# Patient Record
Sex: Female | Born: 1935 | Race: White | Hispanic: No | Smoking: Former smoker
Health system: Southern US, Community
[De-identification: ages and names within clinical notes are randomized; demographics above are authoritative.]

## PROBLEM LIST (undated history)

## (undated) DIAGNOSIS — K449 Diaphragmatic hernia without obstruction or gangrene: Secondary | ICD-10-CM

## (undated) DIAGNOSIS — K219 Gastro-esophageal reflux disease without esophagitis: Secondary | ICD-10-CM

## (undated) DIAGNOSIS — K7581 Nonalcoholic steatohepatitis (NASH): Secondary | ICD-10-CM

## (undated) DIAGNOSIS — K623 Rectal prolapse: Secondary | ICD-10-CM

## (undated) DIAGNOSIS — I639 Cerebral infarction, unspecified: Secondary | ICD-10-CM

## (undated) DIAGNOSIS — I83019 Varicose veins of right lower extremity with ulcer of unspecified site: Secondary | ICD-10-CM

## (undated) DIAGNOSIS — R7303 Prediabetes: Secondary | ICD-10-CM

## (undated) DIAGNOSIS — E785 Hyperlipidemia, unspecified: Secondary | ICD-10-CM

## (undated) DIAGNOSIS — I8500 Esophageal varices without bleeding: Secondary | ICD-10-CM

## (undated) DIAGNOSIS — D509 Iron deficiency anemia, unspecified: Secondary | ICD-10-CM

## (undated) DIAGNOSIS — I10 Essential (primary) hypertension: Secondary | ICD-10-CM

## (undated) DIAGNOSIS — Z9282 Status post administration of tPA (rtPA) in a different facility within the last 24 hours prior to admission to current facility: Secondary | ICD-10-CM

## (undated) DIAGNOSIS — R339 Retention of urine, unspecified: Secondary | ICD-10-CM

## (undated) DIAGNOSIS — R270 Ataxia, unspecified: Secondary | ICD-10-CM

## (undated) DIAGNOSIS — L97919 Non-pressure chronic ulcer of unspecified part of right lower leg with unspecified severity: Secondary | ICD-10-CM

## (undated) DIAGNOSIS — K5901 Slow transit constipation: Secondary | ICD-10-CM

## (undated) DIAGNOSIS — I6622 Occlusion and stenosis of left posterior cerebral artery: Secondary | ICD-10-CM

## (undated) DIAGNOSIS — N390 Urinary tract infection, site not specified: Secondary | ICD-10-CM

## (undated) DIAGNOSIS — E039 Hypothyroidism, unspecified: Secondary | ICD-10-CM

## (undated) DIAGNOSIS — J441 Chronic obstructive pulmonary disease with (acute) exacerbation: Secondary | ICD-10-CM

## (undated) DIAGNOSIS — M1990 Unspecified osteoarthritis, unspecified site: Secondary | ICD-10-CM

## (undated) DIAGNOSIS — E8809 Other disorders of plasma-protein metabolism, not elsewhere classified: Secondary | ICD-10-CM

## (undated) DIAGNOSIS — K7460 Unspecified cirrhosis of liver: Secondary | ICD-10-CM

## (undated) DIAGNOSIS — E46 Unspecified protein-calorie malnutrition: Secondary | ICD-10-CM

## (undated) DIAGNOSIS — R3129 Other microscopic hematuria: Secondary | ICD-10-CM

## (undated) DIAGNOSIS — G629 Polyneuropathy, unspecified: Secondary | ICD-10-CM

## (undated) DIAGNOSIS — Z86010 Personal history of colonic polyps: Secondary | ICD-10-CM

## (undated) DIAGNOSIS — K5790 Diverticulosis of intestine, part unspecified, without perforation or abscess without bleeding: Secondary | ICD-10-CM

## (undated) DIAGNOSIS — K754 Autoimmune hepatitis: Secondary | ICD-10-CM

## (undated) DIAGNOSIS — D649 Anemia, unspecified: Secondary | ICD-10-CM

## (undated) DIAGNOSIS — K31819 Angiodysplasia of stomach and duodenum without bleeding: Secondary | ICD-10-CM

## (undated) DIAGNOSIS — I509 Heart failure, unspecified: Secondary | ICD-10-CM

## (undated) DIAGNOSIS — R1110 Vomiting, unspecified: Secondary | ICD-10-CM

## (undated) DIAGNOSIS — B159 Hepatitis A without hepatic coma: Secondary | ICD-10-CM

## (undated) DIAGNOSIS — F32A Depression, unspecified: Secondary | ICD-10-CM

## (undated) DIAGNOSIS — J449 Chronic obstructive pulmonary disease, unspecified: Secondary | ICD-10-CM

## (undated) DIAGNOSIS — N182 Chronic kidney disease, stage 2 (mild): Secondary | ICD-10-CM

## (undated) DIAGNOSIS — R195 Other fecal abnormalities: Secondary | ICD-10-CM

## (undated) DIAGNOSIS — D638 Anemia in other chronic diseases classified elsewhere: Secondary | ICD-10-CM

## (undated) HISTORY — DX: Essential (primary) hypertension: I10

## (undated) HISTORY — DX: Unspecified cirrhosis of liver: K74.60

## (undated) HISTORY — PX: POLYPECTOMY: SHX149

## (undated) HISTORY — PX: OTHER SURGICAL HISTORY: SHX169

## (undated) HISTORY — DX: Status post administration of tPA (rtPA) in a different facility within the last 24 hours prior to admission to current facility: Z92.82

## (undated) HISTORY — DX: Autoimmune hepatitis: K75.4

## (undated) HISTORY — DX: Chronic obstructive pulmonary disease, unspecified: J44.9

## (undated) HISTORY — PX: AMPUTATION TOE: SHX6595

## (undated) HISTORY — DX: Retention of urine, unspecified: R33.9

## (undated) HISTORY — DX: Angiodysplasia of stomach and duodenum without bleeding: K31.819

## (undated) HISTORY — DX: Hypothyroidism, unspecified: E03.9

## (undated) HISTORY — DX: Diaphragmatic hernia without obstruction or gangrene: K44.9

## (undated) HISTORY — DX: Esophageal varices without bleeding: I85.00

## (undated) HISTORY — DX: Chronic kidney disease, stage 2 (mild): N18.2

## (undated) HISTORY — DX: Other fecal abnormalities: R19.5

## (undated) HISTORY — DX: Personal history of colonic polyps: Z86.010

## (undated) HISTORY — DX: Varicose veins of right lower extremity with ulcer of unspecified site: I83.019

## (undated) HISTORY — DX: Anemia in other chronic diseases classified elsewhere: D63.8

## (undated) HISTORY — DX: Vomiting, unspecified: R11.10

## (undated) HISTORY — PX: BREAST SURGERY: SHX581

## (undated) HISTORY — DX: Rectal prolapse: K62.3

## (undated) HISTORY — DX: Occlusion and stenosis of left posterior cerebral artery: I66.22

## (undated) HISTORY — DX: Varicose veins of right lower extremity with ulcer of unspecified site: L97.919

## (undated) HISTORY — DX: Iron deficiency anemia, unspecified: D50.9

## (undated) HISTORY — DX: Cerebral infarction, unspecified: I63.9

## (undated) HISTORY — DX: Prediabetes: R73.03

## (undated) HISTORY — DX: Chronic obstructive pulmonary disease with (acute) exacerbation: J44.1

## (undated) HISTORY — DX: Unspecified protein-calorie malnutrition: E46

## (undated) HISTORY — DX: Other disorders of plasma-protein metabolism, not elsewhere classified: E88.09

## (undated) HISTORY — DX: Heart failure, unspecified: I50.9

## (undated) HISTORY — DX: Slow transit constipation: K59.01

## (undated) HISTORY — DX: Polyneuropathy, unspecified: G62.9

## (undated) HISTORY — DX: Hyperlipidemia, unspecified: E78.5

## (undated) HISTORY — DX: Ataxia, unspecified: R27.0

## (undated) HISTORY — DX: Depression, unspecified: F32.A

---
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
## 1961-06-23 DIAGNOSIS — B159 Hepatitis A without hepatic coma: Secondary | ICD-10-CM

## 1961-06-23 HISTORY — DX: Hepatitis a without hepatic coma: B15.9

## 2010-06-23 HISTORY — PX: ABDOMINAL HYSTERECTOMY: SHX81

## 2011-06-24 HISTORY — PX: CHOLECYSTECTOMY: SHX55

## 2011-09-04 DIAGNOSIS — J449 Chronic obstructive pulmonary disease, unspecified: Secondary | ICD-10-CM | POA: Diagnosis not present

## 2011-09-04 DIAGNOSIS — E782 Mixed hyperlipidemia: Secondary | ICD-10-CM | POA: Diagnosis not present

## 2011-09-04 DIAGNOSIS — L570 Actinic keratosis: Secondary | ICD-10-CM | POA: Diagnosis not present

## 2011-09-04 DIAGNOSIS — I10 Essential (primary) hypertension: Secondary | ICD-10-CM | POA: Diagnosis not present

## 2011-09-17 DIAGNOSIS — L570 Actinic keratosis: Secondary | ICD-10-CM | POA: Diagnosis not present

## 2011-10-02 DIAGNOSIS — H524 Presbyopia: Secondary | ICD-10-CM | POA: Diagnosis not present

## 2011-10-30 DIAGNOSIS — J3089 Other allergic rhinitis: Secondary | ICD-10-CM | POA: Diagnosis not present

## 2011-10-30 DIAGNOSIS — G4700 Insomnia, unspecified: Secondary | ICD-10-CM | POA: Diagnosis not present

## 2011-10-30 DIAGNOSIS — Z7189 Other specified counseling: Secondary | ICD-10-CM | POA: Diagnosis not present

## 2011-11-13 DIAGNOSIS — J310 Chronic rhinitis: Secondary | ICD-10-CM | POA: Diagnosis not present

## 2011-11-13 DIAGNOSIS — J449 Chronic obstructive pulmonary disease, unspecified: Secondary | ICD-10-CM | POA: Diagnosis not present

## 2012-01-01 DIAGNOSIS — J449 Chronic obstructive pulmonary disease, unspecified: Secondary | ICD-10-CM | POA: Diagnosis not present

## 2012-01-06 DIAGNOSIS — Z23 Encounter for immunization: Secondary | ICD-10-CM | POA: Diagnosis not present

## 2012-01-06 DIAGNOSIS — I10 Essential (primary) hypertension: Secondary | ICD-10-CM | POA: Diagnosis not present

## 2012-01-06 DIAGNOSIS — E782 Mixed hyperlipidemia: Secondary | ICD-10-CM | POA: Diagnosis not present

## 2012-01-26 DIAGNOSIS — Z79899 Other long term (current) drug therapy: Secondary | ICD-10-CM | POA: Diagnosis not present

## 2012-02-06 DIAGNOSIS — K8020 Calculus of gallbladder without cholecystitis without obstruction: Secondary | ICD-10-CM | POA: Diagnosis not present

## 2012-02-06 DIAGNOSIS — R7989 Other specified abnormal findings of blood chemistry: Secondary | ICD-10-CM | POA: Diagnosis not present

## 2012-02-06 DIAGNOSIS — R748 Abnormal levels of other serum enzymes: Secondary | ICD-10-CM | POA: Diagnosis not present

## 2012-02-06 DIAGNOSIS — R160 Hepatomegaly, not elsewhere classified: Secondary | ICD-10-CM | POA: Diagnosis not present

## 2012-02-13 DIAGNOSIS — R1901 Right upper quadrant abdominal swelling, mass and lump: Secondary | ICD-10-CM | POA: Diagnosis not present

## 2012-02-13 DIAGNOSIS — K8020 Calculus of gallbladder without cholecystitis without obstruction: Secondary | ICD-10-CM | POA: Diagnosis not present

## 2012-02-19 DIAGNOSIS — J4489 Other specified chronic obstructive pulmonary disease: Secondary | ICD-10-CM | POA: Diagnosis not present

## 2012-02-19 DIAGNOSIS — J449 Chronic obstructive pulmonary disease, unspecified: Secondary | ICD-10-CM | POA: Diagnosis not present

## 2012-02-19 DIAGNOSIS — Z0000 Encounter for general adult medical examination without abnormal findings: Secondary | ICD-10-CM | POA: Diagnosis not present

## 2012-02-19 DIAGNOSIS — Z1231 Encounter for screening mammogram for malignant neoplasm of breast: Secondary | ICD-10-CM | POA: Diagnosis not present

## 2012-02-19 DIAGNOSIS — Z1210 Encounter for screening for malignant neoplasm of intestinal tract, unspecified: Secondary | ICD-10-CM | POA: Diagnosis not present

## 2012-02-19 DIAGNOSIS — K8010 Calculus of gallbladder with chronic cholecystitis without obstruction: Secondary | ICD-10-CM | POA: Diagnosis not present

## 2012-02-19 DIAGNOSIS — K8070 Calculus of gallbladder and bile duct without cholecystitis without obstruction: Secondary | ICD-10-CM | POA: Diagnosis not present

## 2012-02-20 DIAGNOSIS — J449 Chronic obstructive pulmonary disease, unspecified: Secondary | ICD-10-CM | POA: Diagnosis not present

## 2012-02-20 DIAGNOSIS — R9431 Abnormal electrocardiogram [ECG] [EKG]: Secondary | ICD-10-CM | POA: Diagnosis not present

## 2012-02-20 DIAGNOSIS — K8020 Calculus of gallbladder without cholecystitis without obstruction: Secondary | ICD-10-CM | POA: Diagnosis not present

## 2012-02-20 DIAGNOSIS — Z01818 Encounter for other preprocedural examination: Secondary | ICD-10-CM | POA: Diagnosis not present

## 2012-02-20 DIAGNOSIS — I10 Essential (primary) hypertension: Secondary | ICD-10-CM | POA: Diagnosis not present

## 2012-02-27 DIAGNOSIS — I10 Essential (primary) hypertension: Secondary | ICD-10-CM | POA: Diagnosis not present

## 2012-02-27 DIAGNOSIS — K8010 Calculus of gallbladder with chronic cholecystitis without obstruction: Secondary | ICD-10-CM | POA: Diagnosis not present

## 2012-02-27 DIAGNOSIS — J449 Chronic obstructive pulmonary disease, unspecified: Secondary | ICD-10-CM | POA: Diagnosis not present

## 2012-02-27 DIAGNOSIS — K819 Cholecystitis, unspecified: Secondary | ICD-10-CM | POA: Diagnosis not present

## 2012-02-27 DIAGNOSIS — E785 Hyperlipidemia, unspecified: Secondary | ICD-10-CM | POA: Diagnosis not present

## 2012-02-27 DIAGNOSIS — Z87891 Personal history of nicotine dependence: Secondary | ICD-10-CM | POA: Diagnosis not present

## 2012-03-19 DIAGNOSIS — K759 Inflammatory liver disease, unspecified: Secondary | ICD-10-CM | POA: Diagnosis not present

## 2012-03-19 DIAGNOSIS — Z1231 Encounter for screening mammogram for malignant neoplasm of breast: Secondary | ICD-10-CM | POA: Diagnosis not present

## 2012-03-19 DIAGNOSIS — Z23 Encounter for immunization: Secondary | ICD-10-CM | POA: Diagnosis not present

## 2012-05-10 DIAGNOSIS — E559 Vitamin D deficiency, unspecified: Secondary | ICD-10-CM | POA: Diagnosis not present

## 2012-05-10 DIAGNOSIS — E782 Mixed hyperlipidemia: Secondary | ICD-10-CM | POA: Diagnosis not present

## 2012-05-10 DIAGNOSIS — I10 Essential (primary) hypertension: Secondary | ICD-10-CM | POA: Diagnosis not present

## 2012-05-10 DIAGNOSIS — J449 Chronic obstructive pulmonary disease, unspecified: Secondary | ICD-10-CM | POA: Diagnosis not present

## 2012-05-31 DIAGNOSIS — E559 Vitamin D deficiency, unspecified: Secondary | ICD-10-CM | POA: Diagnosis not present

## 2012-05-31 DIAGNOSIS — E782 Mixed hyperlipidemia: Secondary | ICD-10-CM | POA: Diagnosis not present

## 2012-08-16 DIAGNOSIS — E782 Mixed hyperlipidemia: Secondary | ICD-10-CM | POA: Diagnosis not present

## 2012-08-16 DIAGNOSIS — J449 Chronic obstructive pulmonary disease, unspecified: Secondary | ICD-10-CM | POA: Diagnosis not present

## 2012-09-03 DIAGNOSIS — E785 Hyperlipidemia, unspecified: Secondary | ICD-10-CM | POA: Diagnosis not present

## 2012-09-03 DIAGNOSIS — F411 Generalized anxiety disorder: Secondary | ICD-10-CM | POA: Diagnosis not present

## 2013-01-03 DIAGNOSIS — E782 Mixed hyperlipidemia: Secondary | ICD-10-CM | POA: Diagnosis not present

## 2013-01-03 DIAGNOSIS — I10 Essential (primary) hypertension: Secondary | ICD-10-CM | POA: Diagnosis not present

## 2013-02-02 DIAGNOSIS — Z1331 Encounter for screening for depression: Secondary | ICD-10-CM | POA: Diagnosis not present

## 2013-02-02 DIAGNOSIS — I10 Essential (primary) hypertension: Secondary | ICD-10-CM | POA: Diagnosis not present

## 2013-02-02 DIAGNOSIS — Z139 Encounter for screening, unspecified: Secondary | ICD-10-CM | POA: Diagnosis not present

## 2013-02-02 DIAGNOSIS — E782 Mixed hyperlipidemia: Secondary | ICD-10-CM | POA: Diagnosis not present

## 2013-02-02 DIAGNOSIS — J449 Chronic obstructive pulmonary disease, unspecified: Secondary | ICD-10-CM | POA: Diagnosis not present

## 2013-02-15 DIAGNOSIS — J449 Chronic obstructive pulmonary disease, unspecified: Secondary | ICD-10-CM | POA: Diagnosis not present

## 2013-03-03 DIAGNOSIS — Z23 Encounter for immunization: Secondary | ICD-10-CM | POA: Diagnosis not present

## 2013-03-14 DIAGNOSIS — D485 Neoplasm of uncertain behavior of skin: Secondary | ICD-10-CM | POA: Diagnosis not present

## 2013-03-14 DIAGNOSIS — L578 Other skin changes due to chronic exposure to nonionizing radiation: Secondary | ICD-10-CM | POA: Diagnosis not present

## 2013-03-14 DIAGNOSIS — L570 Actinic keratosis: Secondary | ICD-10-CM | POA: Diagnosis not present

## 2013-04-20 DIAGNOSIS — C4490 Unspecified malignant neoplasm of skin, unspecified: Secondary | ICD-10-CM | POA: Diagnosis not present

## 2013-07-05 DIAGNOSIS — I10 Essential (primary) hypertension: Secondary | ICD-10-CM | POA: Diagnosis not present

## 2013-07-05 DIAGNOSIS — E782 Mixed hyperlipidemia: Secondary | ICD-10-CM | POA: Diagnosis not present

## 2013-07-21 DIAGNOSIS — E782 Mixed hyperlipidemia: Secondary | ICD-10-CM | POA: Diagnosis not present

## 2013-07-21 DIAGNOSIS — Z1331 Encounter for screening for depression: Secondary | ICD-10-CM | POA: Diagnosis not present

## 2013-07-21 DIAGNOSIS — I10 Essential (primary) hypertension: Secondary | ICD-10-CM | POA: Diagnosis not present

## 2013-07-21 DIAGNOSIS — IMO0002 Reserved for concepts with insufficient information to code with codable children: Secondary | ICD-10-CM | POA: Diagnosis not present

## 2013-07-21 DIAGNOSIS — Z139 Encounter for screening, unspecified: Secondary | ICD-10-CM | POA: Diagnosis not present

## 2013-07-21 DIAGNOSIS — Z0000 Encounter for general adult medical examination without abnormal findings: Secondary | ICD-10-CM | POA: Diagnosis not present

## 2013-07-21 DIAGNOSIS — J449 Chronic obstructive pulmonary disease, unspecified: Secondary | ICD-10-CM | POA: Diagnosis not present

## 2013-11-10 DIAGNOSIS — I10 Essential (primary) hypertension: Secondary | ICD-10-CM | POA: Diagnosis not present

## 2013-11-10 DIAGNOSIS — E782 Mixed hyperlipidemia: Secondary | ICD-10-CM | POA: Diagnosis not present

## 2013-11-17 DIAGNOSIS — IMO0002 Reserved for concepts with insufficient information to code with codable children: Secondary | ICD-10-CM | POA: Diagnosis not present

## 2013-11-17 DIAGNOSIS — E782 Mixed hyperlipidemia: Secondary | ICD-10-CM | POA: Diagnosis not present

## 2013-11-17 DIAGNOSIS — R748 Abnormal levels of other serum enzymes: Secondary | ICD-10-CM | POA: Diagnosis not present

## 2013-11-17 DIAGNOSIS — I10 Essential (primary) hypertension: Secondary | ICD-10-CM | POA: Diagnosis not present

## 2013-11-21 DIAGNOSIS — IMO0002 Reserved for concepts with insufficient information to code with codable children: Secondary | ICD-10-CM | POA: Diagnosis not present

## 2013-11-21 DIAGNOSIS — N390 Urinary tract infection, site not specified: Secondary | ICD-10-CM | POA: Diagnosis not present

## 2013-11-21 DIAGNOSIS — G589 Mononeuropathy, unspecified: Secondary | ICD-10-CM | POA: Diagnosis not present

## 2013-11-21 DIAGNOSIS — N8111 Cystocele, midline: Secondary | ICD-10-CM | POA: Diagnosis not present

## 2013-12-01 DIAGNOSIS — M47814 Spondylosis without myelopathy or radiculopathy, thoracic region: Secondary | ICD-10-CM | POA: Diagnosis not present

## 2013-12-01 DIAGNOSIS — M546 Pain in thoracic spine: Secondary | ICD-10-CM | POA: Diagnosis not present

## 2013-12-01 DIAGNOSIS — IMO0002 Reserved for concepts with insufficient information to code with codable children: Secondary | ICD-10-CM | POA: Diagnosis not present

## 2013-12-08 DIAGNOSIS — IMO0002 Reserved for concepts with insufficient information to code with codable children: Secondary | ICD-10-CM | POA: Diagnosis not present

## 2014-03-29 DIAGNOSIS — Z23 Encounter for immunization: Secondary | ICD-10-CM | POA: Diagnosis not present

## 2014-04-12 DIAGNOSIS — E782 Mixed hyperlipidemia: Secondary | ICD-10-CM | POA: Diagnosis not present

## 2014-04-12 DIAGNOSIS — I10 Essential (primary) hypertension: Secondary | ICD-10-CM | POA: Diagnosis not present

## 2014-04-19 DIAGNOSIS — J449 Chronic obstructive pulmonary disease, unspecified: Secondary | ICD-10-CM | POA: Diagnosis not present

## 2014-04-19 DIAGNOSIS — J411 Mucopurulent chronic bronchitis: Secondary | ICD-10-CM | POA: Diagnosis not present

## 2014-04-19 DIAGNOSIS — Z6820 Body mass index (BMI) 20.0-20.9, adult: Secondary | ICD-10-CM | POA: Diagnosis not present

## 2014-05-30 DIAGNOSIS — Z1211 Encounter for screening for malignant neoplasm of colon: Secondary | ICD-10-CM | POA: Diagnosis not present

## 2014-05-30 DIAGNOSIS — Z86010 Personal history of colonic polyps: Secondary | ICD-10-CM | POA: Diagnosis not present

## 2014-07-11 DIAGNOSIS — Z6820 Body mass index (BMI) 20.0-20.9, adult: Secondary | ICD-10-CM | POA: Diagnosis not present

## 2014-07-11 DIAGNOSIS — R300 Dysuria: Secondary | ICD-10-CM | POA: Diagnosis not present

## 2014-07-11 DIAGNOSIS — N390 Urinary tract infection, site not specified: Secondary | ICD-10-CM | POA: Diagnosis not present

## 2014-07-12 DIAGNOSIS — Z1211 Encounter for screening for malignant neoplasm of colon: Secondary | ICD-10-CM | POA: Diagnosis not present

## 2014-07-12 DIAGNOSIS — K5730 Diverticulosis of large intestine without perforation or abscess without bleeding: Secondary | ICD-10-CM | POA: Diagnosis not present

## 2014-07-12 DIAGNOSIS — Z86010 Personal history of colonic polyps: Secondary | ICD-10-CM | POA: Diagnosis not present

## 2014-07-12 DIAGNOSIS — D122 Benign neoplasm of ascending colon: Secondary | ICD-10-CM | POA: Diagnosis not present

## 2014-07-12 DIAGNOSIS — J45909 Unspecified asthma, uncomplicated: Secondary | ICD-10-CM | POA: Diagnosis not present

## 2014-07-12 DIAGNOSIS — D123 Benign neoplasm of transverse colon: Secondary | ICD-10-CM | POA: Diagnosis not present

## 2014-07-12 DIAGNOSIS — Z79899 Other long term (current) drug therapy: Secondary | ICD-10-CM | POA: Diagnosis not present

## 2014-07-12 DIAGNOSIS — I10 Essential (primary) hypertension: Secondary | ICD-10-CM | POA: Diagnosis not present

## 2014-07-12 DIAGNOSIS — J449 Chronic obstructive pulmonary disease, unspecified: Secondary | ICD-10-CM | POA: Diagnosis not present

## 2014-07-12 HISTORY — PX: COLONOSCOPY: SHX174

## 2014-08-10 DIAGNOSIS — E782 Mixed hyperlipidemia: Secondary | ICD-10-CM | POA: Diagnosis not present

## 2014-08-10 DIAGNOSIS — I10 Essential (primary) hypertension: Secondary | ICD-10-CM | POA: Diagnosis not present

## 2014-08-16 DIAGNOSIS — R748 Abnormal levels of other serum enzymes: Secondary | ICD-10-CM | POA: Diagnosis not present

## 2014-08-16 DIAGNOSIS — Z8619 Personal history of other infectious and parasitic diseases: Secondary | ICD-10-CM | POA: Diagnosis not present

## 2014-08-16 DIAGNOSIS — Z6820 Body mass index (BMI) 20.0-20.9, adult: Secondary | ICD-10-CM | POA: Diagnosis not present

## 2014-09-15 DIAGNOSIS — N3001 Acute cystitis with hematuria: Secondary | ICD-10-CM | POA: Diagnosis not present

## 2014-09-15 DIAGNOSIS — N3090 Cystitis, unspecified without hematuria: Secondary | ICD-10-CM | POA: Diagnosis not present

## 2014-12-06 DIAGNOSIS — I10 Essential (primary) hypertension: Secondary | ICD-10-CM | POA: Diagnosis not present

## 2014-12-06 DIAGNOSIS — Z139 Encounter for screening, unspecified: Secondary | ICD-10-CM | POA: Diagnosis not present

## 2014-12-06 DIAGNOSIS — Z0000 Encounter for general adult medical examination without abnormal findings: Secondary | ICD-10-CM | POA: Diagnosis not present

## 2014-12-06 DIAGNOSIS — Z1389 Encounter for screening for other disorder: Secondary | ICD-10-CM | POA: Diagnosis not present

## 2014-12-06 DIAGNOSIS — E782 Mixed hyperlipidemia: Secondary | ICD-10-CM | POA: Diagnosis not present

## 2014-12-14 DIAGNOSIS — Z1231 Encounter for screening mammogram for malignant neoplasm of breast: Secondary | ICD-10-CM | POA: Diagnosis not present

## 2014-12-18 DIAGNOSIS — E782 Mixed hyperlipidemia: Secondary | ICD-10-CM | POA: Diagnosis not present

## 2014-12-18 DIAGNOSIS — Z6820 Body mass index (BMI) 20.0-20.9, adult: Secondary | ICD-10-CM | POA: Diagnosis not present

## 2014-12-18 DIAGNOSIS — K623 Rectal prolapse: Secondary | ICD-10-CM | POA: Diagnosis not present

## 2014-12-18 DIAGNOSIS — I10 Essential (primary) hypertension: Secondary | ICD-10-CM | POA: Diagnosis not present

## 2014-12-29 DIAGNOSIS — Z6820 Body mass index (BMI) 20.0-20.9, adult: Secondary | ICD-10-CM | POA: Diagnosis not present

## 2014-12-29 DIAGNOSIS — J441 Chronic obstructive pulmonary disease with (acute) exacerbation: Secondary | ICD-10-CM | POA: Diagnosis not present

## 2015-02-22 DIAGNOSIS — N390 Urinary tract infection, site not specified: Secondary | ICD-10-CM | POA: Diagnosis not present

## 2015-02-22 DIAGNOSIS — R300 Dysuria: Secondary | ICD-10-CM | POA: Diagnosis not present

## 2015-02-22 DIAGNOSIS — Z6820 Body mass index (BMI) 20.0-20.9, adult: Secondary | ICD-10-CM | POA: Diagnosis not present

## 2015-03-08 DIAGNOSIS — H25813 Combined forms of age-related cataract, bilateral: Secondary | ICD-10-CM | POA: Diagnosis not present

## 2015-03-13 DIAGNOSIS — I10 Essential (primary) hypertension: Secondary | ICD-10-CM | POA: Diagnosis not present

## 2015-03-13 DIAGNOSIS — E782 Mixed hyperlipidemia: Secondary | ICD-10-CM | POA: Diagnosis not present

## 2015-03-20 DIAGNOSIS — I10 Essential (primary) hypertension: Secondary | ICD-10-CM | POA: Diagnosis not present

## 2015-03-20 DIAGNOSIS — N390 Urinary tract infection, site not specified: Secondary | ICD-10-CM | POA: Diagnosis not present

## 2015-03-20 DIAGNOSIS — R748 Abnormal levels of other serum enzymes: Secondary | ICD-10-CM | POA: Diagnosis not present

## 2015-03-20 DIAGNOSIS — Z23 Encounter for immunization: Secondary | ICD-10-CM | POA: Diagnosis not present

## 2015-03-20 DIAGNOSIS — J449 Chronic obstructive pulmonary disease, unspecified: Secondary | ICD-10-CM | POA: Diagnosis not present

## 2015-03-20 DIAGNOSIS — E785 Hyperlipidemia, unspecified: Secondary | ICD-10-CM | POA: Diagnosis not present

## 2015-03-26 DIAGNOSIS — Z9049 Acquired absence of other specified parts of digestive tract: Secondary | ICD-10-CM | POA: Diagnosis not present

## 2015-03-26 DIAGNOSIS — K869 Disease of pancreas, unspecified: Secondary | ICD-10-CM | POA: Diagnosis not present

## 2015-03-26 DIAGNOSIS — I700 Atherosclerosis of aorta: Secondary | ICD-10-CM | POA: Diagnosis not present

## 2015-03-26 DIAGNOSIS — R748 Abnormal levels of other serum enzymes: Secondary | ICD-10-CM | POA: Diagnosis not present

## 2015-03-26 DIAGNOSIS — I862 Pelvic varices: Secondary | ICD-10-CM | POA: Diagnosis not present

## 2015-03-26 DIAGNOSIS — R7989 Other specified abnormal findings of blood chemistry: Secondary | ICD-10-CM | POA: Diagnosis not present

## 2015-03-26 DIAGNOSIS — R945 Abnormal results of liver function studies: Secondary | ICD-10-CM | POA: Diagnosis not present

## 2015-03-26 DIAGNOSIS — Z90710 Acquired absence of both cervix and uterus: Secondary | ICD-10-CM | POA: Diagnosis not present

## 2015-04-03 DIAGNOSIS — Z23 Encounter for immunization: Secondary | ICD-10-CM | POA: Diagnosis not present

## 2015-04-03 DIAGNOSIS — R7989 Other specified abnormal findings of blood chemistry: Secondary | ICD-10-CM | POA: Diagnosis not present

## 2015-04-03 DIAGNOSIS — K7460 Unspecified cirrhosis of liver: Secondary | ICD-10-CM | POA: Diagnosis not present

## 2015-04-03 DIAGNOSIS — Z6820 Body mass index (BMI) 20.0-20.9, adult: Secondary | ICD-10-CM | POA: Diagnosis not present

## 2015-05-15 DIAGNOSIS — K7460 Unspecified cirrhosis of liver: Secondary | ICD-10-CM | POA: Diagnosis not present

## 2015-06-07 DIAGNOSIS — Z23 Encounter for immunization: Secondary | ICD-10-CM | POA: Diagnosis not present

## 2015-07-31 DIAGNOSIS — K7460 Unspecified cirrhosis of liver: Secondary | ICD-10-CM | POA: Diagnosis not present

## 2015-07-31 DIAGNOSIS — R5383 Other fatigue: Secondary | ICD-10-CM | POA: Diagnosis not present

## 2015-07-31 DIAGNOSIS — E785 Hyperlipidemia, unspecified: Secondary | ICD-10-CM | POA: Diagnosis not present

## 2015-08-23 DIAGNOSIS — R5383 Other fatigue: Secondary | ICD-10-CM | POA: Diagnosis not present

## 2015-08-23 DIAGNOSIS — F329 Major depressive disorder, single episode, unspecified: Secondary | ICD-10-CM | POA: Diagnosis not present

## 2015-08-23 DIAGNOSIS — N390 Urinary tract infection, site not specified: Secondary | ICD-10-CM | POA: Diagnosis not present

## 2015-08-23 DIAGNOSIS — F419 Anxiety disorder, unspecified: Secondary | ICD-10-CM | POA: Diagnosis not present

## 2015-08-23 DIAGNOSIS — Z1389 Encounter for screening for other disorder: Secondary | ICD-10-CM | POA: Diagnosis not present

## 2015-08-29 DIAGNOSIS — E782 Mixed hyperlipidemia: Secondary | ICD-10-CM | POA: Diagnosis not present

## 2015-08-29 DIAGNOSIS — D509 Iron deficiency anemia, unspecified: Secondary | ICD-10-CM | POA: Diagnosis not present

## 2015-09-11 DIAGNOSIS — I10 Essential (primary) hypertension: Secondary | ICD-10-CM | POA: Diagnosis not present

## 2015-09-11 DIAGNOSIS — J339 Nasal polyp, unspecified: Secondary | ICD-10-CM | POA: Diagnosis not present

## 2015-09-11 DIAGNOSIS — Z6821 Body mass index (BMI) 21.0-21.9, adult: Secondary | ICD-10-CM | POA: Diagnosis not present

## 2015-09-11 DIAGNOSIS — M21611 Bunion of right foot: Secondary | ICD-10-CM | POA: Diagnosis not present

## 2015-09-18 DIAGNOSIS — D509 Iron deficiency anemia, unspecified: Secondary | ICD-10-CM | POA: Diagnosis not present

## 2015-09-20 DIAGNOSIS — M2041 Other hammer toe(s) (acquired), right foot: Secondary | ICD-10-CM

## 2015-09-20 DIAGNOSIS — M2011 Hallux valgus (acquired), right foot: Secondary | ICD-10-CM | POA: Diagnosis not present

## 2015-09-20 DIAGNOSIS — M2012 Hallux valgus (acquired), left foot: Secondary | ICD-10-CM | POA: Diagnosis not present

## 2015-09-20 HISTORY — DX: Other hammer toe(s) (acquired), right foot: M20.41

## 2015-09-21 DIAGNOSIS — I10 Essential (primary) hypertension: Secondary | ICD-10-CM | POA: Diagnosis not present

## 2015-09-21 DIAGNOSIS — R040 Epistaxis: Secondary | ICD-10-CM | POA: Diagnosis not present

## 2015-09-28 DIAGNOSIS — I10 Essential (primary) hypertension: Secondary | ICD-10-CM | POA: Diagnosis not present

## 2015-09-28 DIAGNOSIS — R040 Epistaxis: Secondary | ICD-10-CM | POA: Diagnosis not present

## 2015-10-02 DIAGNOSIS — R040 Epistaxis: Secondary | ICD-10-CM | POA: Diagnosis not present

## 2015-10-09 DIAGNOSIS — Z23 Encounter for immunization: Secondary | ICD-10-CM | POA: Diagnosis not present

## 2015-10-19 DIAGNOSIS — I10 Essential (primary) hypertension: Secondary | ICD-10-CM | POA: Diagnosis not present

## 2015-10-19 DIAGNOSIS — R040 Epistaxis: Secondary | ICD-10-CM | POA: Diagnosis not present

## 2015-10-24 DIAGNOSIS — E782 Mixed hyperlipidemia: Secondary | ICD-10-CM | POA: Diagnosis not present

## 2015-10-30 DIAGNOSIS — K7460 Unspecified cirrhosis of liver: Secondary | ICD-10-CM | POA: Diagnosis not present

## 2015-10-31 DIAGNOSIS — Z1389 Encounter for screening for other disorder: Secondary | ICD-10-CM | POA: Diagnosis not present

## 2015-10-31 DIAGNOSIS — N182 Chronic kidney disease, stage 2 (mild): Secondary | ICD-10-CM | POA: Diagnosis not present

## 2015-10-31 DIAGNOSIS — E782 Mixed hyperlipidemia: Secondary | ICD-10-CM | POA: Diagnosis not present

## 2015-10-31 DIAGNOSIS — I129 Hypertensive chronic kidney disease with stage 1 through stage 4 chronic kidney disease, or unspecified chronic kidney disease: Secondary | ICD-10-CM | POA: Diagnosis not present

## 2015-10-31 DIAGNOSIS — K7460 Unspecified cirrhosis of liver: Secondary | ICD-10-CM | POA: Diagnosis not present

## 2015-11-02 DIAGNOSIS — R040 Epistaxis: Secondary | ICD-10-CM | POA: Diagnosis not present

## 2015-11-02 DIAGNOSIS — I10 Essential (primary) hypertension: Secondary | ICD-10-CM | POA: Diagnosis not present

## 2015-11-14 DIAGNOSIS — K2950 Unspecified chronic gastritis without bleeding: Secondary | ICD-10-CM | POA: Diagnosis not present

## 2015-11-14 DIAGNOSIS — K317 Polyp of stomach and duodenum: Secondary | ICD-10-CM | POA: Diagnosis not present

## 2015-11-14 DIAGNOSIS — K210 Gastro-esophageal reflux disease with esophagitis: Secondary | ICD-10-CM | POA: Diagnosis not present

## 2015-11-14 DIAGNOSIS — D131 Benign neoplasm of stomach: Secondary | ICD-10-CM | POA: Diagnosis not present

## 2015-11-14 DIAGNOSIS — F419 Anxiety disorder, unspecified: Secondary | ICD-10-CM | POA: Diagnosis not present

## 2015-11-14 DIAGNOSIS — G43909 Migraine, unspecified, not intractable, without status migrainosus: Secondary | ICD-10-CM | POA: Diagnosis not present

## 2015-11-14 DIAGNOSIS — Z87891 Personal history of nicotine dependence: Secondary | ICD-10-CM | POA: Diagnosis not present

## 2015-11-14 DIAGNOSIS — F329 Major depressive disorder, single episode, unspecified: Secondary | ICD-10-CM | POA: Diagnosis not present

## 2015-11-14 DIAGNOSIS — I10 Essential (primary) hypertension: Secondary | ICD-10-CM | POA: Diagnosis not present

## 2015-11-14 DIAGNOSIS — E785 Hyperlipidemia, unspecified: Secondary | ICD-10-CM | POA: Diagnosis not present

## 2015-11-14 DIAGNOSIS — J449 Chronic obstructive pulmonary disease, unspecified: Secondary | ICD-10-CM | POA: Diagnosis not present

## 2015-11-14 DIAGNOSIS — K2900 Acute gastritis without bleeding: Secondary | ICD-10-CM | POA: Diagnosis not present

## 2015-11-14 DIAGNOSIS — B3781 Candidal esophagitis: Secondary | ICD-10-CM | POA: Diagnosis not present

## 2015-11-14 DIAGNOSIS — M1990 Unspecified osteoarthritis, unspecified site: Secondary | ICD-10-CM | POA: Diagnosis not present

## 2015-11-14 DIAGNOSIS — K219 Gastro-esophageal reflux disease without esophagitis: Secondary | ICD-10-CM | POA: Diagnosis not present

## 2015-11-14 DIAGNOSIS — Z79899 Other long term (current) drug therapy: Secondary | ICD-10-CM | POA: Diagnosis not present

## 2015-11-14 DIAGNOSIS — K7460 Unspecified cirrhosis of liver: Secondary | ICD-10-CM | POA: Diagnosis not present

## 2015-11-14 DIAGNOSIS — Z9049 Acquired absence of other specified parts of digestive tract: Secondary | ICD-10-CM | POA: Diagnosis not present

## 2015-11-14 HISTORY — PX: ESOPHAGOGASTRODUODENOSCOPY: SHX1529

## 2015-11-20 DIAGNOSIS — K7460 Unspecified cirrhosis of liver: Secondary | ICD-10-CM | POA: Diagnosis not present

## 2015-11-21 DIAGNOSIS — R040 Epistaxis: Secondary | ICD-10-CM | POA: Diagnosis not present

## 2015-11-21 DIAGNOSIS — J342 Deviated nasal septum: Secondary | ICD-10-CM | POA: Diagnosis not present

## 2015-12-11 DIAGNOSIS — Z0000 Encounter for general adult medical examination without abnormal findings: Secondary | ICD-10-CM | POA: Diagnosis not present

## 2015-12-11 DIAGNOSIS — Z139 Encounter for screening, unspecified: Secondary | ICD-10-CM | POA: Diagnosis not present

## 2015-12-11 DIAGNOSIS — Z1389 Encounter for screening for other disorder: Secondary | ICD-10-CM | POA: Diagnosis not present

## 2015-12-20 DIAGNOSIS — K754 Autoimmune hepatitis: Secondary | ICD-10-CM | POA: Diagnosis not present

## 2015-12-20 DIAGNOSIS — J449 Chronic obstructive pulmonary disease, unspecified: Secondary | ICD-10-CM | POA: Diagnosis not present

## 2015-12-20 DIAGNOSIS — Z6823 Body mass index (BMI) 23.0-23.9, adult: Secondary | ICD-10-CM | POA: Diagnosis not present

## 2016-02-21 DIAGNOSIS — K7460 Unspecified cirrhosis of liver: Secondary | ICD-10-CM | POA: Diagnosis not present

## 2016-02-26 ENCOUNTER — Other Ambulatory Visit: Payer: Self-pay

## 2016-03-24 DIAGNOSIS — M179 Osteoarthritis of knee, unspecified: Secondary | ICD-10-CM | POA: Diagnosis not present

## 2016-03-24 DIAGNOSIS — D649 Anemia, unspecified: Secondary | ICD-10-CM | POA: Diagnosis not present

## 2016-03-24 DIAGNOSIS — I8393 Asymptomatic varicose veins of bilateral lower extremities: Secondary | ICD-10-CM | POA: Diagnosis not present

## 2016-03-24 DIAGNOSIS — R748 Abnormal levels of other serum enzymes: Secondary | ICD-10-CM | POA: Diagnosis not present

## 2016-03-24 DIAGNOSIS — E785 Hyperlipidemia, unspecified: Secondary | ICD-10-CM | POA: Diagnosis not present

## 2016-03-24 DIAGNOSIS — M25561 Pain in right knee: Secondary | ICD-10-CM | POA: Diagnosis not present

## 2016-03-24 DIAGNOSIS — Z23 Encounter for immunization: Secondary | ICD-10-CM | POA: Diagnosis not present

## 2016-04-03 DIAGNOSIS — Z6823 Body mass index (BMI) 23.0-23.9, adult: Secondary | ICD-10-CM | POA: Diagnosis not present

## 2016-04-03 DIAGNOSIS — N390 Urinary tract infection, site not specified: Secondary | ICD-10-CM | POA: Diagnosis not present

## 2016-04-03 DIAGNOSIS — J449 Chronic obstructive pulmonary disease, unspecified: Secondary | ICD-10-CM | POA: Diagnosis not present

## 2016-04-03 DIAGNOSIS — E785 Hyperlipidemia, unspecified: Secondary | ICD-10-CM | POA: Diagnosis not present

## 2016-04-03 DIAGNOSIS — R319 Hematuria, unspecified: Secondary | ICD-10-CM | POA: Diagnosis not present

## 2016-04-07 DIAGNOSIS — M1711 Unilateral primary osteoarthritis, right knee: Secondary | ICD-10-CM | POA: Diagnosis not present

## 2016-04-17 DIAGNOSIS — Z6823 Body mass index (BMI) 23.0-23.9, adult: Secondary | ICD-10-CM | POA: Diagnosis not present

## 2016-04-17 DIAGNOSIS — Z01818 Encounter for other preprocedural examination: Secondary | ICD-10-CM | POA: Diagnosis not present

## 2016-04-24 DIAGNOSIS — J40 Bronchitis, not specified as acute or chronic: Secondary | ICD-10-CM | POA: Diagnosis not present

## 2016-04-24 DIAGNOSIS — Z01818 Encounter for other preprocedural examination: Secondary | ICD-10-CM | POA: Diagnosis not present

## 2016-04-29 DIAGNOSIS — M2011 Hallux valgus (acquired), right foot: Secondary | ICD-10-CM | POA: Diagnosis not present

## 2016-04-29 DIAGNOSIS — M2041 Other hammer toe(s) (acquired), right foot: Secondary | ICD-10-CM | POA: Diagnosis not present

## 2016-05-01 DIAGNOSIS — M21611 Bunion of right foot: Secondary | ICD-10-CM | POA: Diagnosis not present

## 2016-05-01 DIAGNOSIS — Z87891 Personal history of nicotine dependence: Secondary | ICD-10-CM | POA: Diagnosis not present

## 2016-05-01 DIAGNOSIS — M2011 Hallux valgus (acquired), right foot: Secondary | ICD-10-CM | POA: Diagnosis not present

## 2016-05-01 DIAGNOSIS — G43909 Migraine, unspecified, not intractable, without status migrainosus: Secondary | ICD-10-CM | POA: Diagnosis not present

## 2016-05-01 DIAGNOSIS — M2041 Other hammer toe(s) (acquired), right foot: Secondary | ICD-10-CM | POA: Diagnosis not present

## 2016-05-01 DIAGNOSIS — Z8619 Personal history of other infectious and parasitic diseases: Secondary | ICD-10-CM | POA: Diagnosis not present

## 2016-05-01 DIAGNOSIS — I10 Essential (primary) hypertension: Secondary | ICD-10-CM | POA: Diagnosis not present

## 2016-05-01 DIAGNOSIS — M2021 Hallux rigidus, right foot: Secondary | ICD-10-CM | POA: Diagnosis not present

## 2016-05-01 DIAGNOSIS — J449 Chronic obstructive pulmonary disease, unspecified: Secondary | ICD-10-CM | POA: Diagnosis not present

## 2016-05-01 DIAGNOSIS — Z79899 Other long term (current) drug therapy: Secondary | ICD-10-CM | POA: Diagnosis not present

## 2016-05-01 DIAGNOSIS — L859 Epidermal thickening, unspecified: Secondary | ICD-10-CM | POA: Diagnosis not present

## 2016-05-01 HISTORY — PX: BUNIONECTOMY: SHX129

## 2016-05-06 DIAGNOSIS — Z4889 Encounter for other specified surgical aftercare: Secondary | ICD-10-CM | POA: Insufficient documentation

## 2016-05-06 HISTORY — DX: Encounter for other specified surgical aftercare: Z48.89

## 2016-05-22 DIAGNOSIS — M2041 Other hammer toe(s) (acquired), right foot: Secondary | ICD-10-CM | POA: Diagnosis not present

## 2016-05-22 DIAGNOSIS — M2011 Hallux valgus (acquired), right foot: Secondary | ICD-10-CM | POA: Diagnosis not present

## 2016-06-10 ENCOUNTER — Encounter: Payer: Self-pay | Admitting: Vascular Surgery

## 2016-06-24 DIAGNOSIS — N182 Chronic kidney disease, stage 2 (mild): Secondary | ICD-10-CM | POA: Diagnosis not present

## 2016-06-24 DIAGNOSIS — F419 Anxiety disorder, unspecified: Secondary | ICD-10-CM | POA: Diagnosis not present

## 2016-06-24 DIAGNOSIS — J441 Chronic obstructive pulmonary disease with (acute) exacerbation: Secondary | ICD-10-CM | POA: Diagnosis not present

## 2016-06-24 DIAGNOSIS — I129 Hypertensive chronic kidney disease with stage 1 through stage 4 chronic kidney disease, or unspecified chronic kidney disease: Secondary | ICD-10-CM | POA: Diagnosis not present

## 2016-07-03 DIAGNOSIS — J449 Chronic obstructive pulmonary disease, unspecified: Secondary | ICD-10-CM | POA: Diagnosis not present

## 2016-07-03 DIAGNOSIS — Z6824 Body mass index (BMI) 24.0-24.9, adult: Secondary | ICD-10-CM | POA: Diagnosis not present

## 2016-07-03 DIAGNOSIS — K7460 Unspecified cirrhosis of liver: Secondary | ICD-10-CM | POA: Diagnosis not present

## 2016-08-14 DIAGNOSIS — M1711 Unilateral primary osteoarthritis, right knee: Secondary | ICD-10-CM | POA: Diagnosis not present

## 2016-08-20 DIAGNOSIS — M1711 Unilateral primary osteoarthritis, right knee: Secondary | ICD-10-CM | POA: Diagnosis not present

## 2016-08-21 DIAGNOSIS — K7460 Unspecified cirrhosis of liver: Secondary | ICD-10-CM | POA: Diagnosis not present

## 2016-08-25 DIAGNOSIS — E782 Mixed hyperlipidemia: Secondary | ICD-10-CM | POA: Diagnosis not present

## 2016-08-25 DIAGNOSIS — K7460 Unspecified cirrhosis of liver: Secondary | ICD-10-CM | POA: Diagnosis not present

## 2016-08-25 DIAGNOSIS — I129 Hypertensive chronic kidney disease with stage 1 through stage 4 chronic kidney disease, or unspecified chronic kidney disease: Secondary | ICD-10-CM | POA: Diagnosis not present

## 2016-09-01 DIAGNOSIS — E782 Mixed hyperlipidemia: Secondary | ICD-10-CM | POA: Diagnosis not present

## 2016-09-01 DIAGNOSIS — I129 Hypertensive chronic kidney disease with stage 1 through stage 4 chronic kidney disease, or unspecified chronic kidney disease: Secondary | ICD-10-CM | POA: Diagnosis not present

## 2016-09-02 DIAGNOSIS — D509 Iron deficiency anemia, unspecified: Secondary | ICD-10-CM | POA: Diagnosis not present

## 2016-09-02 DIAGNOSIS — I10 Essential (primary) hypertension: Secondary | ICD-10-CM | POA: Diagnosis not present

## 2016-09-02 DIAGNOSIS — R5383 Other fatigue: Secondary | ICD-10-CM | POA: Diagnosis not present

## 2016-09-02 DIAGNOSIS — E785 Hyperlipidemia, unspecified: Secondary | ICD-10-CM | POA: Diagnosis not present

## 2016-09-02 DIAGNOSIS — Z139 Encounter for screening, unspecified: Secondary | ICD-10-CM | POA: Diagnosis not present

## 2016-09-10 DIAGNOSIS — D509 Iron deficiency anemia, unspecified: Secondary | ICD-10-CM | POA: Diagnosis not present

## 2016-09-18 DIAGNOSIS — D509 Iron deficiency anemia, unspecified: Secondary | ICD-10-CM | POA: Diagnosis not present

## 2016-09-18 DIAGNOSIS — K922 Gastrointestinal hemorrhage, unspecified: Secondary | ICD-10-CM | POA: Diagnosis not present

## 2016-09-18 DIAGNOSIS — R5383 Other fatigue: Secondary | ICD-10-CM | POA: Diagnosis not present

## 2016-09-18 DIAGNOSIS — K754 Autoimmune hepatitis: Secondary | ICD-10-CM | POA: Diagnosis not present

## 2016-09-18 DIAGNOSIS — K7460 Unspecified cirrhosis of liver: Secondary | ICD-10-CM | POA: Diagnosis not present

## 2016-09-23 DIAGNOSIS — R634 Abnormal weight loss: Secondary | ICD-10-CM | POA: Diagnosis not present

## 2016-09-23 DIAGNOSIS — K7460 Unspecified cirrhosis of liver: Secondary | ICD-10-CM | POA: Diagnosis not present

## 2016-09-26 DIAGNOSIS — J439 Emphysema, unspecified: Secondary | ICD-10-CM | POA: Diagnosis not present

## 2016-09-26 DIAGNOSIS — D509 Iron deficiency anemia, unspecified: Secondary | ICD-10-CM | POA: Diagnosis not present

## 2016-09-26 DIAGNOSIS — D649 Anemia, unspecified: Secondary | ICD-10-CM | POA: Diagnosis not present

## 2016-09-26 DIAGNOSIS — K7460 Unspecified cirrhosis of liver: Secondary | ICD-10-CM | POA: Diagnosis not present

## 2016-09-26 DIAGNOSIS — I700 Atherosclerosis of aorta: Secondary | ICD-10-CM | POA: Diagnosis not present

## 2016-09-26 DIAGNOSIS — R634 Abnormal weight loss: Secondary | ICD-10-CM | POA: Diagnosis not present

## 2016-09-30 DIAGNOSIS — Z6824 Body mass index (BMI) 24.0-24.9, adult: Secondary | ICD-10-CM | POA: Diagnosis not present

## 2016-09-30 DIAGNOSIS — M791 Myalgia: Secondary | ICD-10-CM | POA: Diagnosis not present

## 2016-09-30 DIAGNOSIS — D509 Iron deficiency anemia, unspecified: Secondary | ICD-10-CM | POA: Diagnosis not present

## 2016-09-30 DIAGNOSIS — G4700 Insomnia, unspecified: Secondary | ICD-10-CM | POA: Diagnosis not present

## 2016-09-30 DIAGNOSIS — R5383 Other fatigue: Secondary | ICD-10-CM | POA: Diagnosis not present

## 2016-10-15 DIAGNOSIS — M1711 Unilateral primary osteoarthritis, right knee: Secondary | ICD-10-CM | POA: Diagnosis not present

## 2016-10-16 DIAGNOSIS — Z6824 Body mass index (BMI) 24.0-24.9, adult: Secondary | ICD-10-CM | POA: Diagnosis not present

## 2016-10-16 DIAGNOSIS — Z1231 Encounter for screening mammogram for malignant neoplasm of breast: Secondary | ICD-10-CM | POA: Diagnosis not present

## 2016-10-16 DIAGNOSIS — D509 Iron deficiency anemia, unspecified: Secondary | ICD-10-CM | POA: Diagnosis not present

## 2016-10-16 DIAGNOSIS — B2790 Infectious mononucleosis, unspecified without complication: Secondary | ICD-10-CM | POA: Diagnosis not present

## 2016-10-21 DIAGNOSIS — M2012 Hallux valgus (acquired), left foot: Secondary | ICD-10-CM | POA: Diagnosis not present

## 2016-11-12 DIAGNOSIS — M85851 Other specified disorders of bone density and structure, right thigh: Secondary | ICD-10-CM | POA: Diagnosis not present

## 2016-11-12 DIAGNOSIS — M8580 Other specified disorders of bone density and structure, unspecified site: Secondary | ICD-10-CM | POA: Diagnosis not present

## 2016-11-12 DIAGNOSIS — Z780 Asymptomatic menopausal state: Secondary | ICD-10-CM | POA: Diagnosis not present

## 2016-11-12 DIAGNOSIS — Z1231 Encounter for screening mammogram for malignant neoplasm of breast: Secondary | ICD-10-CM | POA: Diagnosis not present

## 2016-12-16 DIAGNOSIS — Z139 Encounter for screening, unspecified: Secondary | ICD-10-CM | POA: Diagnosis not present

## 2016-12-16 DIAGNOSIS — Z0000 Encounter for general adult medical examination without abnormal findings: Secondary | ICD-10-CM | POA: Diagnosis not present

## 2016-12-16 DIAGNOSIS — Z1389 Encounter for screening for other disorder: Secondary | ICD-10-CM | POA: Diagnosis not present

## 2016-12-26 DIAGNOSIS — M79609 Pain in unspecified limb: Secondary | ICD-10-CM | POA: Diagnosis not present

## 2016-12-26 DIAGNOSIS — F3160 Bipolar disorder, current episode mixed, unspecified: Secondary | ICD-10-CM | POA: Diagnosis not present

## 2016-12-26 DIAGNOSIS — F339 Major depressive disorder, recurrent, unspecified: Secondary | ICD-10-CM | POA: Diagnosis not present

## 2016-12-26 DIAGNOSIS — F333 Major depressive disorder, recurrent, severe with psychotic symptoms: Secondary | ICD-10-CM | POA: Diagnosis not present

## 2016-12-26 DIAGNOSIS — I10 Essential (primary) hypertension: Secondary | ICD-10-CM | POA: Diagnosis not present

## 2016-12-29 DIAGNOSIS — R5383 Other fatigue: Secondary | ICD-10-CM | POA: Diagnosis not present

## 2016-12-29 DIAGNOSIS — D509 Iron deficiency anemia, unspecified: Secondary | ICD-10-CM | POA: Diagnosis not present

## 2016-12-29 DIAGNOSIS — B178 Other specified acute viral hepatitis: Secondary | ICD-10-CM | POA: Diagnosis not present

## 2016-12-29 DIAGNOSIS — B2709 Gammaherpesviral mononucleosis with other complications: Secondary | ICD-10-CM | POA: Diagnosis not present

## 2017-01-01 DIAGNOSIS — B2709 Gammaherpesviral mononucleosis with other complications: Secondary | ICD-10-CM | POA: Diagnosis not present

## 2017-01-02 DIAGNOSIS — R5383 Other fatigue: Secondary | ICD-10-CM | POA: Diagnosis not present

## 2017-01-02 DIAGNOSIS — K754 Autoimmune hepatitis: Secondary | ICD-10-CM | POA: Diagnosis not present

## 2017-01-02 DIAGNOSIS — K7460 Unspecified cirrhosis of liver: Secondary | ICD-10-CM | POA: Diagnosis not present

## 2017-01-02 DIAGNOSIS — J449 Chronic obstructive pulmonary disease, unspecified: Secondary | ICD-10-CM | POA: Diagnosis not present

## 2017-01-06 DIAGNOSIS — E039 Hypothyroidism, unspecified: Secondary | ICD-10-CM | POA: Diagnosis not present

## 2017-01-06 DIAGNOSIS — D509 Iron deficiency anemia, unspecified: Secondary | ICD-10-CM | POA: Diagnosis not present

## 2017-01-19 DIAGNOSIS — D509 Iron deficiency anemia, unspecified: Secondary | ICD-10-CM | POA: Diagnosis not present

## 2017-01-26 DIAGNOSIS — D509 Iron deficiency anemia, unspecified: Secondary | ICD-10-CM | POA: Diagnosis not present

## 2017-02-02 DIAGNOSIS — D509 Iron deficiency anemia, unspecified: Secondary | ICD-10-CM | POA: Diagnosis not present

## 2017-02-02 DIAGNOSIS — R5383 Other fatigue: Secondary | ICD-10-CM | POA: Diagnosis not present

## 2017-02-02 DIAGNOSIS — K7460 Unspecified cirrhosis of liver: Secondary | ICD-10-CM | POA: Diagnosis not present

## 2017-02-02 DIAGNOSIS — E039 Hypothyroidism, unspecified: Secondary | ICD-10-CM | POA: Diagnosis not present

## 2017-02-13 DIAGNOSIS — D649 Anemia, unspecified: Secondary | ICD-10-CM | POA: Diagnosis not present

## 2017-02-13 DIAGNOSIS — D509 Iron deficiency anemia, unspecified: Secondary | ICD-10-CM | POA: Diagnosis not present

## 2017-02-13 DIAGNOSIS — E039 Hypothyroidism, unspecified: Secondary | ICD-10-CM | POA: Diagnosis not present

## 2017-02-24 DIAGNOSIS — K7460 Unspecified cirrhosis of liver: Secondary | ICD-10-CM | POA: Diagnosis not present

## 2017-02-24 DIAGNOSIS — M1711 Unilateral primary osteoarthritis, right knee: Secondary | ICD-10-CM | POA: Diagnosis not present

## 2017-02-24 DIAGNOSIS — D509 Iron deficiency anemia, unspecified: Secondary | ICD-10-CM | POA: Diagnosis not present

## 2017-03-23 DIAGNOSIS — Z23 Encounter for immunization: Secondary | ICD-10-CM | POA: Diagnosis not present

## 2017-04-06 DIAGNOSIS — E039 Hypothyroidism, unspecified: Secondary | ICD-10-CM | POA: Diagnosis not present

## 2017-04-06 DIAGNOSIS — E782 Mixed hyperlipidemia: Secondary | ICD-10-CM | POA: Diagnosis not present

## 2017-04-06 DIAGNOSIS — K7460 Unspecified cirrhosis of liver: Secondary | ICD-10-CM | POA: Diagnosis not present

## 2017-04-06 DIAGNOSIS — D509 Iron deficiency anemia, unspecified: Secondary | ICD-10-CM | POA: Diagnosis not present

## 2017-04-06 DIAGNOSIS — I129 Hypertensive chronic kidney disease with stage 1 through stage 4 chronic kidney disease, or unspecified chronic kidney disease: Secondary | ICD-10-CM | POA: Diagnosis not present

## 2017-04-13 DIAGNOSIS — I129 Hypertensive chronic kidney disease with stage 1 through stage 4 chronic kidney disease, or unspecified chronic kidney disease: Secondary | ICD-10-CM | POA: Diagnosis not present

## 2017-04-13 DIAGNOSIS — N182 Chronic kidney disease, stage 2 (mild): Secondary | ICD-10-CM | POA: Diagnosis not present

## 2017-04-13 DIAGNOSIS — J449 Chronic obstructive pulmonary disease, unspecified: Secondary | ICD-10-CM | POA: Diagnosis not present

## 2017-04-13 DIAGNOSIS — E039 Hypothyroidism, unspecified: Secondary | ICD-10-CM | POA: Diagnosis not present

## 2017-05-22 DIAGNOSIS — D509 Iron deficiency anemia, unspecified: Secondary | ICD-10-CM | POA: Diagnosis not present

## 2017-05-22 DIAGNOSIS — R002 Palpitations: Secondary | ICD-10-CM | POA: Diagnosis not present

## 2017-05-22 DIAGNOSIS — R300 Dysuria: Secondary | ICD-10-CM | POA: Diagnosis not present

## 2017-05-22 DIAGNOSIS — R0789 Other chest pain: Secondary | ICD-10-CM | POA: Diagnosis not present

## 2017-05-22 DIAGNOSIS — I2119 ST elevation (STEMI) myocardial infarction involving other coronary artery of inferior wall: Secondary | ICD-10-CM | POA: Diagnosis not present

## 2017-05-22 DIAGNOSIS — R079 Chest pain, unspecified: Secondary | ICD-10-CM | POA: Diagnosis not present

## 2017-05-22 DIAGNOSIS — E039 Hypothyroidism, unspecified: Secondary | ICD-10-CM | POA: Diagnosis not present

## 2017-05-24 DIAGNOSIS — R079 Chest pain, unspecified: Secondary | ICD-10-CM | POA: Diagnosis not present

## 2017-05-24 DIAGNOSIS — I2119 ST elevation (STEMI) myocardial infarction involving other coronary artery of inferior wall: Secondary | ICD-10-CM | POA: Diagnosis not present

## 2017-06-12 DIAGNOSIS — D509 Iron deficiency anemia, unspecified: Secondary | ICD-10-CM | POA: Diagnosis not present

## 2017-06-12 DIAGNOSIS — Z6824 Body mass index (BMI) 24.0-24.9, adult: Secondary | ICD-10-CM | POA: Diagnosis not present

## 2017-06-19 DIAGNOSIS — D509 Iron deficiency anemia, unspecified: Secondary | ICD-10-CM | POA: Diagnosis not present

## 2017-07-09 DIAGNOSIS — D509 Iron deficiency anemia, unspecified: Secondary | ICD-10-CM | POA: Diagnosis not present

## 2017-07-09 DIAGNOSIS — Z6824 Body mass index (BMI) 24.0-24.9, adult: Secondary | ICD-10-CM | POA: Diagnosis not present

## 2017-08-07 ENCOUNTER — Telehealth: Payer: Self-pay | Admitting: Gastroenterology

## 2017-08-07 DIAGNOSIS — E785 Hyperlipidemia, unspecified: Secondary | ICD-10-CM | POA: Diagnosis not present

## 2017-08-07 DIAGNOSIS — E039 Hypothyroidism, unspecified: Secondary | ICD-10-CM | POA: Diagnosis not present

## 2017-08-07 DIAGNOSIS — I129 Hypertensive chronic kidney disease with stage 1 through stage 4 chronic kidney disease, or unspecified chronic kidney disease: Secondary | ICD-10-CM | POA: Diagnosis not present

## 2017-08-28 DIAGNOSIS — K7460 Unspecified cirrhosis of liver: Secondary | ICD-10-CM | POA: Diagnosis not present

## 2017-08-28 DIAGNOSIS — K754 Autoimmune hepatitis: Secondary | ICD-10-CM | POA: Diagnosis not present

## 2017-08-28 DIAGNOSIS — D509 Iron deficiency anemia, unspecified: Secondary | ICD-10-CM | POA: Diagnosis not present

## 2017-08-28 DIAGNOSIS — I129 Hypertensive chronic kidney disease with stage 1 through stage 4 chronic kidney disease, or unspecified chronic kidney disease: Secondary | ICD-10-CM | POA: Diagnosis not present

## 2017-10-02 DIAGNOSIS — Z6823 Body mass index (BMI) 23.0-23.9, adult: Secondary | ICD-10-CM | POA: Diagnosis not present

## 2017-10-02 DIAGNOSIS — J441 Chronic obstructive pulmonary disease with (acute) exacerbation: Secondary | ICD-10-CM | POA: Diagnosis not present

## 2017-10-02 DIAGNOSIS — D509 Iron deficiency anemia, unspecified: Secondary | ICD-10-CM | POA: Diagnosis not present

## 2017-10-16 DIAGNOSIS — D649 Anemia, unspecified: Secondary | ICD-10-CM | POA: Diagnosis not present

## 2017-10-23 DIAGNOSIS — D649 Anemia, unspecified: Secondary | ICD-10-CM | POA: Diagnosis not present

## 2018-01-01 DIAGNOSIS — I129 Hypertensive chronic kidney disease with stage 1 through stage 4 chronic kidney disease, or unspecified chronic kidney disease: Secondary | ICD-10-CM | POA: Diagnosis not present

## 2018-01-01 DIAGNOSIS — E785 Hyperlipidemia, unspecified: Secondary | ICD-10-CM | POA: Diagnosis not present

## 2018-01-01 DIAGNOSIS — E039 Hypothyroidism, unspecified: Secondary | ICD-10-CM | POA: Diagnosis not present

## 2018-01-01 DIAGNOSIS — D509 Iron deficiency anemia, unspecified: Secondary | ICD-10-CM | POA: Diagnosis not present

## 2018-01-13 DIAGNOSIS — D509 Iron deficiency anemia, unspecified: Secondary | ICD-10-CM | POA: Diagnosis not present

## 2018-01-15 DIAGNOSIS — D509 Iron deficiency anemia, unspecified: Secondary | ICD-10-CM | POA: Diagnosis not present

## 2018-01-15 DIAGNOSIS — Z9181 History of falling: Secondary | ICD-10-CM | POA: Diagnosis not present

## 2018-01-15 DIAGNOSIS — E785 Hyperlipidemia, unspecified: Secondary | ICD-10-CM | POA: Diagnosis not present

## 2018-01-15 DIAGNOSIS — I129 Hypertensive chronic kidney disease with stage 1 through stage 4 chronic kidney disease, or unspecified chronic kidney disease: Secondary | ICD-10-CM | POA: Diagnosis not present

## 2018-01-15 DIAGNOSIS — Z139 Encounter for screening, unspecified: Secondary | ICD-10-CM | POA: Diagnosis not present

## 2018-01-15 DIAGNOSIS — Z0000 Encounter for general adult medical examination without abnormal findings: Secondary | ICD-10-CM | POA: Diagnosis not present

## 2018-01-15 DIAGNOSIS — R319 Hematuria, unspecified: Secondary | ICD-10-CM | POA: Diagnosis not present

## 2018-01-15 DIAGNOSIS — E039 Hypothyroidism, unspecified: Secondary | ICD-10-CM | POA: Diagnosis not present

## 2018-01-15 DIAGNOSIS — Z1331 Encounter for screening for depression: Secondary | ICD-10-CM | POA: Diagnosis not present

## 2018-01-19 DIAGNOSIS — R319 Hematuria, unspecified: Secondary | ICD-10-CM | POA: Diagnosis not present

## 2018-01-19 DIAGNOSIS — K5730 Diverticulosis of large intestine without perforation or abscess without bleeding: Secondary | ICD-10-CM | POA: Diagnosis not present

## 2018-01-19 DIAGNOSIS — Z1231 Encounter for screening mammogram for malignant neoplasm of breast: Secondary | ICD-10-CM | POA: Diagnosis not present

## 2018-01-20 ENCOUNTER — Other Ambulatory Visit: Payer: Self-pay

## 2018-01-20 DIAGNOSIS — J449 Chronic obstructive pulmonary disease, unspecified: Secondary | ICD-10-CM | POA: Diagnosis not present

## 2018-01-20 DIAGNOSIS — R319 Hematuria, unspecified: Secondary | ICD-10-CM | POA: Diagnosis not present

## 2018-01-20 DIAGNOSIS — D509 Iron deficiency anemia, unspecified: Secondary | ICD-10-CM | POA: Diagnosis not present

## 2018-01-20 DIAGNOSIS — Z9181 History of falling: Secondary | ICD-10-CM | POA: Diagnosis not present

## 2018-01-21 DIAGNOSIS — Z789 Other specified health status: Secondary | ICD-10-CM | POA: Diagnosis not present

## 2018-01-21 DIAGNOSIS — J449 Chronic obstructive pulmonary disease, unspecified: Secondary | ICD-10-CM | POA: Diagnosis not present

## 2018-01-29 DIAGNOSIS — D509 Iron deficiency anemia, unspecified: Secondary | ICD-10-CM | POA: Diagnosis not present

## 2018-01-29 DIAGNOSIS — Z6823 Body mass index (BMI) 23.0-23.9, adult: Secondary | ICD-10-CM | POA: Diagnosis not present

## 2018-01-29 DIAGNOSIS — R319 Hematuria, unspecified: Secondary | ICD-10-CM | POA: Diagnosis not present

## 2018-02-04 DIAGNOSIS — R351 Nocturia: Secondary | ICD-10-CM | POA: Diagnosis not present

## 2018-02-04 DIAGNOSIS — R3129 Other microscopic hematuria: Secondary | ICD-10-CM | POA: Diagnosis not present

## 2018-02-09 DIAGNOSIS — M1711 Unilateral primary osteoarthritis, right knee: Secondary | ICD-10-CM | POA: Diagnosis not present

## 2018-02-19 DIAGNOSIS — E785 Hyperlipidemia, unspecified: Secondary | ICD-10-CM | POA: Diagnosis not present

## 2018-02-19 DIAGNOSIS — J449 Chronic obstructive pulmonary disease, unspecified: Secondary | ICD-10-CM | POA: Diagnosis not present

## 2018-02-19 DIAGNOSIS — I129 Hypertensive chronic kidney disease with stage 1 through stage 4 chronic kidney disease, or unspecified chronic kidney disease: Secondary | ICD-10-CM | POA: Diagnosis not present

## 2018-02-19 DIAGNOSIS — N182 Chronic kidney disease, stage 2 (mild): Secondary | ICD-10-CM | POA: Diagnosis not present

## 2018-03-18 DIAGNOSIS — Z23 Encounter for immunization: Secondary | ICD-10-CM | POA: Diagnosis not present

## 2018-03-22 DIAGNOSIS — E785 Hyperlipidemia, unspecified: Secondary | ICD-10-CM | POA: Diagnosis not present

## 2018-03-22 DIAGNOSIS — J449 Chronic obstructive pulmonary disease, unspecified: Secondary | ICD-10-CM | POA: Diagnosis not present

## 2018-03-22 DIAGNOSIS — I129 Hypertensive chronic kidney disease with stage 1 through stage 4 chronic kidney disease, or unspecified chronic kidney disease: Secondary | ICD-10-CM | POA: Diagnosis not present

## 2018-03-22 DIAGNOSIS — N182 Chronic kidney disease, stage 2 (mild): Secondary | ICD-10-CM | POA: Diagnosis not present

## 2018-04-22 DIAGNOSIS — E785 Hyperlipidemia, unspecified: Secondary | ICD-10-CM | POA: Diagnosis not present

## 2018-04-22 DIAGNOSIS — I129 Hypertensive chronic kidney disease with stage 1 through stage 4 chronic kidney disease, or unspecified chronic kidney disease: Secondary | ICD-10-CM | POA: Diagnosis not present

## 2018-04-22 DIAGNOSIS — J449 Chronic obstructive pulmonary disease, unspecified: Secondary | ICD-10-CM | POA: Diagnosis not present

## 2018-04-22 DIAGNOSIS — N182 Chronic kidney disease, stage 2 (mild): Secondary | ICD-10-CM | POA: Diagnosis not present

## 2018-04-27 DIAGNOSIS — E039 Hypothyroidism, unspecified: Secondary | ICD-10-CM | POA: Diagnosis not present

## 2018-04-27 DIAGNOSIS — E785 Hyperlipidemia, unspecified: Secondary | ICD-10-CM | POA: Diagnosis not present

## 2018-04-27 DIAGNOSIS — D509 Iron deficiency anemia, unspecified: Secondary | ICD-10-CM | POA: Diagnosis not present

## 2018-05-03 DIAGNOSIS — E039 Hypothyroidism, unspecified: Secondary | ICD-10-CM | POA: Diagnosis not present

## 2018-05-03 DIAGNOSIS — E785 Hyperlipidemia, unspecified: Secondary | ICD-10-CM | POA: Diagnosis not present

## 2018-05-03 DIAGNOSIS — Z139 Encounter for screening, unspecified: Secondary | ICD-10-CM | POA: Diagnosis not present

## 2018-05-03 DIAGNOSIS — D509 Iron deficiency anemia, unspecified: Secondary | ICD-10-CM | POA: Diagnosis not present

## 2018-05-03 DIAGNOSIS — I129 Hypertensive chronic kidney disease with stage 1 through stage 4 chronic kidney disease, or unspecified chronic kidney disease: Secondary | ICD-10-CM | POA: Diagnosis not present

## 2018-05-07 DIAGNOSIS — D509 Iron deficiency anemia, unspecified: Secondary | ICD-10-CM | POA: Diagnosis not present

## 2018-05-13 ENCOUNTER — Other Ambulatory Visit: Payer: Self-pay

## 2018-05-14 DIAGNOSIS — D509 Iron deficiency anemia, unspecified: Secondary | ICD-10-CM | POA: Diagnosis not present

## 2018-05-22 DIAGNOSIS — E785 Hyperlipidemia, unspecified: Secondary | ICD-10-CM | POA: Diagnosis not present

## 2018-05-22 DIAGNOSIS — E039 Hypothyroidism, unspecified: Secondary | ICD-10-CM | POA: Diagnosis not present

## 2018-05-22 DIAGNOSIS — I129 Hypertensive chronic kidney disease with stage 1 through stage 4 chronic kidney disease, or unspecified chronic kidney disease: Secondary | ICD-10-CM | POA: Diagnosis not present

## 2018-05-22 DIAGNOSIS — N182 Chronic kidney disease, stage 2 (mild): Secondary | ICD-10-CM | POA: Diagnosis not present

## 2018-06-07 DIAGNOSIS — D509 Iron deficiency anemia, unspecified: Secondary | ICD-10-CM | POA: Diagnosis not present

## 2018-06-07 DIAGNOSIS — Z139 Encounter for screening, unspecified: Secondary | ICD-10-CM | POA: Diagnosis not present

## 2018-06-07 DIAGNOSIS — Z6823 Body mass index (BMI) 23.0-23.9, adult: Secondary | ICD-10-CM | POA: Diagnosis not present

## 2018-06-07 DIAGNOSIS — J441 Chronic obstructive pulmonary disease with (acute) exacerbation: Secondary | ICD-10-CM | POA: Diagnosis not present

## 2018-06-22 DIAGNOSIS — I129 Hypertensive chronic kidney disease with stage 1 through stage 4 chronic kidney disease, or unspecified chronic kidney disease: Secondary | ICD-10-CM | POA: Diagnosis not present

## 2018-06-22 DIAGNOSIS — J441 Chronic obstructive pulmonary disease with (acute) exacerbation: Secondary | ICD-10-CM | POA: Diagnosis not present

## 2018-06-22 DIAGNOSIS — E785 Hyperlipidemia, unspecified: Secondary | ICD-10-CM | POA: Diagnosis not present

## 2018-06-22 DIAGNOSIS — N182 Chronic kidney disease, stage 2 (mild): Secondary | ICD-10-CM | POA: Diagnosis not present

## 2018-07-12 DIAGNOSIS — Z6823 Body mass index (BMI) 23.0-23.9, adult: Secondary | ICD-10-CM | POA: Diagnosis not present

## 2018-07-12 DIAGNOSIS — J449 Chronic obstructive pulmonary disease, unspecified: Secondary | ICD-10-CM | POA: Diagnosis not present

## 2018-07-12 DIAGNOSIS — D509 Iron deficiency anemia, unspecified: Secondary | ICD-10-CM | POA: Diagnosis not present

## 2018-07-12 DIAGNOSIS — Z139 Encounter for screening, unspecified: Secondary | ICD-10-CM | POA: Diagnosis not present

## 2018-07-23 DIAGNOSIS — J449 Chronic obstructive pulmonary disease, unspecified: Secondary | ICD-10-CM | POA: Diagnosis not present

## 2018-07-23 DIAGNOSIS — E785 Hyperlipidemia, unspecified: Secondary | ICD-10-CM | POA: Diagnosis not present

## 2018-07-23 DIAGNOSIS — D509 Iron deficiency anemia, unspecified: Secondary | ICD-10-CM | POA: Diagnosis not present

## 2018-07-23 DIAGNOSIS — I129 Hypertensive chronic kidney disease with stage 1 through stage 4 chronic kidney disease, or unspecified chronic kidney disease: Secondary | ICD-10-CM | POA: Diagnosis not present

## 2018-07-26 DIAGNOSIS — J449 Chronic obstructive pulmonary disease, unspecified: Secondary | ICD-10-CM | POA: Diagnosis not present

## 2018-07-26 DIAGNOSIS — D509 Iron deficiency anemia, unspecified: Secondary | ICD-10-CM | POA: Diagnosis not present

## 2018-07-26 DIAGNOSIS — Z6824 Body mass index (BMI) 24.0-24.9, adult: Secondary | ICD-10-CM | POA: Diagnosis not present

## 2018-08-09 DIAGNOSIS — R3129 Other microscopic hematuria: Secondary | ICD-10-CM | POA: Diagnosis not present

## 2018-08-10 ENCOUNTER — Other Ambulatory Visit: Payer: Self-pay | Admitting: *Deleted

## 2018-08-10 DIAGNOSIS — E785 Hyperlipidemia, unspecified: Secondary | ICD-10-CM | POA: Insufficient documentation

## 2018-08-10 NOTE — Patient Outreach (Signed)
  [facility]) Care Management  08/10/2018  Jaliya Siegmann 09-10-1935 [id number]   Telephone Screen  Referral Date: 08/06/2018  Referral Source: MD referral  Referral Reason: from Marco Collie, MD for pharmacy services for [place]: [facility]    Outreach attempt # 1 No answer. [facility] RN CM left HIPAA compliant voicemail message along with CM's contact info.   Plan: [facility] RN CM sent an unsuccessful outreach letter and scheduled this patient for another call attempt within 4 business days  Kimberly L. Lavina Hamman, RN, BSN, [place] Coordinator Office number [phone] Mobile number [phone]  Main [facility] number [phone] Fax number [phone]

## 2018-08-10 NOTE — Patient Outreach (Signed)
  [place] [facility]) Care Management  08/10/2018  Stephanie Love 07-01-1935 [id number]  Telephone Screen  Referral Date: 08/06/2018  Referral Source: MD referral  Referral Reason: from Stephanie Collie, MD for pharmacy services for Stephanie Love: Medicare and mutual of [place] coverage confirmed by pt     Patient returned a call to [facility] RN CM Patient is able to verify HIPAA Reviewed and addressed referral to Stephanie Love with patient  Stephanie Love confirms she is having cost concerns with Stephanie Love  She reports having hearing issue and wants other Stephanie Love staff to be aware that if she does not answer she will return the call if a voice message is left for her.    Social: Stephanie Love os widowed She lost her partner she took care of for 12 year She also reports some family concerns but denies the need of a referral to Stephanie Love for counseling or resources for her social concerns. She reports being on medication, Stephanie Love and feels these issues are related to life situations. She report having support of her granddaughter and daughter Her son passed away She is independent in her care and denies issues with transportation to medical appointments   Conditions: HTN, hypothyroidism, Major depression, COPD, osteoarthritis, epistaxis, nocturia, hx of hematuria,  Fall x 1 05/13/18   Medications: She reports not being able to afford her spriva and Stephanie Love She reports she had been paying around $450 for each. She last used her Stephanie Love in December but reports it worked better for her respiratory issues than Stephanie Love.  She states her deductible she believes is $430  She reports she generally goes in the coverage gap early in the year in the last few year related to these medications Other medications she takes includes losartan, metoprolol, tylenol, vitamin D, synthroid, proair, and Stephanie Love She takes Garlon Hatchet also but Dr Nyra Capes is providing samples for this  She denies any assistance for SSI supplemental  programs She uses a local pharmacy and has not been able to afford to use mail order  She denies side effects of medications    Appointments: Advance Directives: Denies need for assist with advance directives   Consent: [facility] RN CM reviewed [facility] services with patient. Patient gave verbal consent for services.     Plans  [facility] RN CM will refer Stephanie Love to Stephanie Love for medication assistance and medication management with Stephanie Love and Stephanie Love  Pt encouraged to return a call to [facility] RN CM prn Unsuccessful letter not sent instead a successful outreach letter was sent   Stephanie Millin L. Lavina Hamman, RN, BSN, [place] Coordinator Office number [phone] Mobile number [phone]  Main [facility] number [phone] Fax number [phone]

## 2018-08-11 ENCOUNTER — Ambulatory Visit: Payer: Medicare Other | Admitting: *Deleted

## 2018-08-12 ENCOUNTER — Other Ambulatory Visit: Payer: Self-pay | Admitting: Pharmacist

## 2018-08-12 NOTE — Patient Outreach (Signed)
[facility]) Care Management  [place]   08/12/2018  Stephanie Love 18-Feb-1936 [id number]  Reason for referral: Medication Assistance with [place] and Spiriva  Referral source: Dr. Marco Collie Current insurance: [employer]  Outreach:  Successful telephone call with [name].  HIPAA identifiers verified.   Subjective:  Patient agreeable to review medications.  She states she has a Medicare Part D plan that has a high deductible this year.   She reports she has been on Daliresp for years but recently had to start asking for samples from Dr. Nyra Capes due to high co-pays.  The office ran out of samples in December and patient has not taken it since then.  She continues using samples of Spiriva Respimat.  Next f/u appt with Dr. Nyra Capes in early March.   Objective: No results found for: CREATININE  No results found for: HGBA1C  Lipid Panel  No results found for: CHOL, TRIG, HDL, CHOLHDL, VLDL, LDLCALC, LDLDIRECT  BP Readings from Last 3 Encounters:  No data found for BP    Allergies not on file  Medications Reviewed Today   Medications were not reviewed prior to this encounter     Assessment:  Drugs sorted by system:  Neurologic/Psychologic:escitalopram  Cardiovascular:losartan, metoprolol XL  Pulmonary/Allergy: albuterol, tiotropium bromide (spiriva respimat), fluticasone NS, roflumilast  Gastrointestinal:lansoprazole  Endocrine:levothyroxine  Pain: acetaminophen   Medication Assistance Findings:  Medication assistance needs identified.   Extra Help:   '[]'  Already receiving Full Extra Help  '[]'  Already receiving Partial Extra Help  '[]'  Eligible based on reported income and assets  '[]'  Not Eligible based on reported income and assets  Patient Assistance Programs: 1) Spiriva made by [employer] o Income requirement met: '[x]'  Yes '[]'  No '[]'  Unknown o Out-of-pocket prescription expenditure met:    '[]'  Yes '[]'  No  '[]'  Unknown   '[x]'  Not applicable - Patient has met application requirements to apply for this patient assistance program.          2)  Daliresp made by [employer] o Income requirement met: '[x]'  Yes '[]'  No  '[]'  Unknown o Out-of-pocket prescription expenditure met:   '[]'  Yes '[x]'  No (3% of annual income)   '[]'  Unknown '[]'  Not applicable - Patient has not met application requirements to apply for this patient assistance program at this time.   - Recommended that patient request prescription be sent to [facility] so she can find out monthly cost with deductible.  Patient will request this at next appt with Dr. Nyra Capes in 2 weeks. By hopefully being able to get Spiriva through patient assistance company, patient can afford Daliresp co-pay.   Additional medication assistance options reviewed with patient as warranted:  Foundation programs   Neither PAN foundation nor [employer] have [place] covered  Plan: I will route patient assistance letter to [place] technician who will coordinate patient assistance program application process for medications listed above.  [facility] pharmacy technician will assist with obtaining all required documents from both patient and provider(s) and submit application(s) once completed.    [name], PharmD, [place] [phone]

## 2018-08-13 ENCOUNTER — Other Ambulatory Visit: Payer: Self-pay | Admitting: Pharmacy Technician

## 2018-08-13 NOTE — Patient Outreach (Signed)
[place] [facility]) Care Management  08/13/2018  Stephanie Love 11-Apr-1936 [id number]                                                  Medication Assistance Referral  Referral From: [place]  Medication/Company: Spiriva Respimat / [employer] Patient application portion:  Mailed Provider application portion: Faxed  to Dr. Nyra Capes   Follow up:  Will follow up with patient in 5-7 business days to confirm application(s) have been received.  [name] Chana Bode [place] Certified Pharmacy Technician [place] Management Direct Dial:[phone]

## 2018-08-20 DIAGNOSIS — J449 Chronic obstructive pulmonary disease, unspecified: Secondary | ICD-10-CM | POA: Diagnosis not present

## 2018-08-20 DIAGNOSIS — N182 Chronic kidney disease, stage 2 (mild): Secondary | ICD-10-CM | POA: Diagnosis not present

## 2018-08-20 DIAGNOSIS — I129 Hypertensive chronic kidney disease with stage 1 through stage 4 chronic kidney disease, or unspecified chronic kidney disease: Secondary | ICD-10-CM | POA: Diagnosis not present

## 2018-08-20 DIAGNOSIS — E785 Hyperlipidemia, unspecified: Secondary | ICD-10-CM | POA: Diagnosis not present

## 2018-08-26 DIAGNOSIS — M1711 Unilateral primary osteoarthritis, right knee: Secondary | ICD-10-CM | POA: Diagnosis not present

## 2018-08-26 DIAGNOSIS — J449 Chronic obstructive pulmonary disease, unspecified: Secondary | ICD-10-CM | POA: Diagnosis not present

## 2018-08-26 DIAGNOSIS — D509 Iron deficiency anemia, unspecified: Secondary | ICD-10-CM | POA: Diagnosis not present

## 2018-08-26 DIAGNOSIS — R609 Edema, unspecified: Secondary | ICD-10-CM | POA: Diagnosis not present

## 2018-08-26 DIAGNOSIS — Z6825 Body mass index (BMI) 25.0-25.9, adult: Secondary | ICD-10-CM | POA: Diagnosis not present

## 2018-08-31 ENCOUNTER — Other Ambulatory Visit: Payer: Self-pay | Admitting: Pharmacy Technician

## 2018-08-31 NOTE — Patient Outreach (Addendum)
[place] [facility]) Care Management  08/31/2018  Stephanie Love 1936-02-07 [id number]    Unsuccessful call #1 placed to patient regarding patient assistance application for Spiriva Respimat, HIPAA compliant voicemail left.   Follow up:  Will make 2nd call attempt in 2-3 business days if call has not been returned.  [name] [place] Management Direct [phone]   ADDENDUM 2:15pm  Return call to patient, HIPAA identifiers verified. [name] confirms that she received patient assistance applications. She states she hasn't filled it out due to have always being denied in the past. She states she 'Doesn't want to fool with it." She thanked [facility]) for our time but since her daughter helped her purchase her Daliresp and Dr. Nyra Capes gave her a 5 days supply of Prednisone she feels a lot better. Informed her that if she decided to change her mind to please feel free to call me.  Will route note to [place] for case closure.  [name] [place] Management Direct [phone]   Will close [place] case as noted above due to patient no longer being interested in applying for patient assistance at this time.  Am happy to help in the future if patient wishes to proceed.   [name], PharmD, [place] [phone]

## 2018-09-21 DIAGNOSIS — I129 Hypertensive chronic kidney disease with stage 1 through stage 4 chronic kidney disease, or unspecified chronic kidney disease: Secondary | ICD-10-CM | POA: Diagnosis not present

## 2018-09-21 DIAGNOSIS — N182 Chronic kidney disease, stage 2 (mild): Secondary | ICD-10-CM | POA: Diagnosis not present

## 2018-09-21 DIAGNOSIS — J449 Chronic obstructive pulmonary disease, unspecified: Secondary | ICD-10-CM | POA: Diagnosis not present

## 2018-09-21 DIAGNOSIS — E785 Hyperlipidemia, unspecified: Secondary | ICD-10-CM | POA: Diagnosis not present

## 2018-10-21 DIAGNOSIS — N182 Chronic kidney disease, stage 2 (mild): Secondary | ICD-10-CM | POA: Diagnosis not present

## 2018-10-21 DIAGNOSIS — E785 Hyperlipidemia, unspecified: Secondary | ICD-10-CM | POA: Diagnosis not present

## 2018-10-21 DIAGNOSIS — I129 Hypertensive chronic kidney disease with stage 1 through stage 4 chronic kidney disease, or unspecified chronic kidney disease: Secondary | ICD-10-CM | POA: Diagnosis not present

## 2018-10-21 DIAGNOSIS — J449 Chronic obstructive pulmonary disease, unspecified: Secondary | ICD-10-CM | POA: Diagnosis not present

## 2018-11-13 DIAGNOSIS — R110 Nausea: Secondary | ICD-10-CM | POA: Diagnosis not present

## 2018-11-13 DIAGNOSIS — R1111 Vomiting without nausea: Secondary | ICD-10-CM | POA: Diagnosis not present

## 2018-11-13 DIAGNOSIS — I639 Cerebral infarction, unspecified: Secondary | ICD-10-CM

## 2018-11-13 DIAGNOSIS — R42 Dizziness and giddiness: Secondary | ICD-10-CM | POA: Diagnosis not present

## 2018-11-13 DIAGNOSIS — R112 Nausea with vomiting, unspecified: Secondary | ICD-10-CM | POA: Diagnosis not present

## 2018-11-13 DIAGNOSIS — Z87891 Personal history of nicotine dependence: Secondary | ICD-10-CM | POA: Diagnosis not present

## 2018-11-13 DIAGNOSIS — H814 Vertigo of central origin: Secondary | ICD-10-CM | POA: Diagnosis not present

## 2018-11-13 DIAGNOSIS — J9811 Atelectasis: Secondary | ICD-10-CM | POA: Diagnosis not present

## 2018-11-13 DIAGNOSIS — R29702 NIHSS score 2: Secondary | ICD-10-CM | POA: Diagnosis not present

## 2018-11-13 DIAGNOSIS — I10 Essential (primary) hypertension: Secondary | ICD-10-CM | POA: Diagnosis not present

## 2018-11-13 DIAGNOSIS — J449 Chronic obstructive pulmonary disease, unspecified: Secondary | ICD-10-CM | POA: Diagnosis not present

## 2018-11-13 HISTORY — DX: Cerebral infarction, unspecified: I63.9

## 2018-11-14 ENCOUNTER — Inpatient Hospital Stay (HOSPITAL_COMMUNITY): Payer: Medicare Other

## 2018-11-14 ENCOUNTER — Encounter (HOSPITAL_COMMUNITY): Payer: Self-pay | Admitting: *Deleted

## 2018-11-14 ENCOUNTER — Inpatient Hospital Stay (HOSPITAL_COMMUNITY)
Admission: AD | Admit: 2018-11-14 | Discharge: 2018-11-17 | DRG: 042 | Disposition: A | Discharge status: Rehabilitation Facility | Payer: Medicare Other | Source: Other Acute Inpatient Hospital | Attending: Neurology | Admitting: Neurology

## 2018-11-14 DIAGNOSIS — H538 Other visual disturbances: Secondary | ICD-10-CM | POA: Diagnosis present

## 2018-11-14 DIAGNOSIS — I639 Cerebral infarction, unspecified: Secondary | ICD-10-CM | POA: Diagnosis present

## 2018-11-14 DIAGNOSIS — R29703 NIHSS score 3: Secondary | ICD-10-CM | POA: Diagnosis present

## 2018-11-14 DIAGNOSIS — I63443 Cerebral infarction due to embolism of bilateral cerebellar arteries: Principal | ICD-10-CM | POA: Diagnosis present

## 2018-11-14 DIAGNOSIS — J449 Chronic obstructive pulmonary disease, unspecified: Secondary | ICD-10-CM | POA: Diagnosis present

## 2018-11-14 DIAGNOSIS — R270 Ataxia, unspecified: Secondary | ICD-10-CM | POA: Diagnosis present

## 2018-11-14 DIAGNOSIS — I6622 Occlusion and stenosis of left posterior cerebral artery: Secondary | ICD-10-CM

## 2018-11-14 DIAGNOSIS — N182 Chronic kidney disease, stage 2 (mild): Secondary | ICD-10-CM | POA: Diagnosis not present

## 2018-11-14 DIAGNOSIS — Z833 Family history of diabetes mellitus: Secondary | ICD-10-CM | POA: Diagnosis not present

## 2018-11-14 DIAGNOSIS — I9589 Other hypotension: Secondary | ICD-10-CM | POA: Diagnosis not present

## 2018-11-14 DIAGNOSIS — D638 Anemia in other chronic diseases classified elsewhere: Secondary | ICD-10-CM

## 2018-11-14 DIAGNOSIS — R339 Retention of urine, unspecified: Secondary | ICD-10-CM | POA: Diagnosis not present

## 2018-11-14 DIAGNOSIS — Z8249 Family history of ischemic heart disease and other diseases of the circulatory system: Secondary | ICD-10-CM

## 2018-11-14 DIAGNOSIS — E785 Hyperlipidemia, unspecified: Secondary | ICD-10-CM | POA: Diagnosis present

## 2018-11-14 DIAGNOSIS — I10 Essential (primary) hypertension: Secondary | ICD-10-CM | POA: Diagnosis not present

## 2018-11-14 DIAGNOSIS — Z87891 Personal history of nicotine dependence: Secondary | ICD-10-CM | POA: Diagnosis not present

## 2018-11-14 DIAGNOSIS — I129 Hypertensive chronic kidney disease with stage 1 through stage 4 chronic kidney disease, or unspecified chronic kidney disease: Secondary | ICD-10-CM | POA: Diagnosis not present

## 2018-11-14 DIAGNOSIS — K7581 Nonalcoholic steatohepatitis (NASH): Secondary | ICD-10-CM | POA: Diagnosis present

## 2018-11-14 DIAGNOSIS — I071 Rheumatic tricuspid insufficiency: Secondary | ICD-10-CM | POA: Diagnosis present

## 2018-11-14 DIAGNOSIS — I63119 Cerebral infarction due to embolism of unspecified vertebral artery: Secondary | ICD-10-CM | POA: Diagnosis not present

## 2018-11-14 DIAGNOSIS — Z9282 Status post administration of tPA (rtPA) in a different facility within the last 24 hours prior to admission to current facility: Secondary | ICD-10-CM

## 2018-11-14 DIAGNOSIS — J441 Chronic obstructive pulmonary disease with (acute) exacerbation: Secondary | ICD-10-CM

## 2018-11-14 DIAGNOSIS — Z880 Allergy status to penicillin: Secondary | ICD-10-CM

## 2018-11-14 DIAGNOSIS — I959 Hypotension, unspecified: Secondary | ICD-10-CM | POA: Diagnosis not present

## 2018-11-14 DIAGNOSIS — R29702 NIHSS score 2: Secondary | ICD-10-CM | POA: Diagnosis not present

## 2018-11-14 DIAGNOSIS — I361 Nonrheumatic tricuspid (valve) insufficiency: Secondary | ICD-10-CM

## 2018-11-14 DIAGNOSIS — I6389 Other cerebral infarction: Secondary | ICD-10-CM | POA: Diagnosis not present

## 2018-11-14 DIAGNOSIS — E039 Hypothyroidism, unspecified: Secondary | ICD-10-CM | POA: Diagnosis present

## 2018-11-14 DIAGNOSIS — R7303 Prediabetes: Secondary | ICD-10-CM | POA: Diagnosis not present

## 2018-11-14 DIAGNOSIS — E46 Unspecified protein-calorie malnutrition: Secondary | ICD-10-CM | POA: Diagnosis not present

## 2018-11-14 DIAGNOSIS — R0989 Other specified symptoms and signs involving the circulatory and respiratory systems: Secondary | ICD-10-CM | POA: Diagnosis not present

## 2018-11-14 DIAGNOSIS — R471 Dysarthria and anarthria: Secondary | ICD-10-CM | POA: Diagnosis present

## 2018-11-14 DIAGNOSIS — I6340 Cerebral infarction due to embolism of unspecified cerebral artery: Secondary | ICD-10-CM | POA: Diagnosis not present

## 2018-11-14 DIAGNOSIS — R1110 Vomiting, unspecified: Secondary | ICD-10-CM

## 2018-11-14 DIAGNOSIS — D509 Iron deficiency anemia, unspecified: Secondary | ICD-10-CM | POA: Diagnosis not present

## 2018-11-14 DIAGNOSIS — K5901 Slow transit constipation: Secondary | ICD-10-CM | POA: Diagnosis not present

## 2018-11-14 HISTORY — DX: Anemia, unspecified: D64.9

## 2018-11-14 HISTORY — DX: Nonalcoholic steatohepatitis (NASH): K75.81

## 2018-11-14 HISTORY — DX: Gastro-esophageal reflux disease without esophagitis: K21.9

## 2018-11-14 HISTORY — DX: Other microscopic hematuria: R31.29

## 2018-11-14 HISTORY — DX: Essential (primary) hypertension: I10

## 2018-11-14 HISTORY — DX: Diverticulosis of intestine, part unspecified, without perforation or abscess without bleeding: K57.90

## 2018-11-14 HISTORY — DX: Urinary tract infection, site not specified: N39.0

## 2018-11-14 HISTORY — DX: Chronic obstructive pulmonary disease, unspecified: J44.9

## 2018-11-14 HISTORY — DX: Hypothyroidism, unspecified: E03.9

## 2018-11-14 HISTORY — DX: Cerebral infarction, unspecified: I63.9

## 2018-11-14 HISTORY — DX: Unspecified osteoarthritis, unspecified site: M19.90

## 2018-11-14 LAB — LIPID PANEL
Cholesterol: 124 mg/dL (ref 0–200)
HDL: 51 mg/dL (ref >40)
LDL Cholesterol: 65 mg/dL (ref 0–99)
Total CHOL/HDL Ratio: 2.4 RATIO
Triglycerides: 40 mg/dL (ref <150)
VLDL: 8 mg/dL (ref 0–40)

## 2018-11-14 LAB — HEMOGLOBIN A1C
Hgb A1c MFr Bld: 5.9 % — ABNORMAL HIGH (ref 4.8–5.6)
Mean Plasma Glucose: 122.63 mg/dL

## 2018-11-14 LAB — MRSA PCR SCREENING: MRSA by PCR: POSITIVE — AB

## 2018-11-14 MED ORDER — UMECLIDINIUM BROMIDE 62.5 MCG/INH IN AEPB
1.0000 | INHALATION_SPRAY | Freq: Every day | RESPIRATORY_TRACT | Status: DC
  Administered 2018-11-14 – 2018-11-17 (×3): 1 via RESPIRATORY_TRACT
  Filled 2018-11-14 (×2): qty 7

## 2018-11-14 MED ORDER — SODIUM CHLORIDE 0.9 % IV SOLN
8.0000 mg | Freq: Three times a day (TID) | INTRAVENOUS | Status: DC | PRN
  Filled 2018-11-14: qty 4

## 2018-11-14 MED ORDER — LABETALOL HCL 5 MG/ML IV SOLN: 5.0000 mg | INTRAVENOUS | Status: DC | PRN

## 2018-11-14 MED ORDER — LEVOTHYROXINE SODIUM 112 MCG PO TABS
112.0000 ug | ORAL_TABLET | Freq: Every day | ORAL | Status: DC
  Administered 2018-11-15 – 2018-11-17 (×3): 112 ug via ORAL
  Filled 2018-11-14 (×4): qty 1

## 2018-11-14 MED ORDER — ROFLUMILAST 500 MCG PO TABS
500.0000 ug | ORAL_TABLET | Freq: Every day | ORAL | Status: DC
  Administered 2018-11-14 – 2018-11-17 (×4): 500 ug via ORAL
  Filled 2018-11-14 (×4): qty 1

## 2018-11-14 MED ORDER — FLUTICASONE PROPIONATE 50 MCG/ACT NA SUSP
1.0000 | Freq: Every day | NASAL | Status: DC | PRN
  Filled 2018-11-14: qty 16

## 2018-11-14 MED ORDER — MUPIROCIN 2 % EX OINT
1.0000 "application " | TOPICAL_OINTMENT | Freq: Two times a day (BID) | CUTANEOUS | Status: DC
  Administered 2018-11-14 – 2018-11-17 (×7): 1 via NASAL
  Filled 2018-11-14 (×2): qty 22

## 2018-11-14 MED ORDER — CHLORHEXIDINE GLUCONATE CLOTH 2 % EX PADS
6.0000 | MEDICATED_PAD | Freq: Every day | CUTANEOUS | Status: DC
  Administered 2018-11-14 – 2018-11-17 (×3): 6 via TOPICAL

## 2018-11-14 MED ORDER — PANTOPRAZOLE SODIUM 40 MG IV SOLR
40.0000 mg | Freq: Every day | INTRAVENOUS | Status: DC
  Administered 2018-11-14 – 2018-11-16 (×3): 40 mg via INTRAVENOUS
  Filled 2018-11-14 (×3): qty 40

## 2018-11-14 MED ORDER — SODIUM CHLORIDE 0.9 % IV SOLN
INTRAVENOUS | Status: DC
  Administered 2018-11-14 – 2018-11-15 (×2): via INTRAVENOUS

## 2018-11-14 MED ORDER — ACETAMINOPHEN 325 MG PO TABS
650.0000 mg | ORAL_TABLET | ORAL | Status: DC | PRN
  Administered 2018-11-16: 650 mg via ORAL
  Filled 2018-11-14 (×2): qty 2

## 2018-11-14 MED ORDER — ATORVASTATIN CALCIUM 40 MG PO TABS
40.0000 mg | ORAL_TABLET | Freq: Every day | ORAL | Status: DC
  Administered 2018-11-14: 40 mg via ORAL
  Filled 2018-11-14: qty 1

## 2018-11-14 MED ORDER — ACETAMINOPHEN 160 MG/5ML PO SOLN: 650.0000 mg | ORAL | Status: DC | PRN

## 2018-11-14 MED ORDER — SENNOSIDES-DOCUSATE SODIUM 8.6-50 MG PO TABS: 1.0000 | ORAL_TABLET | Freq: Every evening | ORAL | Status: DC | PRN

## 2018-11-14 MED ORDER — PROCHLORPERAZINE EDISYLATE 10 MG/2ML IJ SOLN
10.0000 mg | Freq: Four times a day (QID) | INTRAMUSCULAR | Status: DC | PRN
  Administered 2018-11-14 (×2): 10 mg via INTRAVENOUS
  Filled 2018-11-14 (×2): qty 2

## 2018-11-14 MED ORDER — ALBUTEROL SULFATE (2.5 MG/3ML) 0.083% IN NEBU: 2.5000 mg | INHALATION_SOLUTION | Freq: Four times a day (QID) | RESPIRATORY_TRACT | Status: DC | PRN

## 2018-11-14 MED ORDER — ONDANSETRON HCL 4 MG/2ML IJ SOLN
4.0000 mg | Freq: Three times a day (TID) | INTRAMUSCULAR | Status: DC | PRN
  Administered 2018-11-14: 4 mg via INTRAVENOUS
  Filled 2018-11-14 (×3): qty 2

## 2018-11-14 MED ORDER — SENNA 8.6 MG PO TABS
1.0000 | ORAL_TABLET | Freq: Every day | ORAL | Status: DC
  Administered 2018-11-14 – 2018-11-17 (×4): 8.6 mg via ORAL
  Filled 2018-11-14 (×4): qty 1

## 2018-11-14 MED ORDER — ACETAMINOPHEN 650 MG RE SUPP: 650.0000 mg | RECTAL | Status: DC | PRN

## 2018-11-14 MED ORDER — STROKE: EARLY STAGES OF RECOVERY BOOK
Freq: Once | Status: AC
  Administered 2018-11-14: 1
  Filled 2018-11-14: qty 1

## 2018-11-14 NOTE — Progress Notes (Signed)
Patient arrived to 4NICU from [facility] ED. Per report from [place], Therapist, sports, at [place], patient experiencing nausea, vomiting and dizziness. CT Head negative for bleed,TPA bolus started 11/13/18 at 2122. Covid and SARS tests negative.

## 2018-11-14 NOTE — Progress Notes (Signed)
  Echocardiogram 2D Echocardiogram has been performed.  Stephanie Love 11/14/2018, 5:53 PM

## 2018-11-14 NOTE — Progress Notes (Signed)
Attempted to take patient to MRI early, they are unable to see patient until close to the 24 hour mark post TPA. Will pass along to night shift.

## 2018-11-14 NOTE — Progress Notes (Signed)
Reviewed MRI brain, she has small subdural and subarachnoid hemorrhage.  We will hold off starting aspirin   Recommend repeating CT head in 2 days to see if hemorrhage has resolved. BP goal normotension as patient is now outside of permissive hypertension window.

## 2018-11-14 NOTE — Progress Notes (Signed)
Assisted tele visit to patient with daughter and family member.  Landis Dowdy M, RN  

## 2018-11-14 NOTE — Progress Notes (Signed)
Assisted tele visit to patient with daughter.  Ahna Konkle Ann, RN  

## 2018-11-14 NOTE — Progress Notes (Signed)
PT Cancellation Note  Patient Details Name: Adalene Gulotta MRN: [id number] DOB: 1936/03/02   Cancelled Treatment:    Reason Eval/Treat Not Completed: Active bedrest order Will follow as appropriate.    Marguarite Arbour A Shallen Luedke 11/14/2018, 6:46 AM Wray Kearns, PT, DPT Acute Rehabilitation Services Pager [phone] Office [phone]

## 2018-11-14 NOTE — H&P (Addendum)
Chief Complaint: Sudden onset dizziness  History obtained from: Patient and Chart     HPI:                                                                                                                                       Stephanie Love is a 83 y.o. female past medical history of COPD, hypertension, hypothyroidism,GERD resents to the emergency room at [facility] with sudden onset dizziness that began around 6:30 PM.  She states that it was sudden onset, did not vary with position.  Unable to state if the room was spinning.  She had nausea and vomited multiple times.  She went to [facility] and was evaluated by tele neurology.  On assessment, noted to have disconjugate gaze, dysarthria and left-sided ataxia.  Risk  versus benefits was discussed with patient and she is received IV TPA (start time was 9.22pm) .  Her blood pressure remained below 119 systolic per reports.  She was transferred to [facility] for further management.  ED work-up ( from outside records) CT head was performed at [facility] showed no acute findings.  CT angiogram was also performed there is no LVO.  PT/INR was 1.  CBC was within normal limits.  Electrolytes are within normal limits.  COVID test was negative.  Date last known well: 5 23-20 Time last known well: 6 PM tPA Given: Yes NIHSS: 3 (OSH) > 2 ( on arrival)  Baseline MRS  Past medical history:  as noted above   No family history on file. Social History:  has no history on file for tobacco, alcohol, and drug.  Allergies:  Allergies  Allergen Reactions  . Penicillins     Medications:                                                                                                                        I reviewed home medications.  We will reorder relevant medications.   ROS:  14 systems reviewed and  negative except above   Examination:                                                                                                      General: Appears well-developed  Psych: Affect appropriate to situation Eyes: No scleral injection HENT: No OP obstrucion Head: Normocephalic.  Cardiovascular: Normal rate and regular rhythm.  Respiratory: Effort normal and breath sounds normal to anterior ascultation GI: Soft.  No distension. There is no tenderness.  Skin: WDI    Neurological Examination Mental Status: Alert, oriented, thought content appropriate.  Speech fluent without evidence of aphasia. Able to follow 3 step commands without difficulty. Cranial Nerves: II: Visual fields grossly normal,  III,IV, VI: ptosis not present, extra-ocular motions intact bilaterally, no nystagmus noted, pupils equal, round, reactive to light and accommodation V,VII: smile symmetric, facial light touch sensation normal bilaterally VIII: hearing normal bilaterally IX,X: uvula rises symmetrically XI: bilateral shoulder shrug XII: midline tongue extension Motor: Right : Upper extremity   5/5    Left:     Upper extremity   5/5  Lower extremity   5/5     Lower extremity   5/5 Tone and bulk:normal tone throughout; no atrophy noted Sensory: Pinprick and light touch intact throughout, bilaterally Deep Tendon Reflexes: 2+ and symmetric throughout Plantars: Right: downgoing   Left: downgoing Cerebellar: Impaired finger-to-nose and heel-to-shin on the left side. Gait: normal gait and station     Lab Results: Basic Metabolic Panel: No results for input(s): NA, K, CL, CO2, GLUCOSE, BUN, CREATININE, CALCIUM, MG, PHOS in the last 168 hours.  CBC: No results for input(s): WBC, NEUTROABS, HGB, HCT, MCV, PLT in the last 168 hours.  Coagulation Studies: No results for input(s): LABPROT, INR in the last 72 hours.  Imaging: No results found.   ASSESSMENT AND PLAN    Acute Ischemic Stroke   Risk  factors : Hypertension, COPD Etiology: Likely small vessel disease however pending evaluation  Recommend # MRI of the brain without contrast #Transthoracic Echo  # Hold ASA until 24hr after tPA, post 24hr CT scan  #Start or continue Atorvastatin 80 mg/other high intensity statin # BP goal: permissive HTN uptto 180/105 mmHg (post tpA goal) # HBAIC and Lipid profile # Telemetry monitoring # Frequent neuro checks # NPO until passes stroke swallow screen  COPD -Continue home nebulizers, meds  Hypertension -Held home medications -Permissive hypertension up to 180 /105 mmHg for first 24 hrs  Hypothyroidism -Continue home levothyroxine  Full code DVT prophylaxis: SCD  Please page stroke NP  Or  PA  Or MD from 8am -4 pm  as this patient from this time will be  followed by the stroke.   You can look them up on [email or web address]  Password [email or web address]    This patient is neurologically critically ill due to stroke s/p TPA. She is at risk for significant risk of neurological worsening from cerebral edema,  death from brain herniation, heart failure, hemorrhagic conversion, infection, respiratory failure and seizure. This patient's care requires constant monitoring of vital signs,  hemodynamics, respiratory and cardiac monitoring, review of multiple databases, neurological assessment, discussion with family, other specialists and medical decision making of high complexity.  I spent 35 minutes of neurocritical time in the care of this patient.     Tayshawn Purnell [facility] Pager Number [phone]

## 2018-11-14 NOTE — Evaluation (Signed)
Physical Therapy Evaluation Patient Details Name: Stephanie Love MRN: [id number] DOB: 1935-11-06 Today's Date: 11/14/2018   History of Present Illness  Patient is a 83 yo female who presents from [place] with NV and dizziness. sp tPA. NIH:3. Head CT-unremarkable. MRI pending. PMH includes COPD, HTN, NASH.  Clinical Impression  Patient presents with lethargy, dizziness, truncal and left sided ataxia, impaired balance and impaired mobility s/p above. Tolerated bed mobility with Mod-Max A for safety. Pt tolerated sitting EOB ~8 minutes with Min A for support, truncal ataxia improved with approximation at trunk and WB through LUE. Pt kept eyes closed for most of session due to dizziness. Cognition seems [facility] for basic mobility tasks. Will plan for OOB next session as tolerated when arousal improves and with 2 person. Would benefit from CIR to maximize independence and mobility prior to return home. Will follow acutely.    Follow Up Recommendations CIR;Supervision for mobility/OOB;Supervision/Assistance - 24 hour    Equipment Recommendations  None recommended by PT    Recommendations for Other Services       Precautions / Restrictions Precautions Precautions: Fall Precaution Comments: ataxia Restrictions Weight Bearing Restrictions: No      Mobility  Bed Mobility Overal bed mobility: Needs Assistance Bed Mobility: Rolling;Sidelying to Sit;Sit to Sidelying Rolling: Mod assist Sidelying to sit: Max assist;HOB elevated     Sit to sidelying: Mod assist;HOB elevated General bed mobility comments: Assist to roll and reach for rail; scoot bottom and elevate trunk to get to EOB; truncal ataxia and LUE ataxia noted sitting EOB.  Transfers                 General transfer comment: Deferred secondary to lethargy, dizziness, safety.   Ambulation/Gait                Stairs            Wheelchair Mobility    Modified Rankin (Stroke Patients Only) Modified Rankin  (Stroke Patients Only) Pre-Morbid Rankin Score: No symptoms Modified Rankin: Moderately severe disability     Balance Overall balance assessment: Needs assistance Sitting-balance support: Feet supported;Bilateral upper extremity supported Sitting balance-Leahy Scale: Poor Sitting balance - Comments: Requires BUE support sitting EOB due to truncal and LUE ataxia.       Standing balance comment: Deferred due to dizziness.                              Pertinent Vitals/Pain Pain Assessment: No/denies pain    Home Living Family/patient expects to be discharged to:: Inpatient rehab Living Arrangements: Alone Available Help at Discharge: Family;Available PRN/intermittently Type of Home: House Home Access: Stairs to enter Entrance Stairs-Rails: Right Entrance Stairs-Number of Steps: 4 Home Layout: One level Home Equipment: Walker - 2 wheels;Cane - single point      Prior Function Level of Independence: Independent         Comments: Drives, cooks, cleans. Cares for self.      Hand Dominance   Dominant Hand: Right    Extremity/Trunk Assessment   Upper Extremity Assessment Upper Extremity Assessment: Defer to OT evaluation;LUE deficits/detail LUE Deficits / Details: Ataxia noted during reaching for rail, touching nose.  LUE Sensation: WNL LUE Coordination: decreased fine motor;decreased gross motor    Lower Extremity Assessment Lower Extremity Assessment: LLE deficits/detail(Grossly ~4 out of 5 throughout) LLE Sensation: WNL LLE Coordination: decreased fine motor;decreased gross motor    Cervical / Trunk Assessment Cervical / Trunk Assessment:  Other exceptions Cervical / Trunk Exceptions: Truncal ataxic noted which subsided with approximation at trunk.  Communication   Communication: Expressive difficulties(possibly some slurred speech)  Cognition Arousal/Alertness: Lethargic;Suspect due to medications Behavior During Therapy: Flat affect Overall  Cognitive Status: Within Functional Limits for tasks assessed                                 General Comments: Seems WFL for basic mobility tasks. Slow to respond likely due to medication on board. Keeps eyes closed for most of session due to dizziness. Some nystagmus noted all over the place when opening eyes momentarily.       General Comments General comments (skin integrity, edema, etc.): VSS.    Exercises     Assessment/Plan    PT Assessment Patient needs continued PT services  PT Problem List Decreased balance;Decreased mobility;Decreased activity tolerance;Decreased coordination       PT Treatment Interventions DME instruction;Functional mobility training;Patient/family education;Balance training;Gait training;Therapeutic activities;Neuromuscular re-education;Therapeutic exercise;Stair training    PT Goals (Current goals can be found in the Care Plan section)  Acute Rehab PT Goals Patient Stated Goal: to get better, interested in rehab PT Goal Formulation: With patient Time For Goal Achievement: 11/28/18 Potential to Achieve Goals: Good    Frequency Min 4X/week   Barriers to discharge Decreased caregiver support      Co-evaluation               AM-PAC PT "6 Clicks" Mobility  Outcome Measure Help needed turning from your back to your side while in a flat bed without using bedrails?: A Little Help needed moving from lying on your back to sitting on the side of a flat bed without using bedrails?: A Lot Help needed moving to and from a bed to a chair (including a wheelchair)?: A Lot Help needed standing up from a chair using your arms (e.g., wheelchair or bedside chair)?: A Lot Help needed to walk in hospital room?: A Lot Help needed climbing 3-5 steps with a railing? : Total 6 Click Score: 12    End of Session   Activity Tolerance: Patient limited by lethargy;Other (comment)(dizziness) Patient left: in bed;with call bell/phone within  reach;with bed alarm set;with SCD's reapplied Nurse Communication: Mobility status;Other (comment)(RN notified of symptoms) PT Visit Diagnosis: Dizziness and giddiness (R42)    Time: 1323-1340 PT Time Calculation (min) (ACUTE ONLY): 17 min   Charges:   PT Evaluation $PT Eval Moderate Complexity: 1 Mod          Wray Kearns, PT, DPT Acute Rehabilitation Services Pager [phone] Office [place] 11/14/2018, 1:51 PM

## 2018-11-14 NOTE — Progress Notes (Signed)
Patient unable to void. Bladder scan shows 502 ml. Dr. Lorraine Lax confirmed ok to in and out cath at this time.

## 2018-11-14 NOTE — Progress Notes (Signed)
STROKE TEAM PROGRESS NOTE   HISTORY OF PRESENT ILLNESS (per record) Stephanie Love is a 83 y.o. female past medical history of COPD, hypertension, hypothyroidism,GERD resents to the emergency room at [facility] with sudden onset dizziness that began around 6:30 PM.  She states that it was sudden onset, did not vary with position.  Unable to state if the room was spinning.  She had nausea and vomited multiple times.  She went to [facility] and was evaluated by tele neurology.  On assessment, noted to have disconjugate gaze, dysarthria and left-sided ataxia.  Risk  versus benefits was discussed with patient and she is received IV TPA (start time was 9.22pm) .  Her blood pressure remained below 366 systolic per reports.  She was transferred to [facility] for further management.  ED work-up ( from outside records) CT head was performed at [facility] showed no acute findings.  CT angiogram was also performed there is no LVO.  PT/INR was 1.  CBC was within normal limits.  Electrolytes are within normal limits.  COVID test was negative.  Date last known well: 5 23-20 Time last known well: 6 PM tPA Given: Yes NIHSS: 3 (OSH) > 2 ( on arrival)  Baseline MRS   SUBJECTIVE (INTERVAL HISTORY) Her nurse is at bedside.  Patient is very nauseated, vomiting.  4 mg of Zofran did not improve symptoms, administered 8 mg.  Still complaining of symptoms will change to Compazine.  CT of the head was negative, MRI of the brain pending.  OBJECTIVE Vitals:   11/14/18 0700 11/14/18 0715 11/14/18 0730 11/14/18 0745  BP: (!) 147/76 (!) 151/77 (!) 152/78 (!) 147/78  Pulse: 91 95 95 91  Resp: 18 (!) 23 (!) 21 15  Temp:      TempSrc:      SpO2: 96% 98% 96% 97%  Weight:      Height:        CBC: No results for input(s): WBC, NEUTROABS, HGB, HCT, MCV, PLT in the last 168 hours.  Basic Metabolic Panel: No results for input(s): NA, K, CL, CO2, GLUCOSE, BUN, CREATININE, CALCIUM, MG, PHOS in the  last 168 hours.  Lipid Panel:     Component Value Date/Time   CHOL 124 11/14/2018 0434   TRIG 40 11/14/2018 0434   HDL 51 11/14/2018 0434   CHOLHDL 2.4 11/14/2018 0434   VLDL 8 11/14/2018 0434   LDLCALC 65 11/14/2018 0434   HgbA1c:  Lab Results  Component Value Date   HGBA1C 5.9 (H) 11/14/2018   Urine Drug Screen: No results found for: LABOPIA, COCAINSCRNUR, LABBENZ, AMPHETMU, THCU, LABBARB  Alcohol Level No results found for: ETH  IMAGING  MRI Brain Wo Contrast - pending   CT Head Wo Contrast - Completed at OSH, no acute findings. CTA without LVO also at [facility].    Transthoracic Echocardiogram  00/00/2020 Pending   EKG - not performed here    PHYSICAL EXAM Blood pressure (!) 147/78, pulse 91, temperature 98.2 F (36.8 C), temperature source Oral, resp. rate 15, height 5\' 2"  (1.575 m), weight 59 kg, SpO2 97 %.   Physical exam: Exam: Gen: vomiting, nauseated                   CV: RRR, no MRG. No Carotid Bruits. No peripheral edema, warm, nontender Eyes: Conjunctivae clear without exudates or hemorrhage  Neuro: Detailed Neurologic Exam  Speech:    Speech is normal; fluent and spontaneous with normal comprehension.  Cognition:  The patient is oriented to person, place, and time;     recent and remote memory appears intact;     language fluent;     normal attention, concentration, fund of knowledge Cranial Nerves:    The pupils are equal, round, and reactive to light. Visual fields are full to finger confrontation. Extraocular movements are intact. Trigeminal sensation is intact and the muscles of mastication are normal. The face is symmetric. The palate elevates in the midline. Hearing intact. Voice is normal. Shoulder shrug is normal. The tongue has normal motion without fasciculations.   Coordination:    Left-sided ataxia on FTN and HTS  Motor Observation:    No asymmetry, no atrophy, and no involuntary movements noted. Tone:    Normal muscle  tone.    Posture:    Posture is normal. normal erect    Strength:    Strength is V/V in the upper and lower limbs.      Sensation: intact to LT     Reflex Exam:  DTR's:    Deep tendon reflexes in the upper and lower extremities are normal bilaterally.   Toes:    The toes are downgoing bilaterally.   Clonus:    Clonus is absent.     ASSESSMENT/PLAN [name] is a 83 y.o. female with history of COPD, hypertension, anemia, hypothyroidism,GERD presenting with dizziness, disconjugate gaze, dysarthria and left-sided ataxia.  She received IV tPA at [facility] on 11/13/18 @ 9:22 PM  Stroke:  MRI pending  Resultant  Nausea,vomiting, left-sided ataxia suspect small-vessel brainstem stroke or cerebellar stroke  CT head/ CTA H&N - OSH - no acute findings, No large vessel occlusion.  CT Head repeat - pending  MRI head - pending  MRA head  - not ordered, CTA H&N completed at [facility].  CTA H&N - OSH - no LVO  Carotid Doppler - CTA neck performed - carotid dopplers not indicated.  2D Echo - pending  Lacey Jensen Virus 2 - OSH - negative  LDL - 65  HgbA1c - 5.9  UDS - not performed  VTE prophylaxis - SCDs  Diet  - Heart healthy with thin liquids.  No antithrombotic prior to admission, now on No antithrombotic  S/P tPA. ASA 325mg  after 24 hours.   Patient counseled to be compliant with her antithrombotic medications  Ongoing aggressive stroke risk factor management  Therapy recommendations:  pending  Disposition:  Pending  Hypertension  Stable . Permissive hypertension (OK if < 220/120) but gradually normalize in 5-7 days . Long-term BP goal normotensive  Hyperlipidemia  Lipid lowering medication PTA: none  LDL 65, goal < 70  Current lipid lowering medication: none - will add Lipitor 40 mg daily  Continue statin at discharge  Other Stroke Risk Factors  Advanced age  Other Active Problems  COPD   Hospital day # 0  Personally  examined patient and images, and have participated in and made any corrections needed to history, physical, neuro exam,assessment and plan as stated above.  I have personally obtained the history, evaluated lab date, reviewed imaging studies and agree with radiology interpretations.    Sarina Ill, MD Stroke Neurology   A total of 35 minutes was spent for the care of this patient, spent on counseling patient and family on different diagnostic and therapeutic options, counseling and coordination of care, riskd ans benefits of management, compliance, or risk factor reduction and education.   To contact Stroke Continuity provider, please refer to [email or web address] After hours, contact  General Neurology

## 2018-11-14 NOTE — Progress Notes (Signed)
Incruse inhaler and flonase left in patient room due to MRSA infection.

## 2018-11-15 ENCOUNTER — Inpatient Hospital Stay (HOSPITAL_COMMUNITY): Payer: Medicare Other

## 2018-11-15 ENCOUNTER — Other Ambulatory Visit: Payer: Self-pay

## 2018-11-15 ENCOUNTER — Encounter (HOSPITAL_COMMUNITY): Payer: Self-pay | Admitting: *Deleted

## 2018-11-15 DIAGNOSIS — I639 Cerebral infarction, unspecified: Secondary | ICD-10-CM

## 2018-11-15 MED ORDER — ASPIRIN EC 325 MG PO TBEC
325.0000 mg | DELAYED_RELEASE_TABLET | Freq: Every day | ORAL | Status: DC
  Administered 2018-11-15 – 2018-11-17 (×3): 325 mg via ORAL
  Filled 2018-11-15 (×3): qty 1

## 2018-11-15 MED ORDER — LABETALOL HCL 5 MG/ML IV SOLN
5.0000 mg | INTRAVENOUS | Status: DC | PRN
  Filled 2018-11-15: qty 4

## 2018-11-15 NOTE — Evaluation (Signed)
Speech Language Pathology Evaluation Patient Details Name: Stephanie Love MRN: [id number] DOB: 11-Aug-1935 Today's Date: 11/15/2018 Time: 0109-3235 SLP Time Calculation (min) (ACUTE ONLY): 18 min  Problem List:  Patient Active Problem List   Diagnosis Date Noted  . Ischemic stroke ([place]) 11/14/2018  . Hyperlipidemia 08/10/2018  . Aftercare following surgery 05/06/2016  . Hammer toe of right foot 09/20/2015   Past Medical History:  Past Medical History:  Diagnosis Date  . Anemia   . Arthritis   . COPD (chronic obstructive pulmonary disease) ([place])   . Diverticulosis   . GERD (gastroesophageal reflux disease)   . Hypertension   . Hypothyroid   . NASH (nonalcoholic steatohepatitis)   . Osteoarthritis   . Stroke ([place]) 11/13/2018  . UTI (urinary tract infection)    Past Surgical History:  Past Surgical History:  Procedure Laterality Date  . ABDOMINAL HYSTERECTOMY  2012  . CHOLECYSTECTOMY  2013   HPI:  Pt is an 83 y.o. female with history of COPD, hypertension, anemia, hypothyroidism, GERD presenting with dizziness, N/V, disconjugate gaze, dysarthria and left-sided ataxia.  She received IV tPA at [facility] on 11/13/18; transferred to [facility]. MRI of the breain revealed acute left superior cerebellar artery territory infarct. Punctate acute bilateral parietal and right frontal infarcts. Small volume right parieto-occipital subarachnoid emorrhage and sall right-sided subdural hematoma without mass effect.   Assessment / Plan / Recommendation Clinical Impression  Pt reported that she was living independently prior to admission. She denied any baseline or new deficits in speech, language or cognition. Based on today's assessment, her speech and language skills are currently within normal limits and no overt cognitive deficits were noted. Further skilled SLP services are not clinically indicated at this time. Pt and nursing  were educated regarding results and recommendations; both  parties verbalized understanding as well as agreement with plan of care.    SLP Assessment  SLP Recommendation/Assessment: Patient does not need any further Speech Lanaguage Pathology Services SLP Visit Diagnosis: Aphasia (R47.01)    Follow Up Recommendations  None    Frequency and Duration           SLP Evaluation Cognition  Overall Cognitive Status: Within Functional Limits for tasks assessed Arousal/Alertness: Awake/alert Orientation Level: Oriented X4 Attention: Focused;Sustained Focused Attention: Appears intact Sustained Attention: Appears intact Memory: Appears intact(Immediate: 3/3; Delayed: 3/3) Awareness: Appears intact Problem Solving: Appears intact(5/5) Executive Function: Reasoning;Sequencing Reasoning: Appears intact(3/3) Sequencing: Appears intact       Comprehension  Auditory Comprehension Overall Auditory Comprehension: Appears within functional limits for tasks assessed Yes/No Questions: Within Functional Limits(Simple: 5/5; Complex: 5/5; Paragraph: 4/4) Commands: Within Functional Limits Two Step Basic Commands: (4/4) Multistep Basic Commands: (3/3 with additional processing time. ) Conversation: Complex Reading Comprehension Reading Status: Within funtional limits    Expression Expression Primary Mode of Expression: Verbal Verbal Expression Overall Verbal Expression: Appears within functional limits for tasks assessed Initiation: No impairment Automatic Speech: Counting;Day of week;Month of year(WNL) Level of Generative/Spontaneous Verbalization: Sentence;Conversation Repetition: No impairment(5/5) Naming: No impairment Responsive: (5/5) Confrontation: Within functional limits(10/10) Convergent: (Sentence completion: 5/5) Pragmatics: No impairment   Oral / Motor  Oral Motor/Sensory Function Overall Oral Motor/Sensory Function: Within functional limits Motor Speech Overall Motor Speech: Appears within functional limits for tasks  assessed Respiration: Within functional limits Phonation: Normal Resonance: Within functional limits Articulation: Within functional limitis Intelligibility: Intelligible Motor Planning: Witnin functional limits Motor Speech Errors: Not applicable   Stephanie Love, [place], [place] Office number [phone] Pager [phone]  Horton Marshall 11/15/2018, 2:42 PM

## 2018-11-15 NOTE — Evaluation (Signed)
Occupational Therapy Evaluation Patient Details Name: Stephanie Love MRN: [id number] DOB: 18-Jun-1936 Today's Date: 11/15/2018    History of Present Illness Patient is a 83 yo female who presents from [facility] with NV and dizziness. sp tPA. NIH:3. Head CT-unremarkable. MRI: acute left superior cerebellar artery territory infarct, Punctate acute bilateral parietal and right frontal infarcts, Small volume right parieto-occipital subarachnoid hemorrhage and small right-sided subdural hematoma without mass effect. PMH includes COPD, HTN, NASH.   Clinical Impression   This 83 yo female admitted with above presents to acute OT with decreased mobility, decreased balance, ataxic left side and tendency to keep eyes shut (despite her saying that with eyes open she is not dizzy). She will benefit from acute OT with follow up OT on CIR to get to a S level or better.     Follow Up Recommendations  CIR;Supervision/Assistance - 24 hour    Equipment Recommendations  Other (comment)(TBD next venue)       Precautions / Restrictions Precautions Precautions: Fall Precaution Comments: ataxia Restrictions Weight Bearing Restrictions: No      Mobility Bed Mobility Overal bed mobility: Needs Assistance Bed Mobility: Rolling;Sidelying to Sit Rolling: Mod assist Sidelying to sit: Max assist;HOB elevated       General bed mobility comments: intially tendency for posterior lean  Transfers Overall transfer level: Needs assistance Equipment used: 2 person hand held assist Transfers: Sit to/from [employer] Transfers Sit to Stand: Max assist;+2 physical assistance Stand pivot transfers: Max assist;+2 physical assistance       General transfer comment: posterior lean with increased time and cues to get hips/trunk over feet    Balance Overall balance assessment: Needs assistance Sitting-balance support: Feet supported;Bilateral upper extremity supported Sitting balance-Leahy Scale:  Poor Sitting balance - Comments: Required intermittent Bil UE support while seated EOB, with moments where it was only single UE support   Standing balance support: Bilateral upper extremity supported Standing balance-Leahy Scale: Zero Standing balance comment: +2 Max A                           ADL either performed or assessed with clinical judgement   ADL Overall ADL's : Needs assistance/impaired   Eating/Feeding Details (indicate cue type and reason): min guard while seated EOB due to balance Grooming: Moderate assistance Grooming Details (indicate cue type and reason): supported sitting Upper Body Bathing: Minimal assistance Upper Body Bathing Details (indicate cue type and reason): supported sitting Lower Body Bathing: Total assistance Lower Body Bathing Details (indicate cue type and reason): Max A +2 sit<>stand due to posterior lean Upper Body Dressing : Moderate assistance Upper Body Dressing Details (indicate cue type and reason): supported sitting Lower Body Dressing: Total assistance Lower Body Dressing Details (indicate cue type and reason): Max A +2 sit<>stand due to posterior lean Toilet Transfer: Maximal assistance;+2 for physical assistance;Stand-pivot Toilet Transfer Details (indicate cue type and reason): bed>recliner going to pt's right Toileting- Clothing Manipulation and Hygiene: Total assistance Toileting - Clothing Manipulation Details (indicate cue type and reason): Max A +2 sit<>stand due to posterior lean             Vision Baseline Vision/History: No visual deficits Patient Visual Report: No change from baseline Additional Comments: Tracks without issues            Pertinent Vitals/Pain Pain Assessment: No/denies pain     Hand Dominance Right   Extremity/Trunk Assessment Upper Extremity Assessment LUE Deficits / Details: Ataxia noted during reaching  for rail, touching nose.  LUE Coordination: decreased fine motor;decreased gross  motor              Cognition Arousal/Alertness: Awake/alert Behavior During Therapy: Flat affect Overall Cognitive Status: Within Functional Limits for tasks assessed                                 General Comments: Eyes open 1/2 of session, would open them when asked--reports opening eyes does not make her dizzy so not sure whey she is keeping them closed at times              Home Living Family/patient expects to be discharged to:: Inpatient rehab Living Arrangements: Alone Available Help at Discharge: Family;Available PRN/intermittently Type of Home: House Home Access: Stairs to enter [employer] of Steps: 4 Entrance Stairs-Rails: Right Home Layout: One level               Home Equipment: Walker - 2 wheels;Cane - single point          Prior Functioning/Environment Level of Independence: Independent        Comments: Drives, cooks, cleans. Cares for self.         OT Problem List: Impaired balance (sitting and/or standing);Decreased coordination;Decreased safety awareness;Impaired UE functional use      OT Treatment/Interventions: Self-care/ADL training;Balance training;Therapeutic activities;Neuromuscular education;DME and/or AE instruction;Patient/family education    OT Goals(Current goals can be found in the care plan section) Acute Rehab OT Goals Patient Stated Goal: to hopefully to rehab OT Goal Formulation: With patient Time For Goal Achievement: 11/29/18 Potential to Achieve Goals: Good  OT Frequency: Min 3X/week   Barriers to D/C: Decreased caregiver support          Co-evaluation PT/OT/SLP Co-Evaluation/Treatment: Yes Reason for Co-Treatment: For patient/therapist safety;To address functional/ADL transfers PT goals addressed during session: Mobility/safety with mobility;Balance;Strengthening/ROM OT goals addressed during session: ADL's and self-care;Strengthening/ROM      AM-PAC OT "6 Clicks" Daily Activity      Outcome Measure Help from another person eating meals?: A Little Help from another person taking care of personal grooming?: A Lot Help from another person toileting, which includes using toliet, bedpan, or urinal?: A Lot Help from another person bathing (including washing, rinsing, drying)?: A Lot Help from another person to put on and taking off regular upper body clothing?: A Lot Help from another person to put on and taking off regular lower body clothing?: Total 6 Click Score: 7   End of Session Equipment Utilized During Treatment: Gait belt Nurse Communication: Mobility status  Activity Tolerance: Patient tolerated treatment well Patient left: in chair;with call bell/phone within reach;with chair alarm set  OT Visit Diagnosis: Unsteadiness on feet (R26.81);Other abnormalities of gait and mobility (R26.89);Ataxia, unspecified (R27.0);Feeding difficulties (R63.3);Hemiplegia and hemiparesis Hemiplegia - Right/Left: Left Hemiplegia - dominant/non-dominant: Non-Dominant                Time: 7672-0947 OT Time Calculation (min): 25 min Charges:  OT General Charges $OT Visit: 1 Visit OT Evaluation $OT Eval Moderate Complexity: 1 Mod  .[place] [address], OTR/L Acute Rehab Services Pager [phone] Office [phone]    Almon Register 11/15/2018, 9:55 AM

## 2018-11-15 NOTE — Progress Notes (Addendum)
STROKE TEAM PROGRESS NOTE   HISTORY OF PRESENT ILLNESS (per record) Stephanie Love is a 83 y.o. female past medical history of COPD, hypertension, hypothyroidism,GERD resents to the emergency room at [facility] with sudden onset dizziness that began around 6:30 PM.  She states that it was sudden onset, did not vary with position.  Unable to state if the room was spinning.  She had nausea and vomited multiple times.  She went to [facility] and was evaluated by tele neurology.  On assessment, noted to have disconjugate gaze, dysarthria and left-sided ataxia.  Risk  versus benefits was discussed with patient and she is received IV TPA (start time was 9.22pm) .  Her blood pressure remained below 902 systolic per reports.  She was transferred to [facility] for further management.  ED work-up ( from outside records) CT head was performed at [facility] showed no acute findings.  CT angiogram was also performed there is no LVO.  PT/INR was 1.  CBC was within normal limits. Electrolytes are within normal limits.  COVID test was negative.  Date last known well: 5 23-20 Time last known well: 6 PM tPA Given: Yes 9:22 pm 11/14/2018 NIHSS: 3 (OSH) > 2 ( on arrival)  Baseline MRS   SUBJECTIVE (INTERVAL HISTORY) Her nurse is at bedside.  Patient feels improved, she is up out of bed. Embolic appearing strokes wwith superimposed small amount of bleed.    OBJECTIVE Vitals:   11/15/18 0500 11/15/18 0600 11/15/18 0700 11/15/18 0730  BP: (!) 126/56 (!) 131/54 139/66   Pulse: 94 87 89 93  Resp: 12 14 14 18   Temp:      TempSrc:      SpO2: 96% 96% 95% 97%  Weight:      Height:        CBC: No results for input(s): WBC, NEUTROABS, HGB, HCT, MCV, PLT in the last 168 hours.  Basic Metabolic Panel: No results for input(s): NA, K, CL, CO2, GLUCOSE, BUN, CREATININE, CALCIUM, MG, PHOS in the last 168 hours.  Lipid Panel:     Component Value Date/Time   CHOL 124 11/14/2018 0434   TRIG  40 11/14/2018 0434   HDL 51 11/14/2018 0434   CHOLHDL 2.4 11/14/2018 0434   VLDL 8 11/14/2018 0434   LDLCALC 65 11/14/2018 0434   HgbA1c:  Lab Results  Component Value Date   HGBA1C 5.9 (H) 11/14/2018   Urine Drug Screen: No results found for: LABOPIA, COCAINSCRNUR, LABBENZ, AMPHETMU, THCU, LABBARB  Alcohol Level No results found for: ETH  IMAGING  CT angiogram at OSH - no LVO.   MRI Brain Wo Contrast - IMPRESSION: 1. 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 2. Punctate acute bilateral parietal and right frontal infarcts. 3. Small volume right parieto-occipital subarachnoid hemorrhage and small right-sided subdural hematoma without mass effect  CT Head Wo Contrast - Completed at OSH, no acute findings.    Transthoracic Echocardiogram  00/00/2020 Pending   EKG - not performed here  MRI of the brain 11/14/2018: IMPRESSION: 1. 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 2. Punctate acute bilateral parietal and right frontal infarcts. 3. Small volume right parieto-occipital subarachnoid hemorrhage and small right-sided subdural hematoma without mass effect  PHYSICAL EXAM Blood pressure 139/66, pulse 93, temperature 98.6 F (37 C), temperature source Oral, resp. rate 18, height 5\' 2"  (1.575 m), weight 59 kg, SpO2 97 %.   Physical exam: Exam: Gen: NAD, nausea improved  CV: RRR, no MRG. No Carotid Bruits. No peripheral edema, warm, nontender Eyes: Conjunctivae clear without exudates or hemorrhage  Neuro: Detailed Neurologic Exam  Speech:    Speech is normal; fluent and spontaneous with normal comprehension.  Cognition:    The patient is oriented to person, place, and time;     recent and remote memory appears intact;     language fluent;     normal attention, concentration, fund of knowledge Cranial Nerves:    The pupils are equal, round, and reactive to light. Visual fields are full to finger confrontation. Extraocular  movements are intact. Trigeminal sensation is intact and the muscles of mastication are normal. The face is symmetric. The palate elevates in the midline. Hearing intact. Voice is normal. Shoulder shrug is normal. The tongue has normal motion without fasciculations.   Coordination:    Left-sided ataxia on FTN and HTS  Motor Observation:    No asymmetry, no atrophy, and no involuntary movements noted. Tone:    Normal muscle tone.    Posture:    Posture is normal. normal erect    Strength:    Strength is V/V in the upper and lower limbs.      Sensation: intact to LT     Reflex Exam:  DTR's:    Deep tendon reflexes in the upper and lower extremities are normal bilaterally.   Toes:    The toes are downgoing bilaterally.   Clonus:    Clonus is absent.     ASSESSMENT/PLAN [name] is a 83 y.o. female with history of COPD, hypertension, anemia, hypothyroidism,GERD presenting with dizziness, disconjugate gaze, dysarthria and left-sided ataxia.  She received IV tPA at [facility] on 11/14/18 @ 9:22 PM   Stroke:  Embolic, unknown etiology  Resultant  Nausea,vomiting, left-sided ataxia suspect small-vessel brainstem stroke or cerebellar stroke  CT head/ CTA H&N - OSH - no acute findings, No large vessel occlusion.  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  2D Echo - pending  [employer] Virus 2 - OSH - negative  LDL - 65  HgbA1c - 5.9  UDS - not performed  VTE prophylaxis - SCDs  Diet  - Heart healthy with thin liquids.  No antithrombotic prior to admission, now on No antithrombotic  S/P tPA. ASA 325mg  after 24  hours. Start ASA 325mg  after 24 hours.   Patient counseled to be compliant with her antithrombotic medications  Ongoing aggressive stroke risk factor management  Therapy recommendations:  pending  Disposition:  Pending  Hypertension  Stable . Permissive hypertension (OK if < 220/120) but gradually normalize in 5-7 days . Long-term BP goal normotensive  Hyperlipidemia  Lipid lowering medication PTA: none  LDL 65, goal < 70  Current lipid lowering medication none due to liver disease  Other Stroke Risk Factors  Advanced age  Other Active Problems  COPD  Will need TEE and loop. Cryptogenic embolic stroke. Updated patient who had no further questions. Will call daughter. Will order LE dopplers. Make NPO after midnight.   Hospital day # 1  Personally examined patient and images, and have participated in and made any corrections needed to history, physical,  neuro exam,assessment and plan as stated above.  I have personally obtained the history, evaluated lab date, reviewed imaging studies and agree with radiology interpretations.    Sarina Ill, MD Stroke Neurology   A total of 25 minutes was spent for the care of this patient, spent on counseling patient and family on different diagnostic and therapeutic options, counseling and coordination of care, riskd ans benefits of management, compliance, or risk factor reduction and education.   To contact Stroke Continuity provider, please refer to [email or web address] After hours, contact General Neurology

## 2018-11-15 NOTE — Progress Notes (Signed)
Physical Therapy Treatment Patient Details Name: Stephanie Love MRN: [id number] DOB: 1935-12-24 Today's Date: 11/15/2018    History of Present Illness Patient is a 83 yo female who presents from [facility] with NV and dizziness. sp tPA. NIH:3. Head CT-unremarkable. MRI: acute left superior cerebellar artery territory infarct, Punctate acute bilateral parietal and right frontal infarcts, Small volume right parieto-occipital subarachnoid hemorrhage and small right-sided subdural hematoma without mass effect. PMH includes COPD, HTN, NASH.    PT Comments    Patient progressing well towards PT goals. Worked on sitting balance with less UE support and decreasing ataxia. Pt keeping eyes closed for half of session- reports she does not get dizzy with eyes opened so unsure why. Tolerated standing and taking a few steps to get to chair with heavy posterior lean and difficulty sequencing LEs to step. Would be a great CIR candidate. Plan for gait training next session as tolerated. Will follow.    Follow Up Recommendations  CIR;Supervision for mobility/OOB;Supervision/Assistance - 24 hour     Equipment Recommendations  None recommended by PT    Recommendations for Other Services Rehab consult     Precautions / Restrictions Precautions Precautions: Fall Precaution Comments: ataxia Restrictions Weight Bearing Restrictions: No    Mobility  Bed Mobility Overal bed mobility: Needs Assistance Bed Mobility: Rolling;Sidelying to Sit Rolling: Mod assist Sidelying to sit: Max assist;HOB elevated       General bed mobility comments: intially tendency for posterior lean, does better having BUE support to decrease ataxia.  Transfers Overall transfer level: Needs assistance Equipment used: 2 person hand held assist Transfers: Sit to/from [facility] Sit to Stand: Max assist;+2 physical assistance Stand pivot transfers: Max assist;+2 physical assistance       General transfer  comment: posterior lean with increased time and cues to get hips/trunk over feet.   Ambulation/Gait             General Gait Details: Deferred   Stairs             Wheelchair Mobility    Modified Rankin (Stroke Patients Only) Modified Rankin (Stroke Patients Only) Pre-Morbid Rankin Score: No symptoms Modified Rankin: Moderately severe disability     Balance Overall balance assessment: Needs assistance Sitting-balance support: Feet supported;Bilateral upper extremity supported Sitting balance-Leahy Scale: Poor Sitting balance - Comments: Required intermittent Bil UE support while seated EOB, with moments where it was only single UE support. Able to initiate spinal extensors for upright posture. Worked on breathing techniques sitting EOB.   Standing balance support: Bilateral upper extremity supported Standing balance-Leahy Scale: Zero Standing balance comment: +2 Max A due to posterior lean                            Cognition Arousal/Alertness: Awake/alert Behavior During Therapy: Flat affect Overall Cognitive Status: Within Functional Limits for tasks assessed                                 General Comments: Eyes open 1/2 of session, would open them when asked--reports opening eyes does not make her dizzy so not sure whey she is keeping them closed at times      Exercises      General Comments General comments (skin integrity, edema, etc.): VSS.      Pertinent Vitals/Pain Pain Assessment: No/denies pain    Home Living Family/patient expects to be discharged to::  Inpatient rehab Living Arrangements: Alone Available Help at Discharge: Family;Available PRN/intermittently Type of Home: House Home Access: Stairs to enter Entrance Stairs-Rails: Right Home Layout: One level Home Equipment: Walker - 2 wheels;Cane - single point      Prior Function Level of Independence: Independent      Comments: Drives, cooks, cleans. Cares  for self.    PT Goals (current goals can now be found in the care plan section) Acute Rehab PT Goals Patient Stated Goal: to hopefully to rehab Progress towards PT goals: Progressing toward goals    Frequency    Min 4X/week      PT Plan Current plan remains appropriate    Co-evaluation PT/OT/SLP Co-Evaluation/Treatment: Yes Reason for Co-Treatment: To address functional/ADL transfers;For patient/therapist safety PT goals addressed during session: Mobility/safety with mobility;Balance;Strengthening/ROM OT goals addressed during session: ADL's and self-care;Strengthening/ROM      AM-PAC PT "6 Clicks" Mobility   Outcome Measure  Help needed turning from your back to your side while in a flat bed without using bedrails?: A Little Help needed moving from lying on your back to sitting on the side of a flat bed without using bedrails?: A Lot Help needed moving to and from a bed to a chair (including a wheelchair)?: A Lot Help needed standing up from a chair using your arms (e.g., wheelchair or bedside chair)?: A Lot Help needed to walk in hospital room?: A Lot Help needed climbing 3-5 steps with a railing? : Total 6 Click Score: 12    End of Session Equipment Utilized During Treatment: Gait belt Activity Tolerance: Patient tolerated treatment well Patient left: in chair;with call bell/phone within reach;with chair alarm set Nurse Communication: Mobility status PT Visit Diagnosis: Dizziness and giddiness (R42);Unsteadiness on feet (R26.81);Difficulty in walking, not elsewhere classified (R26.2)     Time: 1779-3903 PT Time Calculation (min) (ACUTE ONLY): 25 min  Charges:  $Neuromuscular Re-education: 8-22 mins                     Wray Kearns, PT, DPT Acute Rehabilitation Services Pager [phone] Office [phone]       [place] 11/15/2018, 11:23 AM

## 2018-11-15 NOTE — Progress Notes (Signed)
Assisted tele visit to patient with son.  Jaskirat Zertuche M, RN  

## 2018-11-15 NOTE — Progress Notes (Signed)
Bilateral lower extremity venous duplex has been completed. Preliminary results can be found in CV Proc through chart review.   11/15/18 4:29 PM Stephanie Love RVT

## 2018-11-15 NOTE — Progress Notes (Signed)
Assisted tele visit to patient with daughter.  Kamiah Fite M, RN  

## 2018-11-16 ENCOUNTER — Encounter (HOSPITAL_COMMUNITY): Payer: Self-pay | Admitting: Internal Medicine

## 2018-11-16 ENCOUNTER — Encounter (HOSPITAL_COMMUNITY)
Admission: AD | Disposition: A | Discharge status: Rehabilitation Facility | Payer: Self-pay | Source: Other Acute Inpatient Hospital | Attending: Neurology

## 2018-11-16 DIAGNOSIS — N182 Chronic kidney disease, stage 2 (mild): Secondary | ICD-10-CM | POA: Diagnosis not present

## 2018-11-16 DIAGNOSIS — I129 Hypertensive chronic kidney disease with stage 1 through stage 4 chronic kidney disease, or unspecified chronic kidney disease: Secondary | ICD-10-CM | POA: Diagnosis not present

## 2018-11-16 DIAGNOSIS — I6389 Other cerebral infarction: Secondary | ICD-10-CM

## 2018-11-16 DIAGNOSIS — E785 Hyperlipidemia, unspecified: Secondary | ICD-10-CM | POA: Diagnosis not present

## 2018-11-16 DIAGNOSIS — J449 Chronic obstructive pulmonary disease, unspecified: Secondary | ICD-10-CM | POA: Diagnosis not present

## 2018-11-16 HISTORY — PX: LOOP RECORDER INSERTION: EP1214

## 2018-11-16 LAB — ECHOCARDIOGRAM COMPLETE
Height: 62 in
Weight: 2081.14 oz

## 2018-11-16 SURGERY — LOOP RECORDER INSERTION

## 2018-11-16 MED ORDER — LIDOCAINE-EPINEPHRINE 1 %-1:100000 IJ SOLN
INTRAMUSCULAR | Status: DC | PRN
  Administered 2018-11-16: 15 mL

## 2018-11-16 MED ORDER — LIDOCAINE-EPINEPHRINE 1 %-1:100000 IJ SOLN
INTRAMUSCULAR | Status: AC
  Filled 2018-11-16: qty 1

## 2018-11-16 SURGICAL SUPPLY — 2 items
LOOP REVEAL LINQSYS (Prosthesis & Implant Heart) ×2 IMPLANT
PACK LOOP INSERTION (CUSTOM PROCEDURE TRAY) ×2 IMPLANT

## 2018-11-16 NOTE — Discharge Instructions (Signed)
Implant site/wound care instructions °Keep incision clean and dry for 3 days. °You can remove outer dressing tomorrow. °Leave steri-strips (little pieces of tape) on until seen in the office for wound check appointment. °Call the office ([phone]) for redness, drainage, swelling, or fever. ° °

## 2018-11-16 NOTE — Progress Notes (Signed)
STROKE TEAM PROGRESS NOTE       SUBJECTIVE (INTERVAL HISTORY) Patient is well.  She has no complaints.  He is scheduled for loop recorder insertion later today.   OBJECTIVE Vitals:   11/16/18 0802 11/16/18 1144 11/16/18 1300 11/16/18 1607  BP: 128/69 (!) 141/74 (!) 143/74 (!) 145/72  Pulse: 82 83 94 (!) 104  Resp: 20 16 17 20   Temp: 98.6 F (37 C) 99.3 F (37.4 C) 99.1 F (37.3 C) 98.5 F (36.9 C)  TempSrc: Oral Oral Oral Oral  SpO2: 96% 97% 98% 97%  Weight:      Height:        CBC: No results for input(s): WBC, NEUTROABS, HGB, HCT, MCV, PLT in the last 168 hours.  Basic Metabolic Panel: No results for input(s): NA, K, CL, CO2, GLUCOSE, BUN, CREATININE, CALCIUM, MG, PHOS in the last 168 hours.  Lipid Panel:     Component Value Date/Time   CHOL 124 11/14/2018 0434   TRIG 40 11/14/2018 0434   HDL 51 11/14/2018 0434   CHOLHDL 2.4 11/14/2018 0434   VLDL 8 11/14/2018 0434   LDLCALC 65 11/14/2018 0434   HgbA1c:  Lab Results  Component Value Date   HGBA1C 5.9 (H) 11/14/2018   Urine Drug Screen: No results found for: LABOPIA, COCAINSCRNUR, LABBENZ, AMPHETMU, THCU, LABBARB  Alcohol Level No results found for: ETH  IMAGING  CT angiogram at OSH - no LVO.   MRI Brain Wo Contrast - IMPRESSION: 1. 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 2. Punctate acute bilateral parietal and right frontal infarcts. 3. Small volume right parieto-occipital subarachnoid hemorrhage and small right-sided subdural hematoma without mass effect  CT Head Wo Contrast - Completed at OSH, no acute findings.    Transthoracic Echocardiogram  00/00/2020 Pending   EKG - not performed here  MRI of the brain 11/14/2018: IMPRESSION: 1. 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 2. Punctate acute bilateral parietal and right frontal infarcts. 3. Small volume right parieto-occipital subarachnoid hemorrhage and small right-sided subdural hematoma without mass  effect  PHYSICAL EXAM Blood pressure (!) 145/72, pulse (!) 104, temperature 98.5 F (36.9 C), temperature source Oral, resp. rate 20, height 5\' 2"  (1.575 m), weight 59 kg, SpO2 97 %. Pleasant elderly lady not in distress. . Afebrile. Head is nontraumatic. Neck is supple without bruit.    Cardiac exam no murmur or gallop. Lungs are clear to auscultation. Distal pulses are well felt.   Neurological Exam ;  Awake  Alert oriented x 3. Normal speech and language.eye movements full without nystagmus.fundi were not visualized. Vision acuity and fields appear normal. Hearing is normal. Palatal movements are normal. Face symmetric. Tongue midline. Normal strength, tone, reflexes . Mildly impaired left finger-to-nose coordination. Normal sensation. Gait deferred.      ASSESSMENT/PLAN [name] is a 83 y.o. female with history of COPD, hypertension, anemia, hypothyroidism,GERD presenting with dizziness, disconjugate gaze, dysarthria and left-sided ataxia.  She received IV tPA at [facility] on 11/14/18 @ 9:22 PM   Stroke:  Embolic, unknown etiology  Resultant  Nausea,vomiting, left-sided ataxia suspect small-vessel brainstem stroke or cerebellar stroke  CT head/ CTA H&N - OSH - no acute findings, No large vessel occlusion.  Will need TEE and Loop  MRI head - Acute left superior cerebellar artery territory infarct, acute bilateral parietal and right frontal infarcts, Small volume right parieto-occipital subarachnoid hemorrhage and small right-sided subdural hematoma without mass effect  MRA head - not performed  CTA H&N -  OSH - no LVO  Carotid Doppler - CTA neck performed - carotid dopplers not indicated.  2D Echo -normal ejection fraction 60 to 65%.  [employer] Virus 2 - OSH - negative  LDL - 65  HgbA1c - 5.9  UDS - not performed  VTE prophylaxis - SCDs  Diet  - Heart healthy with thin liquids.  No antithrombotic prior to admission, now on No antithrombotic  S/P  tPA. ASA 325mg  after 24 hours. Start ASA 325mg  after 24 hours.   Patient counseled to be compliant with her antithrombotic medications  Ongoing aggressive stroke risk factor management  Therapy recommendations:  CLR  Disposition:  Pending  Hypertension  Stable . Permissive hypertension (OK if < 220/120) but gradually normalize in 5-7 days . Long-term BP goal normotensive  Hyperlipidemia  Lipid lowering medication PTA: none  LDL 65, goal < 70  Current lipid lowering medication none due to liver disease  Other Stroke Risk Factors  Advanced age  Other Active Problems  COPD I have personally obtained history,examined this patient, reviewed notes, independently viewed imaging studies, participated in medical decision making and plan of care.ROS completed by me personally and pertinent positives fully documented  I have made any additions or clarifications directly to the above note.    Will need   loop.  Today as TEE is being delayed and may not be possible for several days for cryptogenic embolic stroke. Updated patient and her daughter over the phone who had no further questions. Will call daughter.   Hospital day # 2   A total of 25 minutes was spent for the care of this patient, spent on counseling patient and family on different diagnostic and therapeutic options, counseling and coordination of care, riskd ans benefits of management, compliance, or risk factor reduction and education.   Antony Contras, MD Medical Director [place] Pager: [phone] 11/16/2018 4:39 PM  To contact Stroke Continuity provider, please refer to [email or web address] After hours, contact General Neurology

## 2018-11-16 NOTE — Progress Notes (Signed)
Rehab Admissions Coordinator Note:  Per PT and OT recommendation, this patient was screened by Jhonnie Garner for appropriateness for an Inpatient Acute Rehab Consult.  At this time, we are recommending Inpatient Rehab consult. AC will contact MD for order.   Jhonnie Garner 11/16/2018, 7:50 AM  I can be reached at [phone].

## 2018-11-16 NOTE — Consult Note (Signed)
ELECTROPHYSIOLOGY CONSULT NOTE  Patient ID: Stephanie Love MRN: [id number], DOB/AGE: 1936-04-01   Admit date: 11/14/2018 Date of Consult: 11/16/2018  Primary Physician: Marco Collie, MD Primary Cardiologist: none Reason for Consultation: Cryptogenic stroke ; recommendations regarding Implantable Loop Recorder, requested by Dr. Leonie Man  History of Present Illness Stephanie Love was initially evaluated at [facility] with visual changes, difficult speech, and ataxia, transferred and admitted on 11/14/2018 with stroke, and treated with TPA  PMHx includes COPD, HTN, hypothyroidism, GERD,   Neurology noted Stroke:  Embolic, unknown etiology, suspect small-vessel brainstem stroke or cerebellar stroke .  she has undergone workup for stroke including echocardiogram and carotid angio at [facility] with no reported large vessel occlusion.  The patient has been monitored on telemetry which has demonstrated sinus rhythm with no arrhythmias.     Echocardiogram this admission demonstrated IMPRESSIONS 1. The left ventricle has normal systolic function with an ejection fraction of 60-65%. The cavity size was normal. Left ventricular diastolic Doppler parameters are consistent with impaired relaxation. No evidence of left ventricular regional wall  motion abnormalities.  2. The right ventricle has normal systolic function. The cavity was normal. There is no increase in right ventricular wall thickness. Right ventricular systolic pressure is mildly elevated with an estimated pressure of 33.9 mmHg.  3. Tricuspid valve regurgitation is mild-moderate.  4. The aortic valve is tricuspid. Mild thickening of the aortic valve. No stenosis of the aortic valve.  FINDINGS  Left Ventricle: The left ventricle has normal systolic function, with an ejection fraction of 60-65%. The cavity size was normal. There is no increase in left ventricular wall thickness. Left ventricular diastolic Doppler parameters are  consistent with  impaired relaxation. Normal left ventricular filling pressures No evidence of left ventricular regional wall motion abnormalities..  Right Ventricle: The right ventricle has normal systolic function. The cavity was normal. There is no increase in right ventricular wall thickness. Right ventricular systolic pressure is mildly elevated with an estimated pressure of 33.9 mmHg.  Left Atrium: Left atrial size was normal in size.  Right Atrium: Right atrial size was normal in size. Right atrial pressure is estimated at 3 mmHg.  Interatrial Septum: No atrial level shunt detected by color flow Doppler.  Pericardium: There is no evidence of pericardial effusion.  Mitral Valve: The mitral valve is normal in structure. Mitral valve regurgitation is not visualized by color flow Doppler.  Tricuspid Valve: The tricuspid valve is normal in structure. Tricuspid valve regurgitation is mild-moderate by color flow Doppler.  Aortic Valve: The aortic valve is tricuspid Mild thickening of the aortic valve. Aortic valve regurgitation was not visualized by color flow Doppler. There is No stenosis of the aortic valve.  Pulmonic Valve: The pulmonic valve was grossly normal. Pulmonic valve regurgitation is not visualized by color flow Doppler.     Lab work is reviewed. ([place]) H/H 7.1/23.8 SARS-CoV-2 RNA negative    Prior to admission, the patient denies chest pain, shortness of breath, dizziness, palpitations, or syncope.  They are recovering from their stroke with plans to discharge.       Past Medical History:  Diagnosis Date  . Anemia   . Arthritis   . COPD (chronic obstructive pulmonary disease) ([place])   . Diverticulosis   . GERD (gastroesophageal reflux disease)   . Hypertension   . Hypothyroid   . NASH (nonalcoholic steatohepatitis)   . Osteoarthritis   . Stroke ([place]) 11/13/2018  . UTI (urinary tract infection)      Surgical  History:  Past Surgical  History:  Procedure Laterality Date  . ABDOMINAL HYSTERECTOMY  2012  . CHOLECYSTECTOMY  2013     Medications Prior to Admission  Medication Sig Dispense Refill Last Dose  . acetaminophen (TYLENOL) 325 MG tablet Take 325 mg by mouth every 6 (six) hours as needed for mild pain.    Past Month at Unknown time  . arformoterol (BROVANA) 15 MCG/2ML NEBU Take 15 mcg by nebulization every 12 (twelve) hours.   11/14/2018 at Unknown time  . cholecalciferol (VITAMIN D3) 25 MCG (1000 UT) tablet Take 1,000 Units by mouth daily.   11/14/2018 at Unknown time  . escitalopram (LEXAPRO) 10 MG tablet Take 10 mg by mouth daily.    11/14/2018 at Unknown time  . fluticasone (FLONASE) 50 MCG/ACT nasal spray Place 1-2 sprays into both nostrils daily as needed for allergies or rhinitis.   Past Month at Unknown time  . lansoprazole (PREVACID) 15 MG capsule Take 15 mg by mouth daily at 12 noon.   11/14/2018 at Unknown time  . levothyroxine (SYNTHROID, LEVOTHROID) 112 MCG tablet Take 112 mcg by mouth daily before breakfast.   11/14/2018 at Unknown time  . losartan (COZAAR) 25 MG tablet Take 25 mg by mouth daily.   11/14/2018 at Unknown time  . metoprolol succinate (TOPROL-XL) 25 MG 24 hr tablet Take 25 mg by mouth daily.   11/14/2018 at 0800  . roflumilast (DALIRESP) 500 MCG TABS tablet Take 500 mcg by mouth daily.    11/14/2018 at Unknown time  . Tiotropium Bromide Monohydrate (SPIRIVA RESPIMAT) 1.25 MCG/ACT AERS Inhale 2 puffs into the lungs daily.   11/14/2018 at Unknown time    Inpatient Medications:  . aspirin EC  325 mg Oral Daily  . Chlorhexidine Gluconate Cloth  6 each Topical Q0600  . levothyroxine  112 mcg Oral QAC breakfast  . mupirocin ointment  1 application Nasal BID  . pantoprazole (PROTONIX) IV  40 mg Intravenous QHS  . roflumilast  500 mcg Oral Daily  . senna  1 tablet Oral Daily  . umeclidinium bromide  1 puff Inhalation Daily    Allergies:  Allergies  Allergen Reactions  . Penicillins Anaphylaxis     Did it involve swelling of the face/tongue/throat, SOB, or low BP? yes Did it involve sudden or severe rash/hives, skin peeling, or any reaction on the inside of your mouth or nose? no Did you need to seek medical attention at a hospital or doctor's office? yes When did it last happen?pt was 22 If all above answers are "NO", may proceed with cephalosporin use.   . Codeine   . Vicodin [Hydrocodone-Acetaminophen]     Social History   Socioeconomic History  . Marital status: Widowed    Spouse name: Not on file  . Number of children: Not on file  . Years of education: Not on file  . Highest education level: Not on file  Occupational History  . Not on file  Social Needs  . Financial resource strain: Not very hard  . Food insecurity:    Worry: Never true    Inability: Never true  . Transportation needs:    Medical: No    Non-medical: No  Tobacco Use  . Smoking status: Never Smoker  . Smokeless tobacco: Never Used  Substance and Sexual Activity  . Alcohol use: Not Currently  . Drug use: Never  . Sexual activity: Not on file  Lifestyle  . Physical activity:    Days per week: 0  days    Minutes per session: 0 min  . Stress: Not at all  Relationships  . Social connections:    Talks on phone: Twice a week    Gets together: Twice a week    Attends religious service: 1 to 4 times per year    Active member of club or organization: Yes    Attends meetings of clubs or organizations: 1 to 4 times per year    Relationship status: Widowed  . Intimate partner violence:    Fear of current or ex partner: Patient refused    Emotionally abused: Patient refused    Physically abused: Patient refused    Forced sexual activity: Patient refused  Other Topics Concern  . Not on file  Social History Narrative  . Not on file     Family: Mother HTN, cancer,  Father DM, heart disease,    Review of Systems: All other systems reviewed and are otherwise negative except as noted above.   Physical Exam: Vitals:   11/15/18 1951 11/16/18 0002 11/16/18 0433 11/16/18 0802  BP: 130/76 140/70 129/70 128/69  Pulse: (!) 101 91 90 82  Resp: 14 10 11 20   Temp: 98.3 F (36.8 C) 98.9 F (37.2 C) 98.6 F (37 C) 98.6 F (37 C)  TempSrc: Oral Oral Oral Oral  SpO2: 96% 96% 95% 96%  Weight:      Height:        Limited exam given COVID19 pandemic GEN- The patient is elderly and frail appearing, alert and oriented x 3 today.   Head- normocephalic, atraumatic Eyes-  Sclera clear, conjunctiva pink Ears- hearing intact Oropharynx- clear Neck- supple Lungs-  normal work of breathing Heart- RRR GI- soft Extremities- no clubbing, cyanosis, or edema MS- no obvious significant deformity or atrophy Skin- no rash or lesion Psych- euthymic mood, full affect   Labs:  No results found for: WBC, HGB, HCT, MCV, PLT No results for input(s): NA, K, CL, CO2, BUN, CREATININE, CALCIUM, PROT, BILITOT, ALKPHOS, ALT, AST, GLUCOSE in the last 168 hours.  Invalid input(s): LABALBU No results found for: CKTOTAL, CKMB, CKMBINDEX, TROPONINI Lab Results  Component Value Date   CHOL 124 11/14/2018   Lab Results  Component Value Date   HDL 51 11/14/2018   Lab Results  Component Value Date   LDLCALC 65 11/14/2018   Lab Results  Component Value Date   TRIG 40 11/14/2018   Lab Results  Component Value Date   CHOLHDL 2.4 11/14/2018   No results found for: LDLDIRECT  No results found for: DDIMER   Radiology/Studies:   Mr Brain Wo Contrast Result Date: 11/14/2018 CLINICAL DATA:  Stroke follow-up. Dizziness. Dysconjugate gaze, dysarthria, and left-sided ataxia. Received IV tPA. EXAM: MRI HEAD WITHOUT CONTRAST TECHNIQUE: Multiplanar, multiecho pulse sequences of the brain and surrounding structures were obtained without intravenous contrast. COMPARISON:  None. FINDINGS: Brain: There is a 3 cm acute infarct superiorly in the left cerebellum. Punctate acute infarcts are present involving  bilateral parietal cortex and right frontal white matter. There is small volume subarachnoid hemorrhage in the right occipital and right parietal lobes, and there is also a small subdural hematoma over the posterior right cerebral convexity measuring up to 3 mm in thickness without mass effect. The ventricles are normal in size. Minimal chronic small vessel ischemic changes are present in the cerebral white matter, not greater than expected for age. Vascular: Major intracranial vascular flow voids are preserved. Skull and upper cervical spine: No suspicious marrow lesion.  Sinuses/Orbits: Clear paranasal sinuses. Moderate right mastoid effusion. Other: None. IMPRESSION: 1. 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 2. Punctate acute bilateral parietal and right frontal infarcts. 3. Small volume right parieto-occipital subarachnoid hemorrhage and small right-sided subdural hematoma without mass effect. Critical Value/emergent results were called by telephone at the time of interpretation on 11/14/2018 at 10:25 pm to Dr. Samara Snide , who verbally acknowledged these results. Electronically Signed   By: Logan Bores M.D.   On: 11/14/2018 22:25     Vas [place] Lower Extremity Venous (dvt) Result Date: 11/16/2018  Lower Venous Study Indications: Embolic stroke.  Performing Technologist: Oliver Hum RVT  Examination Guidelines: A complete evaluation includes B-mode imaging, spectral Doppler, color Doppler, and power Doppler as needed of all accessible portions of each vessel. Bilateral testing is considered an integral part of a complete examination. Limited examinations for reoccurring indications may be performed as noted.  Summary: Right: There is no evidence of deep vein thrombosis in the lower extremity. No cystic structure found in the popliteal fossa. Left: There is no evidence of deep vein thrombosis in the lower extremity. No cystic structure found in the popliteal fossa.  *See table(s) above  for measurements and observations. Electronically signed by Deitra Mayo MD on 11/16/2018 at 6:30:14 AM.    Final    12-lead ECG EKG from [facility] is SR None in EPIC   Telemetry sinus  Assessment and Plan:  1. Cryptogenic stroke The patient presents with cryptogenic stroke.  I spoke at length with the patient about monitoring for afib with an implantable loop recorder.  Risks, benefits, and alteratives to implantable loop recorder were discussed with the patient today.   At this time, the patient is very clear in their decision to proceed with implantable loop recorder.   Wound care was reviewed with the patient (keep incision clean and dry for 3 days).    Please call with questions.  Thompson Grayer MD, [facility] 11/16/2018 11:22 AM

## 2018-11-16 NOTE — Progress Notes (Signed)
PT Cancellation Note  Patient Details Name: Stephanie Love MRN: [id number] DOB: 11-Mar-1936   Cancelled Treatment:    Reason Eval/Treat Not Completed: (P) Patient at procedure or test/unavailable(Pt off unit for loop recorder placement. Will defer PT today. )   Cristela Blue 11/16/2018, 12:35 PM  Sultana Tierney Stann Mainland, PTA Acute Rehabilitation Services Pager [phone] Office [phone]

## 2018-11-16 NOTE — Interval H&P Note (Signed)
History and Physical Interval Note:  11/16/2018 12:01 PM  Stephanie Love  has presented today for surgery, with the diagnosis of stroke.  The various methods of treatment have been discussed with the patient and family. After consideration of risks, benefits and other options for treatment, the patient has consented to  Procedure(s): LOOP RECORDER INSERTION (N/A) as a surgical intervention.  The patient's history has been reviewed, patient examined, no change in status, stable for surgery.  I have reviewed the patient's chart and labs.  Questions were answered to the patient's satisfaction.     Thompson Grayer

## 2018-11-16 NOTE — H&P (View-Only) (Signed)
ELECTROPHYSIOLOGY CONSULT NOTE  Patient ID: Stephanie Love MRN: [id number], DOB/AGE: Nov 07, 1935   Admit date: 11/14/2018 Date of Consult: 11/16/2018  Primary Physician: Marco Collie, MD Primary Cardiologist: none Reason for Consultation: Cryptogenic stroke ; recommendations regarding Implantable Loop Recorder, requested by Dr. Leonie Man  History of Present Illness Stephanie Love was initially evaluated at [facility] with visual changes, difficult speech, and ataxia, transferred and admitted on 11/14/2018 with stroke, and treated with TPA  PMHx includes COPD, HTN, hypothyroidism, GERD,   Neurology noted Stroke:  Embolic, unknown etiology, suspect small-vessel brainstem stroke or cerebellar stroke .  she has undergone workup for stroke including echocardiogram and carotid angio at [facility] with no reported large vessel occlusion.  The patient has been monitored on telemetry which has demonstrated sinus rhythm with no arrhythmias.     Echocardiogram this admission demonstrated IMPRESSIONS 1. The left ventricle has normal systolic function with an ejection fraction of 60-65%. The cavity size was normal. Left ventricular diastolic Doppler parameters are consistent with impaired relaxation. No evidence of left ventricular regional wall  motion abnormalities.  2. The right ventricle has normal systolic function. The cavity was normal. There is no increase in right ventricular wall thickness. Right ventricular systolic pressure is mildly elevated with an estimated pressure of 33.9 mmHg.  3. Tricuspid valve regurgitation is mild-moderate.  4. The aortic valve is tricuspid. Mild thickening of the aortic valve. No stenosis of the aortic valve.  FINDINGS  Left Ventricle: The left ventricle has normal systolic function, with an ejection fraction of 60-65%. The cavity size was normal. There is no increase in left ventricular wall thickness. Left ventricular diastolic Doppler parameters are  consistent with  impaired relaxation. Normal left ventricular filling pressures No evidence of left ventricular regional wall motion abnormalities..  Right Ventricle: The right ventricle has normal systolic function. The cavity was normal. There is no increase in right ventricular wall thickness. Right ventricular systolic pressure is mildly elevated with an estimated pressure of 33.9 mmHg.  Left Atrium: Left atrial size was normal in size.  Right Atrium: Right atrial size was normal in size. Right atrial pressure is estimated at 3 mmHg.  Interatrial Septum: No atrial level shunt detected by color flow Doppler.  Pericardium: There is no evidence of pericardial effusion.  Mitral Valve: The mitral valve is normal in structure. Mitral valve regurgitation is not visualized by color flow Doppler.  Tricuspid Valve: The tricuspid valve is normal in structure. Tricuspid valve regurgitation is mild-moderate by color flow Doppler.  Aortic Valve: The aortic valve is tricuspid Mild thickening of the aortic valve. Aortic valve regurgitation was not visualized by color flow Doppler. There is No stenosis of the aortic valve.  Pulmonic Valve: The pulmonic valve was grossly normal. Pulmonic valve regurgitation is not visualized by color flow Doppler.     Lab work is reviewed. ([place]) H/H 7.1/23.8 SARS-CoV-2 RNA negative    Prior to admission, the patient denies chest pain, shortness of breath, dizziness, palpitations, or syncope.  They are recovering from their stroke with plans to discharge.       Past Medical History:  Diagnosis Date  . Anemia   . Arthritis   . COPD (chronic obstructive pulmonary disease) ([place])   . Diverticulosis   . GERD (gastroesophageal reflux disease)   . Hypertension   . Hypothyroid   . NASH (nonalcoholic steatohepatitis)   . Osteoarthritis   . Stroke ([place]) 11/13/2018  . UTI (urinary tract infection)      Surgical  History:  Past Surgical  History:  Procedure Laterality Date  . ABDOMINAL HYSTERECTOMY  2012  . CHOLECYSTECTOMY  2013     Medications Prior to Admission  Medication Sig Dispense Refill Last Dose  . acetaminophen (TYLENOL) 325 MG tablet Take 325 mg by mouth every 6 (six) hours as needed for mild pain.    Past Month at Unknown time  . arformoterol (BROVANA) 15 MCG/2ML NEBU Take 15 mcg by nebulization every 12 (twelve) hours.   11/14/2018 at Unknown time  . cholecalciferol (VITAMIN D3) 25 MCG (1000 UT) tablet Take 1,000 Units by mouth daily.   11/14/2018 at Unknown time  . escitalopram (LEXAPRO) 10 MG tablet Take 10 mg by mouth daily.    11/14/2018 at Unknown time  . fluticasone (FLONASE) 50 MCG/ACT nasal spray Place 1-2 sprays into both nostrils daily as needed for allergies or rhinitis.   Past Month at Unknown time  . lansoprazole (PREVACID) 15 MG capsule Take 15 mg by mouth daily at 12 noon.   11/14/2018 at Unknown time  . levothyroxine (SYNTHROID, LEVOTHROID) 112 MCG tablet Take 112 mcg by mouth daily before breakfast.   11/14/2018 at Unknown time  . losartan (COZAAR) 25 MG tablet Take 25 mg by mouth daily.   11/14/2018 at Unknown time  . metoprolol succinate (TOPROL-XL) 25 MG 24 hr tablet Take 25 mg by mouth daily.   11/14/2018 at 0800  . roflumilast (DALIRESP) 500 MCG TABS tablet Take 500 mcg by mouth daily.    11/14/2018 at Unknown time  . Tiotropium Bromide Monohydrate (SPIRIVA RESPIMAT) 1.25 MCG/ACT AERS Inhale 2 puffs into the lungs daily.   11/14/2018 at Unknown time    Inpatient Medications:  . aspirin EC  325 mg Oral Daily  . Chlorhexidine Gluconate Cloth  6 each Topical Q0600  . levothyroxine  112 mcg Oral QAC breakfast  . mupirocin ointment  1 application Nasal BID  . pantoprazole (PROTONIX) IV  40 mg Intravenous QHS  . roflumilast  500 mcg Oral Daily  . senna  1 tablet Oral Daily  . umeclidinium bromide  1 puff Inhalation Daily    Allergies:  Allergies  Allergen Reactions  . Penicillins Anaphylaxis     Did it involve swelling of the face/tongue/throat, SOB, or low BP? yes Did it involve sudden or severe rash/hives, skin peeling, or any reaction on the inside of your mouth or nose? no Did you need to seek medical attention at a hospital or doctor's office? yes When did it last happen?pt was 20 If all above answers are "NO", may proceed with cephalosporin use.   . Codeine   . Vicodin [Hydrocodone-Acetaminophen]     Social History   Socioeconomic History  . Marital status: Widowed    Spouse name: Not on file  . Number of children: Not on file  . Years of education: Not on file  . Highest education level: Not on file  Occupational History  . Not on file  Social Needs  . Financial resource strain: Not very hard  . Food insecurity:    Worry: Never true    Inability: Never true  . Transportation needs:    Medical: No    Non-medical: No  Tobacco Use  . Smoking status: Never Smoker  . Smokeless tobacco: Never Used  Substance and Sexual Activity  . Alcohol use: Not Currently  . Drug use: Never  . Sexual activity: Not on file  Lifestyle  . Physical activity:    Days per week: 0  days    Minutes per session: 0 min  . Stress: Not at all  Relationships  . Social connections:    Talks on phone: Twice a week    Gets together: Twice a week    Attends religious service: 1 to 4 times per year    Active member of club or organization: Yes    Attends meetings of clubs or organizations: 1 to 4 times per year    Relationship status: Widowed  . Intimate partner violence:    Fear of current or ex partner: Patient refused    Emotionally abused: Patient refused    Physically abused: Patient refused    Forced sexual activity: Patient refused  Other Topics Concern  . Not on file  Social History Narrative  . Not on file     Family: Mother HTN, cancer,  Father DM, heart disease,    Review of Systems: All other systems reviewed and are otherwise negative except as noted above.   Physical Exam: Vitals:   11/15/18 1951 11/16/18 0002 11/16/18 0433 11/16/18 0802  BP: 130/76 140/70 129/70 128/69  Pulse: (!) 101 91 90 82  Resp: 14 10 11 20   Temp: 98.3 F (36.8 C) 98.9 F (37.2 C) 98.6 F (37 C) 98.6 F (37 C)  TempSrc: Oral Oral Oral Oral  SpO2: 96% 96% 95% 96%  Weight:      Height:        Limited exam given COVID19 pandemic GEN- The patient is elderly and frail appearing, alert and oriented x 3 today.   Head- normocephalic, atraumatic Eyes-  Sclera clear, conjunctiva pink Ears- hearing intact Oropharynx- clear Neck- supple Lungs-  normal work of breathing Heart- RRR GI- soft Extremities- no clubbing, cyanosis, or edema MS- no obvious significant deformity or atrophy Skin- no rash or lesion Psych- euthymic mood, full affect   Labs:  No results found for: WBC, HGB, HCT, MCV, PLT No results for input(s): NA, K, CL, CO2, BUN, CREATININE, CALCIUM, PROT, BILITOT, ALKPHOS, ALT, AST, GLUCOSE in the last 168 hours.  Invalid input(s): LABALBU No results found for: CKTOTAL, CKMB, CKMBINDEX, TROPONINI Lab Results  Component Value Date   CHOL 124 11/14/2018   Lab Results  Component Value Date   HDL 51 11/14/2018   Lab Results  Component Value Date   LDLCALC 65 11/14/2018   Lab Results  Component Value Date   TRIG 40 11/14/2018   Lab Results  Component Value Date   CHOLHDL 2.4 11/14/2018   No results found for: LDLDIRECT  No results found for: DDIMER   Radiology/Studies:   Mr Brain Wo Contrast Result Date: 11/14/2018 CLINICAL DATA:  Stroke follow-up. Dizziness. Dysconjugate gaze, dysarthria, and left-sided ataxia. Received IV tPA. EXAM: MRI HEAD WITHOUT CONTRAST TECHNIQUE: Multiplanar, multiecho pulse sequences of the brain and surrounding structures were obtained without intravenous contrast. COMPARISON:  None. FINDINGS: Brain: There is a 3 cm acute infarct superiorly in the left cerebellum. Punctate acute infarcts are present involving  bilateral parietal cortex and right frontal white matter. There is small volume subarachnoid hemorrhage in the right occipital and right parietal lobes, and there is also a small subdural hematoma over the posterior right cerebral convexity measuring up to 3 mm in thickness without mass effect. The ventricles are normal in size. Minimal chronic small vessel ischemic changes are present in the cerebral white matter, not greater than expected for age. Vascular: Major intracranial vascular flow voids are preserved. Skull and upper cervical spine: No suspicious marrow lesion.  Sinuses/Orbits: Clear paranasal sinuses. Moderate right mastoid effusion. Other: None. IMPRESSION: 1. 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 2. Punctate acute bilateral parietal and right frontal infarcts. 3. Small volume right parieto-occipital subarachnoid hemorrhage and small right-sided subdural hematoma without mass effect. Critical Value/emergent results were called by telephone at the time of interpretation on 11/14/2018 at 10:25 pm to Dr. Samara Snide , who verbally acknowledged these results. Electronically Signed   By: Logan Bores M.D.   On: 11/14/2018 22:25     Vas [place] Lower Extremity Venous (dvt) Result Date: 11/16/2018  Lower Venous Study Indications: Embolic stroke.  Performing Technologist: Oliver Hum RVT  Examination Guidelines: A complete evaluation includes B-mode imaging, spectral Doppler, color Doppler, and power Doppler as needed of all accessible portions of each vessel. Bilateral testing is considered an integral part of a complete examination. Limited examinations for reoccurring indications may be performed as noted.  Summary: Right: There is no evidence of deep vein thrombosis in the lower extremity. No cystic structure found in the popliteal fossa. Left: There is no evidence of deep vein thrombosis in the lower extremity. No cystic structure found in the popliteal fossa.  *See table(s) above  for measurements and observations. Electronically signed by Deitra Mayo MD on 11/16/2018 at 6:30:14 AM.    Final    12-lead ECG EKG from [facility] is SR None in EPIC   Telemetry sinus  Assessment and Plan:  1. Cryptogenic stroke The patient presents with cryptogenic stroke.  I spoke at length with the patient about monitoring for afib with an implantable loop recorder.  Risks, benefits, and alteratives to implantable loop recorder were discussed with the patient today.   At this time, the patient is very clear in their decision to proceed with implantable loop recorder.   Wound care was reviewed with the patient (keep incision clean and dry for 3 days).    Please call with questions.  Thompson Grayer MD, [facility] 11/16/2018 11:22 AM

## 2018-11-17 ENCOUNTER — Encounter (HOSPITAL_COMMUNITY): Payer: Self-pay | Admitting: Physical Medicine and Rehabilitation

## 2018-11-17 ENCOUNTER — Other Ambulatory Visit: Payer: Self-pay

## 2018-11-17 ENCOUNTER — Encounter (HOSPITAL_COMMUNITY): Payer: Self-pay | Admitting: *Deleted

## 2018-11-17 ENCOUNTER — Inpatient Hospital Stay (HOSPITAL_COMMUNITY)
Admission: RE | Admit: 2018-11-17 | Discharge: 2018-11-30 | DRG: 057 | Disposition: A | Discharge status: Home Health | Payer: Medicare Other | Source: Intra-hospital | Attending: Physical Medicine & Rehabilitation | Admitting: Physical Medicine & Rehabilitation

## 2018-11-17 DIAGNOSIS — I129 Hypertensive chronic kidney disease with stage 1 through stage 4 chronic kidney disease, or unspecified chronic kidney disease: Secondary | ICD-10-CM | POA: Diagnosis not present

## 2018-11-17 DIAGNOSIS — J441 Chronic obstructive pulmonary disease with (acute) exacerbation: Secondary | ICD-10-CM

## 2018-11-17 DIAGNOSIS — J449 Chronic obstructive pulmonary disease, unspecified: Secondary | ICD-10-CM

## 2018-11-17 DIAGNOSIS — R339 Retention of urine, unspecified: Secondary | ICD-10-CM | POA: Diagnosis present

## 2018-11-17 DIAGNOSIS — Z79899 Other long term (current) drug therapy: Secondary | ICD-10-CM

## 2018-11-17 DIAGNOSIS — Z87898 Personal history of other specified conditions: Secondary | ICD-10-CM | POA: Diagnosis not present

## 2018-11-17 DIAGNOSIS — K7581 Nonalcoholic steatohepatitis (NASH): Secondary | ICD-10-CM | POA: Diagnosis present

## 2018-11-17 DIAGNOSIS — H9193 Unspecified hearing loss, bilateral: Secondary | ICD-10-CM | POA: Diagnosis present

## 2018-11-17 DIAGNOSIS — K219 Gastro-esophageal reflux disease without esophagitis: Secondary | ICD-10-CM | POA: Diagnosis present

## 2018-11-17 DIAGNOSIS — Z90710 Acquired absence of both cervix and uterus: Secondary | ICD-10-CM | POA: Diagnosis not present

## 2018-11-17 DIAGNOSIS — Z9282 Status post administration of tPA (rtPA) in a different facility within the last 24 hours prior to admission to current facility: Secondary | ICD-10-CM

## 2018-11-17 DIAGNOSIS — I9589 Other hypotension: Secondary | ICD-10-CM | POA: Diagnosis not present

## 2018-11-17 DIAGNOSIS — I6622 Occlusion and stenosis of left posterior cerebral artery: Secondary | ICD-10-CM | POA: Diagnosis not present

## 2018-11-17 DIAGNOSIS — Z79890 Hormone replacement therapy (postmenopausal): Secondary | ICD-10-CM | POA: Diagnosis not present

## 2018-11-17 DIAGNOSIS — E039 Hypothyroidism, unspecified: Secondary | ICD-10-CM

## 2018-11-17 DIAGNOSIS — R7303 Prediabetes: Secondary | ICD-10-CM | POA: Diagnosis present

## 2018-11-17 DIAGNOSIS — E441 Mild protein-calorie malnutrition: Secondary | ICD-10-CM | POA: Diagnosis present

## 2018-11-17 DIAGNOSIS — I959 Hypotension, unspecified: Secondary | ICD-10-CM | POA: Diagnosis present

## 2018-11-17 DIAGNOSIS — Z87891 Personal history of nicotine dependence: Secondary | ICD-10-CM

## 2018-11-17 DIAGNOSIS — M1990 Unspecified osteoarthritis, unspecified site: Secondary | ICD-10-CM | POA: Diagnosis present

## 2018-11-17 DIAGNOSIS — Z6824 Body mass index (BMI) 24.0-24.9, adult: Secondary | ICD-10-CM

## 2018-11-17 DIAGNOSIS — R270 Ataxia, unspecified: Secondary | ICD-10-CM

## 2018-11-17 DIAGNOSIS — Z833 Family history of diabetes mellitus: Secondary | ICD-10-CM

## 2018-11-17 DIAGNOSIS — Z7982 Long term (current) use of aspirin: Secondary | ICD-10-CM

## 2018-11-17 DIAGNOSIS — K5901 Slow transit constipation: Secondary | ICD-10-CM | POA: Diagnosis not present

## 2018-11-17 DIAGNOSIS — I69298 Other sequelae of other nontraumatic intracranial hemorrhage: Secondary | ICD-10-CM | POA: Diagnosis not present

## 2018-11-17 DIAGNOSIS — I10 Essential (primary) hypertension: Secondary | ICD-10-CM | POA: Diagnosis present

## 2018-11-17 DIAGNOSIS — Z9049 Acquired absence of other specified parts of digestive tract: Secondary | ICD-10-CM | POA: Diagnosis not present

## 2018-11-17 DIAGNOSIS — D638 Anemia in other chronic diseases classified elsewhere: Secondary | ICD-10-CM | POA: Diagnosis not present

## 2018-11-17 DIAGNOSIS — I69393 Ataxia following cerebral infarction: Secondary | ICD-10-CM | POA: Diagnosis not present

## 2018-11-17 DIAGNOSIS — E785 Hyperlipidemia, unspecified: Secondary | ICD-10-CM

## 2018-11-17 DIAGNOSIS — M25511 Pain in right shoulder: Secondary | ICD-10-CM | POA: Diagnosis present

## 2018-11-17 DIAGNOSIS — I639 Cerebral infarction, unspecified: Secondary | ICD-10-CM

## 2018-11-17 DIAGNOSIS — I69398 Other sequelae of cerebral infarction: Secondary | ICD-10-CM | POA: Diagnosis not present

## 2018-11-17 DIAGNOSIS — D509 Iron deficiency anemia, unspecified: Secondary | ICD-10-CM | POA: Diagnosis present

## 2018-11-17 DIAGNOSIS — M25512 Pain in left shoulder: Secondary | ICD-10-CM | POA: Diagnosis present

## 2018-11-17 DIAGNOSIS — Z89421 Acquired absence of other right toe(s): Secondary | ICD-10-CM | POA: Diagnosis not present

## 2018-11-17 DIAGNOSIS — R0989 Other specified symptoms and signs involving the circulatory and respiratory systems: Secondary | ICD-10-CM

## 2018-11-17 DIAGNOSIS — I69090 Apraxia following nontraumatic subarachnoid hemorrhage: Secondary | ICD-10-CM

## 2018-11-17 DIAGNOSIS — E8809 Other disorders of plasma-protein metabolism, not elsewhere classified: Secondary | ICD-10-CM | POA: Diagnosis present

## 2018-11-17 DIAGNOSIS — Z885 Allergy status to narcotic agent status: Secondary | ICD-10-CM | POA: Diagnosis not present

## 2018-11-17 DIAGNOSIS — I63119 Cerebral infarction due to embolism of unspecified vertebral artery: Secondary | ICD-10-CM | POA: Diagnosis not present

## 2018-11-17 DIAGNOSIS — E46 Unspecified protein-calorie malnutrition: Secondary | ICD-10-CM

## 2018-11-17 DIAGNOSIS — I6340 Cerebral infarction due to embolism of unspecified cerebral artery: Secondary | ICD-10-CM | POA: Diagnosis not present

## 2018-11-17 DIAGNOSIS — K30 Functional dyspepsia: Secondary | ICD-10-CM | POA: Diagnosis not present

## 2018-11-17 DIAGNOSIS — Z880 Allergy status to penicillin: Secondary | ICD-10-CM | POA: Diagnosis not present

## 2018-11-17 DIAGNOSIS — R1110 Vomiting, unspecified: Secondary | ICD-10-CM

## 2018-11-17 DIAGNOSIS — N182 Chronic kidney disease, stage 2 (mild): Secondary | ICD-10-CM | POA: Diagnosis not present

## 2018-11-17 DIAGNOSIS — Z8249 Family history of ischemic heart disease and other diseases of the circulatory system: Secondary | ICD-10-CM

## 2018-11-17 HISTORY — DX: Cerebral infarction, unspecified: I63.9

## 2018-11-17 LAB — CBC WITH DIFFERENTIAL/PLATELET
Abs Immature Granulocytes: 0.02 K/uL (ref 0.00–0.07)
Basophils Absolute: 0 K/uL (ref 0.0–0.1)
Basophils Relative: 1 %
Eosinophils Absolute: 0.1 K/uL (ref 0.0–0.5)
Eosinophils Relative: 2 %
HCT: 24.7 % — ABNORMAL LOW (ref 36.0–46.0)
Hemoglobin: 7 g/dL — ABNORMAL LOW (ref 12.0–15.0)
Immature Granulocytes: 0 %
Lymphocytes Relative: 22 %
Lymphs Abs: 1.3 K/uL (ref 0.7–4.0)
MCH: 19 pg — ABNORMAL LOW (ref 26.0–34.0)
MCHC: 28.3 g/dL — ABNORMAL LOW (ref 30.0–36.0)
MCV: 67.1 fL — ABNORMAL LOW (ref 80.0–100.0)
Monocytes Absolute: 0.7 K/uL (ref 0.1–1.0)
Monocytes Relative: 11 %
Neutro Abs: 3.7 K/uL (ref 1.7–7.7)
Neutrophils Relative %: 64 %
Platelets: 213 K/uL (ref 150–400)
RBC: 3.68 MIL/uL — ABNORMAL LOW (ref 3.87–5.11)
RDW: 16.3 % — ABNORMAL HIGH (ref 11.5–15.5)
WBC: 5.8 K/uL (ref 4.0–10.5)
nRBC: 0 % (ref 0.0–0.2)

## 2018-11-17 LAB — COMPREHENSIVE METABOLIC PANEL WITH GFR
ALT: 19 U/L (ref 0–44)
AST: 24 U/L (ref 15–41)
Albumin: 2.7 g/dL — ABNORMAL LOW (ref 3.5–5.0)
Alkaline Phosphatase: 75 U/L (ref 38–126)
Anion gap: 8 (ref 5–15)
BUN: 17 mg/dL (ref 8–23)
CO2: 22 mmol/L (ref 22–32)
Calcium: 9.2 mg/dL (ref 8.9–10.3)
Chloride: 105 mmol/L (ref 98–111)
Creatinine, Ser: 0.88 mg/dL (ref 0.44–1.00)
GFR calc Af Amer: >60 mL/min
GFR calc non Af Amer: >60 mL/min
Glucose, Bld: 162 mg/dL — ABNORMAL HIGH (ref 70–99)
Potassium: 3.5 mmol/L (ref 3.5–5.1)
Sodium: 135 mmol/L (ref 135–145)
Total Bilirubin: 0.5 mg/dL (ref 0.3–1.2)
Total Protein: 5.8 g/dL — ABNORMAL LOW (ref 6.5–8.1)

## 2018-11-17 MED ORDER — TRAZODONE HCL 50 MG PO TABS
25.0000 mg | ORAL_TABLET | Freq: Every evening | ORAL | Status: DC | PRN
  Administered 2018-11-20 – 2018-11-29 (×3): 50 mg via ORAL
  Filled 2018-11-17 (×4): qty 1

## 2018-11-17 MED ORDER — UMECLIDINIUM BROMIDE 62.5 MCG/INH IN AEPB
1.0000 | INHALATION_SPRAY | Freq: Every day | RESPIRATORY_TRACT | Status: DC
  Administered 2018-11-18 – 2018-11-30 (×12): 1 via RESPIRATORY_TRACT
  Filled 2018-11-17 (×2): qty 7

## 2018-11-17 MED ORDER — FLEET ENEMA 7-19 GM/118ML RE ENEM: 1.0000 | ENEMA | Freq: Once | RECTAL | Status: DC | PRN

## 2018-11-17 MED ORDER — ALUM & MAG HYDROXIDE-SIMETH 200-200-20 MG/5ML PO SUSP
30.0000 mL | ORAL | Status: DC | PRN
  Administered 2018-11-18 – 2018-11-27 (×4): 30 mL via ORAL
  Filled 2018-11-17 (×5): qty 30

## 2018-11-17 MED ORDER — TROLAMINE SALICYLATE 10 % EX CREA: TOPICAL_CREAM | Freq: Three times a day (TID) | CUTANEOUS | Status: DC

## 2018-11-17 MED ORDER — MUPIROCIN 2 % EX OINT
1.0000 "application " | TOPICAL_OINTMENT | Freq: Two times a day (BID) | CUTANEOUS | Status: AC
  Administered 2018-11-17 – 2018-11-18 (×3): 1 via NASAL

## 2018-11-17 MED ORDER — ALBUTEROL SULFATE (2.5 MG/3ML) 0.083% IN NEBU: 2.5000 mg | INHALATION_SOLUTION | Freq: Four times a day (QID) | RESPIRATORY_TRACT | Status: DC | PRN

## 2018-11-17 MED ORDER — ALBUTEROL SULFATE (2.5 MG/3ML) 0.083% IN NEBU: 2.5000 mg | INHALATION_SOLUTION | Freq: Four times a day (QID) | RESPIRATORY_TRACT | 12 refills | Status: DC | PRN

## 2018-11-17 MED ORDER — ACETAMINOPHEN 325 MG PO TABS: 325.0000 mg | ORAL_TABLET | ORAL | Status: DC | PRN

## 2018-11-17 MED ORDER — ACETAMINOPHEN 325 MG PO TABS: 650.0000 mg | ORAL_TABLET | ORAL | Status: DC | PRN

## 2018-11-17 MED ORDER — ASPIRIN 325 MG PO TBEC: 325.0000 mg | DELAYED_RELEASE_TABLET | Freq: Every day | ORAL | 0 refills | Status: DC

## 2018-11-17 MED ORDER — ROFLUMILAST 500 MCG PO TABS
500.0000 ug | ORAL_TABLET | Freq: Every day | ORAL | Status: DC
  Administered 2018-11-18 – 2018-11-30 (×13): 500 ug via ORAL
  Filled 2018-11-17 (×13): qty 1

## 2018-11-17 MED ORDER — FLUTICASONE PROPIONATE 50 MCG/ACT NA SUSP: 1.0000 | Freq: Every day | NASAL | Status: DC | PRN

## 2018-11-17 MED ORDER — ASPIRIN EC 325 MG PO TBEC
325.0000 mg | DELAYED_RELEASE_TABLET | Freq: Every day | ORAL | Status: DC
  Administered 2018-11-18 – 2018-11-30 (×13): 325 mg via ORAL
  Filled 2018-11-17 (×13): qty 1

## 2018-11-17 MED ORDER — SENNOSIDES-DOCUSATE SODIUM 8.6-50 MG PO TABS: 1.0000 | ORAL_TABLET | Freq: Every evening | ORAL | Status: DC | PRN

## 2018-11-17 MED ORDER — METOPROLOL SUCCINATE ER 25 MG PO TB24
25.0000 mg | ORAL_TABLET | Freq: Every day | ORAL | Status: DC
  Administered 2018-11-17 – 2018-11-30 (×14): 25 mg via ORAL
  Filled 2018-11-17 (×13): qty 1

## 2018-11-17 MED ORDER — ENOXAPARIN SODIUM 40 MG/0.4ML ~~LOC~~ SOLN
40.0000 mg | SUBCUTANEOUS | Status: DC
  Administered 2018-11-17 – 2018-11-29 (×13): 40 mg via SUBCUTANEOUS
  Filled 2018-11-17 (×13): qty 0.4

## 2018-11-17 MED ORDER — PROCHLORPERAZINE MALEATE 5 MG PO TABS
5.0000 mg | ORAL_TABLET | Freq: Four times a day (QID) | ORAL | Status: DC | PRN
  Administered 2018-11-19 – 2018-11-24 (×4): 10 mg via ORAL
  Administered 2018-11-25: 5 mg via ORAL
  Administered 2018-11-26 – 2018-11-30 (×5): 10 mg via ORAL
  Filled 2018-11-17: qty 1
  Filled 2018-11-17 (×9): qty 2

## 2018-11-17 MED ORDER — MUSCLE RUB 10-15 % EX CREA
TOPICAL_CREAM | Freq: Three times a day (TID) | CUTANEOUS | Status: DC
  Administered 2018-11-17 – 2018-11-30 (×23): via TOPICAL
  Filled 2018-11-17: qty 85

## 2018-11-17 MED ORDER — SENNA 8.6 MG PO TABS: 1.0000 | ORAL_TABLET | Freq: Every day | ORAL | 0 refills | Status: DC

## 2018-11-17 MED ORDER — PROCHLORPERAZINE 25 MG RE SUPP: 12.5000 mg | Freq: Four times a day (QID) | RECTAL | Status: DC | PRN

## 2018-11-17 MED ORDER — GUAIFENESIN-DM 100-10 MG/5ML PO SYRP: 5.0000 mL | ORAL_SOLUTION | Freq: Four times a day (QID) | ORAL | Status: DC | PRN

## 2018-11-17 MED ORDER — LEVOTHYROXINE SODIUM 112 MCG PO TABS
112.0000 ug | ORAL_TABLET | Freq: Every day | ORAL | Status: DC
  Administered 2018-11-18 – 2018-11-30 (×13): 112 ug via ORAL
  Filled 2018-11-17 (×13): qty 1

## 2018-11-17 MED ORDER — PROCHLORPERAZINE EDISYLATE 10 MG/2ML IJ SOLN: 5.0000 mg | Freq: Four times a day (QID) | INTRAMUSCULAR | Status: DC | PRN

## 2018-11-17 MED ORDER — POLYETHYLENE GLYCOL 3350 17 G PO PACK
17.0000 g | PACK | Freq: Every day | ORAL | Status: DC | PRN
  Administered 2018-11-21 – 2018-11-22 (×2): 17 g via ORAL
  Filled 2018-11-17 (×2): qty 1

## 2018-11-17 MED ORDER — DIPHENHYDRAMINE HCL 12.5 MG/5ML PO ELIX: 12.5000 mg | ORAL_SOLUTION | Freq: Four times a day (QID) | ORAL | Status: DC | PRN

## 2018-11-17 MED ORDER — SENNOSIDES-DOCUSATE SODIUM 8.6-50 MG PO TABS
1.0000 | ORAL_TABLET | Freq: Every day | ORAL | Status: DC
  Administered 2018-11-17 – 2018-11-27 (×8): 1 via ORAL
  Filled 2018-11-17 (×10): qty 1

## 2018-11-17 MED ORDER — BISACODYL 10 MG RE SUPP
10.0000 mg | Freq: Every day | RECTAL | Status: DC | PRN
  Administered 2018-11-18: 10 mg via RECTAL
  Filled 2018-11-17: qty 1

## 2018-11-17 MED ORDER — CHLORHEXIDINE GLUCONATE CLOTH 2 % EX PADS
6.0000 | MEDICATED_PAD | Freq: Every day | CUTANEOUS | Status: DC
  Administered 2018-11-21: 6 via TOPICAL

## 2018-11-17 MED ORDER — FLUTICASONE PROPIONATE 50 MCG/ACT NA SUSP
1.0000 | Freq: Every day | NASAL | Status: DC | PRN
  Filled 2018-11-17: qty 16

## 2018-11-17 NOTE — Discharge Summary (Addendum)
Physician Discharge Summary  Patient ID: Stephanie Love MRN: [id number] DOB/AGE: 01-23-1936 83 y.o.  Admit date: 11/14/2018 Discharge date: 11/17/2018  Admission Diagnoses:  Embolic bicerebral strokes, cryptogenic etiology s/p iv tpa at outside hospital Discharge Diagnoses:  Active Problems:   Ischemic stroke [facility])   Discharged Condition: stable  Hospital Course: Stephanie Love a 82 y.o.femalepast medical history of COPD, hypertension, hypothyroidism,GERDresents to the emergency room at [facility] with sudden onset dizziness that began around 6:30 PM.She had nausea and vomited multiple times. She went to [facility] and was evaluated by tele neurology. On assessment,noted to have disconjugate gaze, dysarthria and left-sided ataxia. Risk versus benefits was discussed with patient and she is received IV TPA (start time was 9.22pm). Herblood pressure remained below 397 systolic per reports.She was transferred to [facility] for further management. Stroke work up completed, inc Loop monitor placed for long term ongoing cardiac monitoring in setting of cryptogenic stroke. She is now ready for rehab efforts. A/P: Stroke:  Embolic bicerebral, cryptogenic etiology  Resultant  Nausea,vomiting, left-sided ataxia suspect small-vessel brainstem stroke or cerebellar stroke  CT head/ CTA H&N - OSH - no acute findings, No large vessel occlusion.  5/26 Loop placed. (d/t COVID, no TEE)  MRI head - Acute left superior cerebellar artery territory infarct, acute bilateral parietal and right frontal infarcts, Small volume right parieto-occipital subarachnoid hemorrhage and small right-sided subdural hematoma without mass effect  MRA head - not performed  CTA H&N - OSH - no LVO  Carotid Doppler - CTA neck performed - carotid dopplers not indicated.  2D Echo -normal ejection fraction 60 to 65%.  [employer] Virus 2 - OSH - negative  LDL - 65  HgbA1c - 5.9  UDS - not  performed  VTE prophylaxis - SCDs  Diet  - Heart healthy with thin liquids.  No antithrombotic prior to admission, now S/P tPA. ASA 325mg    Patient counseled to be compliant with her antithrombotic medications  Ongoing aggressive stroke risk factor management  Therapy recommendations:  CLR  Disposition:  Pending  Hypertension  Stable  Permissive hypertension (OK if < 220/120) but gradually normalize in 5-7 days  Long-term BP goal normotensive  Hyperlipidemia  Lipid lowering medication PTA: none  LDL 65, goal < 70  Current lipid lowering medication none due to liver disease  Other Stroke Risk Factors  Advanced age  Other Active Problems  COPD  Consults: cardiology  Significant Diagnostic Studies: CT, CTA, Echo, MR  Treatments: Loop recorder insertion  Discharge Exam: Blood pressure 132/66, pulse 86, temperature 99.3 F (37.4 C), temperature source Oral, resp. rate 20, height 5\' 2"  (1.575 m), weight 59 kg, SpO2 97 %. Pleasant elderly lady not in distress. Afebrile. Head is nontraumatic. Neck is supple without bruit. Cardiac exam no murmur or gallop. Lungs are clear to auscultation. Distal pulses are well felt.  Neurological Exam: Awake  Alert oriented x 3. Normal speech and language. Perrl, EOMI. VFF without nystagmus. Hearing is normal. Palatal movements are normal. Face symmetric. Tongue midline. Normal strength, tone, reflexes . Mildly impaired left finger-to-nose coordination. Normal sensation. Gait deferred.  Disposition:  Stable and ready for d/c to inpt rehab for stroke rehab efforts. D/c time spent today is 80min  Allergies as of 11/17/2018      Reactions   Penicillins Anaphylaxis   Did it involve swelling of the face/tongue/throat, SOB, or low BP? yes Did it involve sudden or severe rash/hives, skin peeling, or any reaction on the inside of your mouth  or nose? no Did you need to seek medical attention at a hospital or doctor's office?  yes When did it last happen?pt was 20 If all above answers are "NO", may proceed with cephalosporin use.   Codeine    Vicodin [hydrocodone-acetaminophen]       Medication List    STOP taking these medications   albuterol 108 (90 Base) MCG/ACT inhaler Commonly known as:  VENTOLIN HFA Replaced by:  albuterol (2.5 MG/3ML) 0.083% nebulizer solution     TAKE these medications   acetaminophen 325 MG tablet Commonly known as:  TYLENOL Take 2 tablets (650 mg total) by mouth every 4 (four) hours as needed for mild pain (or temp > 37.5 C (99.5 F)). What changed:    how much to take  when to take this  reasons to take this   albuterol (2.5 MG/3ML) 0.083% nebulizer solution Commonly known as:  PROVENTIL Inhale 3 mLs (2.5 mg total) into the lungs every 6 (six) hours as needed for wheezing or shortness of breath. Replaces:  albuterol 108 (90 Base) MCG/ACT inhaler   arformoterol 15 MCG/2ML Nebu Commonly known as:  BROVANA Take 15 mcg by nebulization every 12 (twelve) hours.   aspirin 325 MG EC tablet Take 1 tablet (325 mg total) by mouth daily. Start taking on:  Nov 18, 2018   cholecalciferol 25 MCG (1000 UT) tablet Commonly known as:  VITAMIN D3 Take 1,000 Units by mouth daily.   Daliresp 500 MCG Tabs tablet Generic drug:  roflumilast Take 500 mcg by mouth daily.   escitalopram 10 MG tablet Commonly known as:  LEXAPRO Take 10 mg by mouth daily.   fluticasone 50 MCG/ACT nasal spray Commonly known as:  FLONASE Place 1-2 sprays into both nostrils daily as needed for allergies or rhinitis.   lansoprazole 15 MG capsule Commonly known as:  PREVACID Take 15 mg by mouth daily at 12 noon.   levothyroxine 112 MCG tablet Commonly known as:  SYNTHROID Take 112 mcg by mouth daily before breakfast.   losartan 25 MG tablet Commonly known as:  COZAAR Take 25 mg by mouth daily.   metoprolol succinate 25 MG 24 hr tablet Commonly known as:  TOPROL-XL Take 25 mg by mouth  daily.   senna 8.6 MG Tabs tablet Commonly known as:  SENOKOT Take 1 tablet (8.6 mg total) by mouth daily. Start taking on:  Nov 18, 2018   senna-docusate 8.6-50 MG tablet Commonly known as:  Senokot-S Take 1 tablet by mouth at bedtime as needed for mild constipation.   Spiriva Respimat 1.25 MCG/ACT Aers Generic drug:  Tiotropium Bromide Monohydrate Inhale 2 puffs into the lungs daily.      Follow-up Information    [place] Office Follow up.   Specialty:  Cardiology Why:  11/25/2018 @ 2;30PM, wound check visit Contact information: [address] [place] [phone]       [facility]. Schedule an appointment as soon as possible for a visit in 6 week(s).   Specialty:  Neurology Why:  f/u with Dr Leonie Man in 6 wks  Contact information: [place] [phone]        Time spent on discharge summary 35 minutes  Signed: [place] 11/17/2018, 12:15 PM I have personally obtained history,examined this patient, reviewed notes, independently viewed imaging studies, participated in medical decision making and plan of care.ROS completed by me personally and pertinent positives fully documented  I have made any additions or clarifications directly to the above note. Agree with note above.    Antony Contras, MD Medical Director [place] Pager: [phone] 11/17/2018 5:54 PM

## 2018-11-17 NOTE — Progress Notes (Addendum)
STROKE TEAM PROGRESS NOTE    SUBJECTIVE (INTERVAL HISTORY) Patient is well.  She has no complaints. She had loop recorder inserted and is stable for d/c to inpt rehab today.     OBJECTIVE Vitals:   11/16/18 2349 11/17/18 0323 11/17/18 0746 11/17/18 1140  BP: 111/64 (!) 116/45 (!) 147/72 132/66  Pulse: (!) 101 88 89 86  Resp: 14 12 16 20   Temp: 99.6 F (37.6 C) 99.3 F (37.4 C) 98.9 F (37.2 C) 99.3 F (37.4 C)  TempSrc: Oral Oral Oral Oral  SpO2: 97% 97% 98% 97%  Weight:      Height:        CBC: No results for input(s): WBC, NEUTROABS, HGB, HCT, MCV, PLT in the last 168 hours.  Basic Metabolic Panel: No results for input(s): NA, K, CL, CO2, GLUCOSE, BUN, CREATININE, CALCIUM, MG, PHOS in the last 168 hours.  Lipid Panel:     Component Value Date/Time   CHOL 124 11/14/2018 0434   TRIG 40 11/14/2018 0434   HDL 51 11/14/2018 0434   CHOLHDL 2.4 11/14/2018 0434   VLDL 8 11/14/2018 0434   LDLCALC 65 11/14/2018 0434   HgbA1c:  Lab Results  Component Value Date   HGBA1C 5.9 (H) 11/14/2018   Urine Drug Screen: No results found for: LABOPIA, COCAINSCRNUR, LABBENZ, AMPHETMU, THCU, LABBARB  Alcohol Level No results found for: ETH  IMAGING  CT angiogram at OSH - no LVO.   MRI Brain Wo Contrast - IMPRESSION: 1. 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 2. Punctate acute bilateral parietal and right frontal infarcts. 3. Small volume right parieto-occipital subarachnoid hemorrhage and small right-sided subdural hematoma without mass effect  CT Head Wo Contrast - Completed at OSH, no acute findings.    Transthoracic Echocardiogram  LV wnl, EF 60%, Aortic vavlve is tricuspid.    EKG - not performed here  MRI of the brain 11/14/2018: IMPRESSION: 1. 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 2. Punctate acute bilateral parietal and right frontal infarcts. 3. Small volume right parieto-occipital subarachnoid hemorrhage and small right-sided  subdural hematoma without mass effect  PHYSICAL EXAM Blood pressure 132/66, pulse 86, temperature 99.3 F (37.4 C), temperature source Oral, resp. rate 20, height 5\' 2"  (1.575 m), weight 59 kg, SpO2 97 %. Pleasant elderly lady not in distress. Afebrile. Head is nontraumatic. Neck is supple without bruit. Cardiac exam no murmur or gallop. Lungs are clear to auscultation. Distal pulses are well felt.  Neurological Exam: Awake  Alert oriented x 3. Normal speech and language. Perrl, EOMI. VFF without nystagmus. Hearing is normal. Palatal movements are normal. Face symmetric. Tongue midline. Normal strength, tone, reflexes . Mildly impaired left finger-to-nose coordination. Normal sensation. Gait deferred.  ASSESSMENT/PLAN Ms. Ahuva Poynor is a 83 y.o. female with history of COPD, hypertension, anemia, hypothyroidism,GERD presenting with dizziness, disconjugate gaze, dysarthria and left-sided ataxia.  She received IV tPA at [facility] on 11/14/18 @ 9:22 PM   Stroke:  Embolic, unknown etiology  Resultant  Nausea,vomiting, left-sided ataxia suspect small-vessel brainstem stroke or cerebellar stroke  CT head/ CTA H&N - OSH - no acute findings, No large vessel occlusion.  5/26 Loop placed. 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  2D Echo -normal ejection fraction 60 to 65%.  [employer] Virus 2 - OSH - negative  LDL - 65  HgbA1c - 5.9  UDS - not performed  VTE prophylaxis - SCDs  Diet  - Heart healthy with thin liquids.  No antithrombotic prior to admission, now S/P tPA. ASA 325mg    Patient counseled to be compliant with her  antithrombotic medications  Ongoing aggressive stroke risk factor management  Therapy recommendations:  CLR  Disposition:  Pending  Hypertension  Stable . Permissive hypertension (OK if < 220/120) but gradually normalize in 5-7 days . Long-term BP goal normotensive  Hyperlipidemia  Lipid lowering medication PTA: none  LDL 65, goal < 70  Current lipid lowering medication none due to liver disease  Other Stroke Risk Factors  Advanced age  Other Active Problems  COPD  Desiree Metzger-CIhelka, ARNP-C, ANVP-BC I have personally obtained history,examined this patient, reviewed notes, independently viewed imaging studies, participated in medical decision making and plan of care.ROS completed by me personally and pertinent positives fully documented  I have made any additions or clarifications directly to the above note. Agree with note above.    Antony Contras, MD Medical Director [place] Pager: [phone] 11/17/2018 5:51 PM  To contact Stroke Continuity provider, please refer to [email or web address] After hours, contact General Neurology

## 2018-11-17 NOTE — Progress Notes (Signed)
Report given to [facility], patient going to 5n-17 at this time

## 2018-11-17 NOTE — Progress Notes (Signed)
Jamse Arn, MD  Physician  Physical Medicine and Rehabilitation  PMR Pre-admission  Signed  Date of Service:  11/17/2018 11:24 AM       Related encounter: Admission (Discharged) from 11/14/2018 in [facility]      Signed         Show:Clear all '[x]' Manual'[x]' Template'[]' Copied  Added by: '[x]' Cristina Gong, RN'[x]' Jamse Arn, MD  '[]' Hover for details PMR Admission Coordinator Pre-Admission Assessment  Patient: Stephanie Love is an 83 y.o., female MRN: [id number] DOB: Feb 01, 1936 Height: '5\' 2"'  (157.5 cm)Weight: 59 kg  Insurance Information HMO:     PPO:      PCP:      IPA:      80/20:      OTHER: no HMO PRIMARY: Medicare a and b      Policy#: [id number]      Subscriber: pt Benefits:  Phone #: passport one online     Name: 5/27 Eff. Date: 09/22/2002     Deduct: $1408      Out of Pocket Max: none      Life Max: none CIR: 100%      SNF: 20 full days Outpatient:  80%     Co-Pay: 20% Home Health: 100%      Co-Pay: none DME: 80%     Co-Pay: 20% Providers: pt choice  SECONDARY: [employer]      Policy#: [id number]      Subscriber: pt  Medicaid Application Date:       Case Manager:  Disability Application Date:       Case Worker:   The "Data Collection Information Summary" for patients in Inpatient Rehabilitation Facilities with attached "Privacy Act [place] Records" was provided and verbally reviewed with: Patient  Emergency Contact Information         Contact Information    Name Relation Home Work [place] Daughter   [phone]      Current Medical History  Patient Admitting Diagnosis: CVA  History of Present Illness: 83 year old with past medical history of COPD, HTN, hypothyroidism, GERD presented to [facility] ER on 5/24 with sudden onset of dizziness. Had nausea and vomiting. Noted to have disconjugate gaze, dysarthria and left sided ataxia. Patient given IV TPA and transferred to [facility] hospital.   CT head at [facility] showed no acute findings. CT angio no LVO. COVID test negative. 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  TEE cancelled by Dr. Leonie Man. LOOP Placed 5/26. CTA neck performed. No significance. 2D echo normal EF 60 to 65%. LDL 65. VTE prophylaxis SCD's.  Patient on no antithrombotic pta, placed on Asa 325 mg 24 hrs after TPA.  No lipid lowering med due to liver disease.  Bilateral LE dopplers negative for DVTs.   Complete NIHSS TOTAL: 0  Patient's medical record from [facility] has been reviewed by the rehabilitation admission coordinator and physician.  Past Medical History      Past Medical History:  Diagnosis Date  . Anemia   . Arthritis   . COPD (chronic obstructive pulmonary disease) ([place])   . Diverticulosis   . GERD (gastroesophageal reflux disease)   . Hypertension   . Hypothyroid   . NASH (nonalcoholic steatohepatitis)   . Osteoarthritis   . Stroke ([place]) 11/13/2018  . UTI (urinary tract infection)     Family History  family history is not on file.  Prior Rehab/Hospitalizations Has the patient had prior rehab or hospitalizations prior to admission? Yes  Has the patient had major surgery during 100 days prior to admission? No              Current Medications  Current Facility-Administered Medications:  .  acetaminophen (TYLENOL) tablet 650 mg, 650 mg, Oral, Q4H PRN, 650 mg at 11/16/18 2140 **OR** acetaminophen (TYLENOL) solution 650 mg, 650 mg, Per Tube, Q4H PRN **OR** acetaminophen (TYLENOL) suppository 650 mg, 650 mg, Rectal, Q4H PRN, Allred, James, MD .  albuterol (PROVENTIL) (2.5 MG/3ML) 0.083% nebulizer solution 2.5 mg, 2.5 mg, Inhalation, Q6H PRN, Allred, James, MD .  aspirin EC tablet 325 mg, 325 mg, Oral, Daily, Allred, James, MD, 325 mg at 11/17/18 0827 .   Chlorhexidine Gluconate Cloth 2 % PADS 6 each, 6 each, Topical, Q0600, Thompson Grayer, MD, 6 each at 11/17/18 [phone] .  fluticasone (FLONASE) 50 MCG/ACT nasal spray 1-2 spray, 1-2 spray, Each Nare, Daily PRN, Allred, James, MD .  labetalol (NORMODYNE) injection 5 mg, 5 mg, Intravenous, Q10 min PRN, Allred, James, MD .  levothyroxine (SYNTHROID) tablet 112 mcg, 112 mcg, Oral, QAC breakfast, Allred, Jeneen Rinks, MD, 112 mcg at 11/17/18 [phone] .  mupirocin ointment (BACTROBAN) 2 % 1 application, 1 application, Nasal, BID, Allred, Jeneen Rinks, MD, 1 application at 13/24/40 [phone] .  ondansetron (ZOFRAN) 8 mg in sodium chloride 0.9 % 50 mL IVPB, 8 mg, Intravenous, Q8H PRN, Allred, James, MD .  pantoprazole (PROTONIX) injection 40 mg, 40 mg, Intravenous, QHS, Allred, James, MD, 40 mg at 11/16/18 2130 .  prochlorperazine (COMPAZINE) injection 10 mg, 10 mg, Intravenous, Q6H PRN, Allred, James, MD, 10 mg at 11/14/18 1709 .  roflumilast (DALIRESP) tablet 500 mcg, 500 mcg, Oral, Daily, Allred, James, MD, 500 mcg at 11/17/18 0826 .  senna (SENOKOT) tablet 8.6 mg, 1 tablet, Oral, Daily, Allred, James, MD, 8.6 mg at 11/17/18 0827 .  senna-docusate (Senokot-S) tablet 1 tablet, 1 tablet, Oral, QHS PRN, Allred, James, MD .  umeclidinium bromide (INCRUSE ELLIPTA) 62.5 MCG/INH 1 puff, 1 puff, Inhalation, Daily, Allred, James, MD, 1 puff at 11/17/18 0827  Patients Current Diet:     Diet Order                  Diet Heart Room service appropriate? Yes; Fluid consistency: Thin  Diet effective now               Precautions / Restrictions Precautions Precautions: Fall Precaution Comments: ataxia Restrictions Weight Bearing Restrictions: No   Has the patient had 2 or more falls or a fall with injury in the past year? No  Prior Activity Level Community (5-7x/wk): active, caregiver for 15 month old great grand daughter  Prior Functional Level Self Care: Did the patient need help bathing, dressing, using the toilet  or eating? Independent  Indoor Mobility: Did the patient need assistance with walking from room to room (with or without device)? Independent  Stairs: Did the patient need assistance with internal or external stairs (with or without device)? Independent  Functional Cognition: Did the patient need help planning regular tasks such as shopping or remembering to take medications? [place] / [place] Devices/Equipment: Cane (specify quad or straight), Walker (specify type) Home Equipment: Walker - 2 wheels, Cane - single point  Prior Device Use: Indicate devices/aids used by the patient prior to current illness, exacerbation or injury? None of the above  Current  Functional Level Cognition  Arousal/Alertness: Awake/alert Overall Cognitive Status: Within Functional Limits for tasks assessed Orientation Level: Oriented X4 General Comments: Eyes open 1/2 of session, would open them when asked--reports opening eyes does not make her dizzy so not sure whey she is keeping them closed at times Attention: Focused, Sustained Focused Attention: Appears intact Sustained Attention: Appears intact Memory: Appears intact(Immediate: 3/3; Delayed: 3/3) Awareness: Appears intact Problem Solving: Appears intact(5/5) Executive Function: Reasoning, Sequencing Reasoning: Appears intact(3/3) Sequencing: Appears intact    Extremity Assessment (includes Sensation/Coordination)  Upper Extremity Assessment: Defer to OT evaluation, LUE deficits/detail LUE Deficits / Details: Ataxia noted during reaching for rail, touching nose.  LUE Sensation: WNL LUE Coordination: decreased fine motor, decreased gross motor  Lower Extremity Assessment: LLE deficits/detail(Grossly ~4 out of 5 throughout) LLE Sensation: WNL LLE Coordination: decreased fine motor, decreased gross motor    ADLs  Overall ADL's : Needs assistance/impaired Eating/Feeding Details (indicate cue  type and reason): min guard while seated EOB due to balance Grooming: Moderate assistance Grooming Details (indicate cue type and reason): supported sitting Upper Body Bathing: Minimal assistance Upper Body Bathing Details (indicate cue type and reason): supported sitting Lower Body Bathing: Total assistance Lower Body Bathing Details (indicate cue type and reason): Max A +2 sit<>stand due to posterior lean Upper Body Dressing : Moderate assistance Upper Body Dressing Details (indicate cue type and reason): supported sitting Lower Body Dressing: Total assistance Lower Body Dressing Details (indicate cue type and reason): Max A +2 sit<>stand due to posterior lean Toilet Transfer: Maximal assistance, +2 for physical assistance, Stand-pivot Toilet Transfer Details (indicate cue type and reason): bed>recliner going to pt's right Toileting- Clothing Manipulation and Hygiene: Total assistance Toileting - Clothing Manipulation Details (indicate cue type and reason): Max A +2 sit<>stand due to posterior lean    Mobility  Overal bed mobility: Needs Assistance Bed Mobility: Rolling, Sidelying to Sit Rolling: Mod assist Sidelying to sit: Max assist, HOB elevated Sit to sidelying: Mod assist, HOB elevated General bed mobility comments: intially tendency for posterior lean, does better having BUE support to decrease ataxia.    Transfers  Overall transfer level: Needs assistance Equipment used: 2 person hand held assist Transfers: Sit to/from Stand, Stand Pivot Transfers Sit to Stand: Max assist, +2 physical assistance Stand pivot transfers: Max assist, +2 physical assistance General transfer comment: posterior lean with increased time and cues to get hips/trunk over feet.     Ambulation / Gait / Stairs / Wheelchair Mobility  Ambulation/Gait General Gait Details: Deferred    Posture / Balance Dynamic Sitting Balance Sitting balance - Comments: Required intermittent Bil UE support while  seated EOB, with moments where it was only single UE support. Able to initiate spinal extensors for upright posture. Worked on breathing techniques sitting EOB. Balance Overall balance assessment: Needs assistance Sitting-balance support: Feet supported, Bilateral upper extremity supported Sitting balance-Leahy Scale: Poor Sitting balance - Comments: Required intermittent Bil UE support while seated EOB, with moments where it was only single UE support. Able to initiate spinal extensors for upright posture. Worked on breathing techniques sitting EOB. Standing balance support: Bilateral upper extremity supported Standing balance-Leahy Scale: Zero Standing balance comment: +2 Max A due to posterior lean    Special needs/care consideration BiPAP/CPAP  N/a CPM  N/a Continuous Drip IV  N/a Dialysis n/a Life Vest  N/a Oxygen  N/a Special Bed  N/a Trach Size  N/a Wound Vac n/a Skin  LOOP surgical incision Bowel mgmt:  No LBM documented Bladder mgmt:  continent Diabetic mgmt:  Hgb A1c 5.9 Behavioral consideration  N/a Chemo/radiation  N/a LOOP placed 5/26   Previous Home Environment  Living Arrangements: Alone  Lives With: Alone Available Help at Discharge: Family, Available 24 hours/day(daughter and son inlaw will provide 24/7 assist) Type of Home: House Home Layout: One level Home Access: Stairs to enter Entrance Stairs-Rails: Right Entrance Stairs-Number of Steps: 4 Bathroom Shower/Tub: Tub/shower unit, Architectural technologist: Standard Bathroom Accessibility: Yes How Accessible: Accessible via walker [place]: No  Discharge Living Setting Plans for Discharge Living Setting: Patient's home, Alone Type of Home at Discharge: House Discharge Home Layout: One level Discharge Home Access: Stairs to enter Entrance Stairs-Rails: Right Entrance Stairs-Number of Steps: 4 Discharge Bathroom Shower/Tub: Tub/shower unit, Curtain Discharge Bathroom Toilet: Standard  Discharge Bathroom Accessibility: Yes How Accessible: Accessible via walker Does the patient have any problems obtaining your medications?: No  Social/Family/Support Systems Patient Roles: Spouse, Parent, Caregiver(has temporary custody of great grand daughter, AVa) Contact Information: daughter SHerry Anticipated Caregiver: daughter, son inlaw and grand daughter , [place] Mam Anticipated Ambulance person Information: [phone] Caregiver Availability: 24/7 Discharge Plan Discussed with Primary Caregiver: Yes Is Caregiver In Agreement with Plan?: Yes Does Caregiver/Family have Issues with Lodging/Transportation while Pt is in Rehab?: No  Goals/Additional Needs Patient/Family Goal for Rehab: Mod I to supervision with PT and OT Expected length of stay: ELOS 10 to 12 days Pt/Family Agrees to Admission and willing to participate: Yes Program Orientation Provided & Reviewed with Pt/Caregiver Including Roles  & Responsibilities: Yes  Decrease burden of Care through IP rehab admission: n/a  Possible need for SNF placement upon discharge:  Possibly  Patient Condition: I have reviewed medical records from [facility], spoken with CM, and patient and daughter. I met with patient at the bedside for inpatient rehabilitation assessment.  Patient will benefit from ongoing PT and OT, can actively participate in 3 hours of therapy a day 5 days of the week, and can make measurable gains during the admission.  Patient will also benefit from the coordinated team approach during an Inpatient Acute Rehabilitation admission.  The patient will receive intensive therapy as well as Rehabilitation physician, nursing, social worker, and care management interventions.  Due to bladder management, bowel management, safety, skin/wound care, disease management, medication administration and patient education the patient requires 24 hour a day rehabilitation nursing.  The patient is currently max assist with  mobility and basic ADLs.  Discharge setting and therapy post discharge at home with home health is anticipated.  Patient has agreed to participate in the Acute Inpatient Rehabilitation Program and will admit today.  Preadmission Screen Completed By:  Cleatrice Burke RN MSN 11/17/2018 11:24 AM ______________________________________________________________________   Discussed status with Dr. Posey Pronto on 11/17/2018 at 1140 and received approval for admission today.  Admission Coordinator:  Cleatrice Burke, RN MSN time 1140 Date  11/17/2018   Assessment/Plan: Diagnosis: CVA  1. Does the need for close, 24 hr/day Medical supervision in concert with the patient's rehab needs make it unreasonable for this patient to be served in a less intensive setting? Yes  2. Co-Morbidities requiring supervision/potential complications: COPD, HTN, hypothyroidism, GERD presented to [facility] ER 3. Due to safety, disease management and patient education, does the patient require 24 hr/day rehab nursing? Yes 4. Does the patient require coordinated care of a physician, rehab nurse, PT (1-2 hrs/day, 5 days/week) and OT (1-2 hrs/day, 5 days/week) to address physical and functional deficits in the context of the above medical  diagnosis(es)? Yes Addressing deficits in the following areas: balance, endurance, locomotion, strength, transferring, bathing, dressing, toileting and psychosocial support 5. Can the patient actively participate in an intensive therapy program of at least 3 hrs of therapy 5 days a week? Yes 6. The potential for patient to make measurable gains while on inpatient rehab is excellent 7. Anticipated functional outcomes upon discharge from inpatients are: min assist and mod assist PT, min assist and mod assist OT, n/a SLP 8. Estimated rehab length of stay to reach the above functional goals is: 17-20 days. 9. Anticipated D/C setting: TBD 10. Anticipated post D/C treatments: HH therapy and  Home excercise program 11. Overall Rehab/Functional Prognosis: good  MD Signature: Delice Lesch, MD, ABPMR        Revision History

## 2018-11-17 NOTE — Progress Notes (Signed)
Patient arrived to floor with nurse from [facility] via wheelchair. Patient did a stand/pidvot to the bed with no complications. She does not complain of any pain. She is now resting comfortably in bed at this time.

## 2018-11-17 NOTE — Consult Note (Signed)
   [facility] Inpatient Consult   11/17/2018  Donnella Morford Mar 06, 1936 [id number]  Patient was assessed for [facility] for community services. Patient was previously active with [facility].  Patient in the Medicare [place].  Chart review and reveals patient is for admission to inpatient rehab s/p tPA for acute left superior cerebellar artery territory infarct.  Patient had previously been outreached for medication management from her primary care provider.  Primary Care Provider:  Marco Collie, MD  If patient admitted to inpatient rehab.  [facility] can follow for disposition and needs for post rehab as appropriate,   For additional questions or referrals please contact:  Natividad Brood, RN BSN [facility] Liaison  [phone] business mobile phone Toll free office [phone]  Fax number: [phone] [email or web address]

## 2018-11-17 NOTE — PMR Pre-admission (Signed)
PMR Admission Coordinator Pre-Admission Assessment  Patient: Stephanie Love is an 83 y.o., female MRN: [id number] DOB: [birth year] Height: '5\' 2"'  (157.5 cm)Weight: 59 kg  Insurance Information HMO:     PPO:      PCP:      IPA:      80/20:      OTHER: no HMO PRIMARY: Medicare a and b      Policy#: [id number]      Subscriber: pt Benefits:  Phone #: passport one online     Name: 5/27 Eff. Date: 09/22/2002     Deduct: $1408      Out of Pocket Max: none      Life Max: none CIR: 100%      SNF: 20 full days Outpatient:  80%     Co-Pay: 20% Home Health: 100%      Co-Pay: none DME: 80%     Co-Pay: 20% Providers: pt choice  SECONDARY: [employer]      Policy#: [id number]      Subscriber: pt  Medicaid Application Date:       Case Manager:  Disability Application Date:       Case Worker:   The "Data Collection Information Summary" for patients in Inpatient Rehabilitation Facilities with attached "Privacy Act [place] Records" was provided and verbally reviewed with: Patient  Emergency Contact Information Contact Information    Name Relation Home Work [place] Daughter   [phone]      Current Medical History  Patient Admitting Diagnosis: CVA  History of Present Illness: 83 year old with past medical history of COPD, HTN, hypothyroidism, GERD presented to [facility] ER on 5/24 with sudden onset of dizziness. Had nausea and vomiting. Noted to have disconjugate gaze, dysarthria and left sided ataxia. Patient given IV TPA and transferred to [facility] hospital.  CT head at [facility] showed no acute findings. CT angio no LVO. COVID test negative. 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  TEE cancelled by Dr. Leonie Man. LOOP Placed 5/26. CTA neck performed. No significance. 2D echo normal EF 60 to 65%. LDL 65. VTE prophylaxis  SCD's.  Patient on no antithrombotic pta, placed on Asa 325 mg 24 hrs after TPA.  No lipid lowering med due to liver disease.  Bilateral LE dopplers negative for DVTs.   Complete NIHSS TOTAL: 0  Patient's medical record from [facility] has been reviewed by the rehabilitation admission coordinator and physician.  Past Medical History  Past Medical History:  Diagnosis Date  . Anemia   . Arthritis   . COPD (chronic obstructive pulmonary disease) ([place])   . Diverticulosis   . GERD (gastroesophageal reflux disease)   . Hypertension   . Hypothyroid   . NASH (nonalcoholic steatohepatitis)   . Osteoarthritis   . Stroke ([place]) 11/13/2018  . UTI (urinary tract infection)     Family History   family history is not on file.  Prior Rehab/Hospitalizations Has the patient had prior rehab or hospitalizations prior to admission? Yes  Has the patient had major surgery during 100 days prior to admission? No   Current Medications  Current Facility-Administered Medications:  .  acetaminophen (TYLENOL) tablet 650 mg, 650 mg, Oral, Q4H PRN, 650 mg at 11/16/18 2140 **OR** acetaminophen (TYLENOL) solution 650 mg, 650 mg, Per Tube, Q4H PRN **OR** acetaminophen (TYLENOL) suppository 650 mg, 650 mg, Rectal, Q4H  PRN, Thompson Grayer, MD .  albuterol (PROVENTIL) (2.5 MG/3ML) 0.083% nebulizer solution 2.5 mg, 2.5 mg, Inhalation, Q6H PRN, Allred, James, MD .  aspirin EC tablet 325 mg, 325 mg, Oral, Daily, Allred, James, MD, 325 mg at 11/17/18 0827 .  Chlorhexidine Gluconate Cloth 2 % PADS 6 each, 6 each, Topical, Q0600, Thompson Grayer, MD, 6 each at 11/17/18 [phone] .  fluticasone (FLONASE) 50 MCG/ACT nasal spray 1-2 spray, 1-2 spray, Each Nare, Daily PRN, Allred, James, MD .  labetalol (NORMODYNE) injection 5 mg, 5 mg, Intravenous, Q10 min PRN, Allred, James, MD .  levothyroxine (SYNTHROID) tablet 112 mcg, 112 mcg, Oral, QAC breakfast, Allred, Jeneen Rinks, MD, 112 mcg at 11/17/18 [phone] .  mupirocin ointment  (BACTROBAN) 2 % 1 application, 1 application, Nasal, BID, Allred, Jeneen Rinks, MD, 1 application at 29/51/88 [phone] .  ondansetron (ZOFRAN) 8 mg in sodium chloride 0.9 % 50 mL IVPB, 8 mg, Intravenous, Q8H PRN, Allred, James, MD .  pantoprazole (PROTONIX) injection 40 mg, 40 mg, Intravenous, QHS, Allred, James, MD, 40 mg at 11/16/18 2130 .  prochlorperazine (COMPAZINE) injection 10 mg, 10 mg, Intravenous, Q6H PRN, Allred, James, MD, 10 mg at 11/14/18 1709 .  roflumilast (DALIRESP) tablet 500 mcg, 500 mcg, Oral, Daily, Allred, James, MD, 500 mcg at 11/17/18 0826 .  senna (SENOKOT) tablet 8.6 mg, 1 tablet, Oral, Daily, Allred, James, MD, 8.6 mg at 11/17/18 0827 .  senna-docusate (Senokot-S) tablet 1 tablet, 1 tablet, Oral, QHS PRN, Allred, James, MD .  umeclidinium bromide (INCRUSE ELLIPTA) 62.5 MCG/INH 1 puff, 1 puff, Inhalation, Daily, Allred, James, MD, 1 puff at 11/17/18 0827  Patients Current Diet:  Diet Order            Diet Heart Room service appropriate? Yes; Fluid consistency: Thin  Diet effective now              Precautions / Restrictions Precautions Precautions: Fall Precaution Comments: ataxia Restrictions Weight Bearing Restrictions: No   Has the patient had 2 or more falls or a fall with injury in the past year? No  Prior Activity Level Community (5-7x/wk): active, caregiver for 63 month old great grand daughter  Prior Functional Level Self Care: Did the patient need help bathing, dressing, using the toilet or eating? Independent  Indoor Mobility: Did the patient need assistance with walking from room to room (with or without device)? Independent  Stairs: Did the patient need assistance with internal or external stairs (with or without device)? Independent  Functional Cognition: Did the patient need help planning regular tasks such as shopping or remembering to take medications? [place] Devices/Equipment: Cane (specify  quad or straight), Walker (specify type) Home Equipment: Walker - 2 wheels, Cane - single point  Prior Device Use: Indicate devices/aids used by the patient prior to current illness, exacerbation or injury? None of the above  Current Functional Level Cognition  Arousal/Alertness: Awake/alert Overall Cognitive Status: Within Functional Limits for tasks assessed Orientation Level: Oriented X4 General Comments: Eyes open 1/2 of session, would open them when asked--reports opening eyes does not make her dizzy so not sure whey she is keeping them closed at times Attention: Focused, Sustained Focused Attention: Appears intact Sustained Attention: Appears intact Memory: Appears intact(Immediate: 3/3; Delayed: 3/3) Awareness: Appears intact Problem Solving: Appears intact(5/5) Executive Function: Reasoning, Sequencing Reasoning: Appears intact(3/3) Sequencing: Appears intact    Extremity Assessment (includes Sensation/Coordination)  Upper Extremity Assessment: Defer to OT evaluation, LUE deficits/detail LUE Deficits / Details:  Ataxia noted during reaching for rail, touching nose.  LUE Sensation: WNL LUE Coordination: decreased fine motor, decreased gross motor  Lower Extremity Assessment: LLE deficits/detail(Grossly ~4 out of 5 throughout) LLE Sensation: WNL LLE Coordination: decreased fine motor, decreased gross motor    ADLs  Overall ADL's : Needs assistance/impaired Eating/Feeding Details (indicate cue type and reason): min guard while seated EOB due to balance Grooming: Moderate assistance Grooming Details (indicate cue type and reason): supported sitting Upper Body Bathing: Minimal assistance Upper Body Bathing Details (indicate cue type and reason): supported sitting Lower Body Bathing: Total assistance Lower Body Bathing Details (indicate cue type and reason): Max A +2 sit<>stand due to posterior lean Upper Body Dressing : Moderate assistance Upper Body Dressing Details  (indicate cue type and reason): supported sitting Lower Body Dressing: Total assistance Lower Body Dressing Details (indicate cue type and reason): Max A +2 sit<>stand due to posterior lean Toilet Transfer: Maximal assistance, +2 for physical assistance, Stand-pivot Toilet Transfer Details (indicate cue type and reason): bed>recliner going to pt's right Toileting- Clothing Manipulation and Hygiene: Total assistance Toileting - Clothing Manipulation Details (indicate cue type and reason): Max A +2 sit<>stand due to posterior lean    Mobility  Overal bed mobility: Needs Assistance Bed Mobility: Rolling, Sidelying to Sit Rolling: Mod assist Sidelying to sit: Max assist, HOB elevated Sit to sidelying: Mod assist, HOB elevated General bed mobility comments: intially tendency for posterior lean, does better having BUE support to decrease ataxia.    Transfers  Overall transfer level: Needs assistance Equipment used: 2 person hand held assist Transfers: Sit to/from Stand, Stand Pivot Transfers Sit to Stand: Max assist, +2 physical assistance Stand pivot transfers: Max assist, +2 physical assistance General transfer comment: posterior lean with increased time and cues to get hips/trunk over feet.     Ambulation / Gait / Stairs / Wheelchair Mobility  Ambulation/Gait General Gait Details: Deferred    Posture / Balance Dynamic Sitting Balance Sitting balance - Comments: Required intermittent Bil UE support while seated EOB, with moments where it was only single UE support. Able to initiate spinal extensors for upright posture. Worked on breathing techniques sitting EOB. Balance Overall balance assessment: Needs assistance Sitting-balance support: Feet supported, Bilateral upper extremity supported Sitting balance-Leahy Scale: Poor Sitting balance - Comments: Required intermittent Bil UE support while seated EOB, with moments where it was only single UE support. Able to initiate spinal extensors  for upright posture. Worked on breathing techniques sitting EOB. Standing balance support: Bilateral upper extremity supported Standing balance-Leahy Scale: Zero Standing balance comment: +2 Max A due to posterior lean    Special needs/care consideration BiPAP/CPAP  N/a CPM  N/a Continuous Drip IV  N/a Dialysis n/a Life Vest  N/a Oxygen  N/a Special Bed  N/a Trach Size  N/a Wound Vac n/a Skin  LOOP surgical incision Bowel mgmt:  No LBM documented Bladder mgmt:  continent Diabetic mgmt:  Hgb A1c 5.9 Behavioral consideration  N/a Chemo/radiation  N/a LOOP placed 5/26   Previous Home Environment  Living Arrangements: Alone  Lives With: Alone Available Help at Discharge: Family, Available 24 hours/day(daughter and son inlaw will provide 24/7 assist) Type of Home: House Home Layout: One level Home Access: Stairs to enter Entrance Stairs-Rails: Right Entrance Stairs-Number of Steps: 4 Bathroom Shower/Tub: Tub/shower unit, Architectural technologist: Standard Bathroom Accessibility: Yes How Accessible: Accessible via walker Home Care Services: No  Discharge Living Setting Plans for Discharge Living Setting: Patient's home, Alone Type of Home at Discharge:  House Discharge Home Layout: One level Discharge Home Access: Stairs to enter Entrance Stairs-Rails: Right Entrance Stairs-Number of Steps: 4 Discharge Bathroom Shower/Tub: Tub/shower unit, Curtain Discharge Bathroom Toilet: Standard Discharge Bathroom Accessibility: Yes How Accessible: Accessible via walker Does the patient have any problems obtaining your medications?: No  Social/Family/Support Systems Patient Roles: Spouse, Parent, Caregiver(has temporary custody of great grand daughter, AVa) Contact Information: daughter SHerry Anticipated Caregiver: daughter, son inlaw and grand daughter , [place] Mam Anticipated Ambulance person Information: [phone] Caregiver Availability: 24/7 Discharge Plan Discussed with  Primary Caregiver: Yes Is Caregiver In Agreement with Plan?: Yes Does Caregiver/Family have Issues with Lodging/Transportation while Pt is in Rehab?: No  Goals/Additional Needs Patient/Family Goal for Rehab: Mod I to supervision with PT and OT Expected length of stay: ELOS 10 to 12 days Pt/Family Agrees to Admission and willing to participate: Yes Program Orientation Provided & Reviewed with Pt/Caregiver Including Roles  & Responsibilities: Yes  Decrease burden of Care through IP rehab admission: n/a  Possible need for SNF placement upon discharge:  Possibly  Patient Condition: I have reviewed medical records from [facility], spoken with CM, and patient and daughter. I met with patient at the bedside for inpatient rehabilitation assessment.  Patient will benefit from ongoing PT and OT, can actively participate in 3 hours of therapy a day 5 days of the week, and can make measurable gains during the admission.  Patient will also benefit from the coordinated team approach during an Inpatient Acute Rehabilitation admission.  The patient will receive intensive therapy as well as Rehabilitation physician, nursing, social worker, and care management interventions.  Due to bladder management, bowel management, safety, skin/wound care, disease management, medication administration and patient education the patient requires 24 hour a day rehabilitation nursing.  The patient is currently max assist with mobility and basic ADLs.  Discharge setting and therapy post discharge at home with home health is anticipated.  Patient has agreed to participate in the Acute Inpatient Rehabilitation Program and will admit today.  Preadmission Screen Completed By:  Cleatrice Burke RN MSN 11/17/2018 11:24 AM ______________________________________________________________________   Discussed status with Dr. Posey Pronto on 11/17/2018 at 1140 and received approval for admission today.  Admission Coordinator:  Cleatrice Burke, RN MSN time 1140 Date  11/17/2018   Assessment/Plan: Diagnosis: CVA  1. Does the need for close, 24 hr/day Medical supervision in concert with the patient's rehab needs make it unreasonable for this patient to be served in a less intensive setting? Yes  2. Co-Morbidities requiring supervision/potential complications: COPD, HTN, hypothyroidism, GERD presented to [facility] ER 3. Due to safety, disease management and patient education, does the patient require 24 hr/day rehab nursing? Yes 4. Does the patient require coordinated care of a physician, rehab nurse, PT (1-2 hrs/day, 5 days/week) and OT (1-2 hrs/day, 5 days/week) to address physical and functional deficits in the context of the above medical diagnosis(es)? Yes Addressing deficits in the following areas: balance, endurance, locomotion, strength, transferring, bathing, dressing, toileting and psychosocial support 5. Can the patient actively participate in an intensive therapy program of at least 3 hrs of therapy 5 days a week? Yes 6. The potential for patient to make measurable gains while on inpatient rehab is excellent 7. Anticipated functional outcomes upon discharge from inpatients are: min assist and mod assist PT, min assist and mod assist OT, n/a SLP 8. Estimated rehab length of stay to reach the above functional goals is: 17-20 days. 9. Anticipated D/C setting: TBD 10. Anticipated post  D/C treatments: HH therapy and Home excercise program 11. Overall Rehab/Functional Prognosis: good  MD Signature: Delice Lesch, MD, ABPMR

## 2018-11-17 NOTE — Care Management (Signed)
Pt discharging to CIR today. CM signing off.  

## 2018-11-17 NOTE — Care Management Important Message (Signed)
Important Message  Patient Details  Name: Stephanie Love MRN: [id number] Date of Birth: [birth year]   Medicare Important Message Given:  Yes  Due to illness patient did not sign.  Unsigned copy left.  Harald Quevedo 11/17/2018, 1:36 PM

## 2018-11-17 NOTE — Progress Notes (Signed)
Inpatient Rehabilitation Admissions Coordinator   Inpatient Rehab Consult received. I met with patient at the bedside for rehabilitation assessment.  I contacted her daughter, Judeen Hammans, by conference call. We discussed goals and expectations of an inpatient rehab admission.  Both in agreement to CIR admit. Family will arrange supervision at d/c. I contacted Dr. Leonie Man and he is cancelling the TEE. Patient has LOOP. I have notified RN CM an SW. I will make the arrangements to admit today.  Danne Baxter, RN, MSN Rehab Admissions Coordinator [phone] 11/17/2018 10:52 AM

## 2018-11-17 NOTE — H&P (Signed)
Physical Medicine and Rehabilitation Admission H&P    CC: Functional decline.   HPI: Stephanie Love is an 83 year old female with history of COPD, NASH, anemia who was evaluated at [facility] for speech difficulty, visual changes and ataxia. CT head negative and she received tPA and was transferred to [facility] on 11/14/2018 from [facility].  History history taken from chart review and patient.  CTA at outside hospital without LVO.  Echocardiogram with ejection fraction of 60 to 65%.  MRI brain reviewed, showing scattered infarcts/bleeds.  Per report, acute left superior cerebellar infarct with acute bilateral parietal and right frontal infarcts aiwht small volume right parieto-occipital hemorrhage and small right SDH.   Stroke felt to be cardioembolic and Loop recorder  placed by Dr. Rayann Heman.  ASA added for secondary stroke prevention.  No plans for TEE. Therapy evaluations completed revealing balance deficits with posterior lean and tendency to keep eyes closed with mobility. CIR recommended due to functional decline.    Review of Systems  Constitutional: Positive for malaise/fatigue (feels that iron is getting low again). Negative for chills and fever.  HENT: Positive for hearing loss (worse on left). Negative for tinnitus.   Eyes: Negative for blurred vision and double vision.  Respiratory: Positive for shortness of breath. Negative for cough.   Cardiovascular: Negative for chest pain and palpitations.  Gastrointestinal: Positive for constipation and nausea. Negative for abdominal pain.  Genitourinary: Negative for dysuria and urgency.  Musculoskeletal: Positive for joint pain (bilateral shoulder pain) and myalgias.  Neurological: Positive for dizziness, focal weakness and weakness. Negative for sensory change and headaches.  Psychiatric/Behavioral: Negative for memory loss.  All other systems reviewed and are negative.  Past Medical History:  Diagnosis Date  . Anemia   . Arthritis   . COPD (chronic  obstructive pulmonary disease) ([place])   . Diverticulosis   . GERD (gastroesophageal reflux disease)   . Hepatitis A virus infection 1963  . Hypertension   . Hypothyroid   . NASH (nonalcoholic steatohepatitis)   . Osteoarthritis   . Stroke ([place]) 11/13/2018  . UTI (urinary tract infection)     Past Surgical History:  Procedure Laterality Date  . ABDOMINAL HYSTERECTOMY  2012  . AMPUTATION TOE     right 2nd toe   . CHOLECYSTECTOMY  2013  . LOOP RECORDER INSERTION N/A 11/16/2018   Procedure: LOOP RECORDER INSERTION;  Surgeon: Thompson Grayer, MD;  Location: [facility];  Service: Cardiovascular;  Laterality: N/A;    Family History  Problem Relation Age of Onset  . Cancer Mother   . Diabetes Father   . Heart disease Father      Social History:  Widowed. Lives alone and independent PTA.  She smoked for 31 years--1 PPD.  She has never used smokeless tobacco. She reports previous alcohol use. She reports that she does not use drugs.    Allergies  Allergen Reactions  . Penicillins Anaphylaxis    Did it involve swelling of the face/tongue/throat, SOB, or low BP? yes Did it involve sudden or severe rash/hives, skin peeling, or any reaction on the inside of your mouth or nose? no Did you need to seek medical attention at a hospital or doctor's office? yes When did it last happen?pt was 22 If all above answers are "NO", may proceed with cephalosporin use.   . Codeine     hyper    Medications Prior to Admission  Medication Sig Dispense Refill  . acetaminophen (TYLENOL) 325 MG tablet Take  2 tablets (650 mg total) by mouth every 4 (four) hours as needed for mild pain (or temp > 37.5 C (99.5 F)).    Marland Kitchen albuterol (PROVENTIL) (2.5 MG/3ML) 0.083% nebulizer solution Inhale 3 mLs (2.5 mg total) into the lungs every 6 (six) hours as needed for wheezing or shortness of breath. 75 mL 12  . arformoterol (BROVANA) 15 MCG/2ML NEBU Take 15 mcg by nebulization every 12 (twelve) hours.    Derrill Memo ON 11/18/2018] aspirin EC 325 MG EC tablet Take 1 tablet (325 mg total) by mouth daily. 30 tablet 0  . cholecalciferol (VITAMIN D3) 25 MCG (1000 UT) tablet Take 1,000 Units by mouth daily.    Marland Kitchen escitalopram (LEXAPRO) 10 MG tablet Take 10 mg by mouth daily.     . fluticasone (FLONASE) 50 MCG/ACT nasal spray Place 1-2 sprays into both nostrils daily as needed for allergies or rhinitis.    Marland Kitchen lansoprazole (PREVACID) 15 MG capsule Take 15 mg by mouth daily at 12 noon.    Marland Kitchen levothyroxine (SYNTHROID, LEVOTHROID) 112 MCG tablet Take 112 mcg by mouth daily before breakfast.    . losartan (COZAAR) 25 MG tablet Take 25 mg by mouth daily.    . metoprolol succinate (TOPROL-XL) 25 MG 24 hr tablet Take 25 mg by mouth daily.    . roflumilast (DALIRESP) 500 MCG TABS tablet Take 500 mcg by mouth daily.     Derrill Memo ON 11/18/2018] senna (SENOKOT) 8.6 MG TABS tablet Take 1 tablet (8.6 mg total) by mouth daily. 120 each 0  . senna-docusate (SENOKOT-S) 8.6-50 MG tablet Take 1 tablet by mouth at bedtime as needed for mild constipation.    . Tiotropium Bromide Monohydrate (SPIRIVA RESPIMAT) 1.25 MCG/ACT AERS Inhale 2 puffs into the lungs daily.      Drug Regimen Review  Drug regimen was reviewed and remains appropriate with no significant issues identified  Home: Home Living Family/patient expects to be discharged to:: Inpatient rehab Living Arrangements: Alone Available Help at Discharge: Family, Available 24 hours/day(daughter and son inlaw will provide 24/7 assist) Type of Home: House Home Access: Stairs to enter Entrance Stairs-Number of Steps: 4 Entrance Stairs-Rails: Right Home Layout: One level Bathroom Shower/Tub: Tub/shower unit, Architectural technologist: Standard Bathroom Accessibility: Yes Home Equipment: Environmental consultant - 2 wheels, [place] - single point  Lives With: Alone   Functional History: Prior Function Level of Independence: Independent Comments: Drives, cooks, cleans. Cares for self.    Functional Status:  Mobility: Bed Mobility Overal bed mobility: Needs Assistance Bed Mobility: Rolling, Sidelying to Sit Rolling: Mod assist Sidelying to sit: Max assist, HOB elevated Sit to sidelying: Mod assist, HOB elevated General bed mobility comments: pt OOB in chair upon arrival Transfers Overall transfer level: Needs assistance Equipment used: Rolling walker (2 wheeled) Transfers: Sit to/from Stand Sit to Stand: Max assist, +2 physical assistance, Mod assist Stand pivot transfers: Max assist, +2 physical assistance General transfer comment: posterior lean with increased time and cues to get hips/trunk over feet.  Ambulation/Gait General Gait Details: Deferred    ADL: ADL Overall ADL's : Needs assistance/impaired Eating/Feeding Details (indicate cue type and reason): min guard while seated EOB due to balance Grooming: Moderate assistance Grooming Details (indicate cue type and reason): supported sitting Upper Body Bathing: Minimal assistance Upper Body Bathing Details (indicate cue type and reason): supported sitting Lower Body Bathing: Total assistance Lower Body Bathing Details (indicate cue type and reason): Max A +2 sit<>stand due to posterior lean Upper Body Dressing : Moderate  assistance Upper Body Dressing Details (indicate cue type and reason): supported sitting Lower Body Dressing: Total assistance Lower Body Dressing Details (indicate cue type and reason): Max A +2 sit<>stand due to posterior lean Toilet Transfer: Maximal assistance, +2 for physical assistance, Stand-pivot Toilet Transfer Details (indicate cue type and reason): bed>recliner going to pt's right Toileting- Clothing Manipulation and Hygiene: Total assistance Toileting - Clothing Manipulation Details (indicate cue type and reason): Max A +2 sit<>stand due to posterior lean  Cognition: Cognition Overall Cognitive Status: Within Functional Limits for tasks assessed Arousal/Alertness: Awake/alert  Orientation Level: Oriented X4 Attention: Focused, Sustained Focused Attention: Appears intact Sustained Attention: Appears intact Memory: Appears intact(Immediate: 3/3; Delayed: 3/3) Awareness: Appears intact Problem Solving: Appears intact(5/5) Executive Function: Reasoning, Sequencing Reasoning: Appears intact(3/3) Sequencing: Appears intact Cognition Arousal/Alertness: Awake/alert Behavior During Therapy: WFL for tasks assessed/performed Overall Cognitive Status: Within Functional Limits for tasks assessed General Comments: Eyes open 1/2 of session, would open them when asked--reports opening eyes does not make her dizzy so not sure whey she is keeping them closed at times   Blood pressure 132/66, pulse 86, temperature 99.3 F (37.4 C), temperature source Oral, resp. rate 20, height 5\' 2"  (1.575 m), weight 59 kg, SpO2 97 %. Physical Exam  Vitals reviewed. Constitutional: She appears well-developed and well-nourished.  HENT:  Head: Normocephalic and atraumatic.  Eyes: EOM are normal. Right eye exhibits no discharge. Left eye exhibits no discharge.  Respiratory: Effort normal. No respiratory distress.  GI: She exhibits no distension.  Musculoskeletal:     Comments: No edema or tenderness in extremities  Neurological: She is alert.  Motor: Right upper extremity/right lower extremity: 4-4+/5 proximal distal Left upper extremity/left lower extremity: 4/5 proximal distal HOH  Skin: Skin is warm and dry.  Psychiatric: She has a normal mood and affect. Her behavior is normal.    No results found for this or any previous visit (from the past 48 hour(s)). Vas [place] Lower Extremity Venous (dvt)  Result Date: 11/16/2018  Lower Venous Study Indications: Embolic stroke.  Performing Technologist: Oliver Hum RVT  Examination Guidelines: A complete evaluation includes B-mode imaging, spectral Doppler, color Doppler, and power Doppler as needed of all accessible portions of each vessel.  Bilateral testing is considered an integral part of a complete examination. Limited examinations for reoccurring indications may be performed as noted.  +---------+---------------+---------+-----------+----------+-------+ RIGHT    CompressibilityPhasicitySpontaneityPropertiesSummary +---------+---------------+---------+-----------+----------+-------+ CFV      Full           Yes      Yes                          +---------+---------------+---------+-----------+----------+-------+ SFJ      Full                                                 +---------+---------------+---------+-----------+----------+-------+ FV Prox  Full                                                 +---------+---------------+---------+-----------+----------+-------+ FV Mid   Full                                                 +---------+---------------+---------+-----------+----------+-------+  FV DistalFull                                                 +---------+---------------+---------+-----------+----------+-------+ PFV      Full                                                 +---------+---------------+---------+-----------+----------+-------+ POP      Full           Yes      Yes                          +---------+---------------+---------+-----------+----------+-------+ PTV      Full                                                 +---------+---------------+---------+-----------+----------+-------+ PERO     Full                                                 +---------+---------------+---------+-----------+----------+-------+   +---------+---------------+---------+-----------+----------+-------+ LEFT     CompressibilityPhasicitySpontaneityPropertiesSummary +---------+---------------+---------+-----------+----------+-------+ CFV      Full           Yes      Yes                          +---------+---------------+---------+-----------+----------+-------+  SFJ      Full                                                 +---------+---------------+---------+-----------+----------+-------+ FV Prox  Full                                                 +---------+---------------+---------+-----------+----------+-------+ FV Mid   Full                                                 +---------+---------------+---------+-----------+----------+-------+ FV DistalFull                                                 +---------+---------------+---------+-----------+----------+-------+ PFV      Full                                                 +---------+---------------+---------+-----------+----------+-------+ POP  Full           Yes      Yes                          +---------+---------------+---------+-----------+----------+-------+ PTV      Full                                                 +---------+---------------+---------+-----------+----------+-------+ PERO     Full                                                 +---------+---------------+---------+-----------+----------+-------+     Summary: Right: There is no evidence of deep vein thrombosis in the lower extremity. No cystic structure found in the popliteal fossa. Left: There is no evidence of deep vein thrombosis in the lower extremity. No cystic structure found in the popliteal fossa.  *See table(s) above for measurements and observations. Electronically signed by Deitra Mayo MD on 11/16/2018 at 6:30:14 AM.    Final        Medical Problem List and Plan: 1.  Weakness, balance deficits with posterior lean secondary to bilateral infarcts as well as right brain bleeds.  Admit to CIR 2.  Antithrombotics: -DVT/anticoagulation:  Mechanical: Sequential compression devices, below knee Bilateral lower extremities  -antiplatelet therapy: ASA 3. Pain Management: Tylenol prn.  4. Mood: LCSW to follow for evaluation and support.   -antipsychotic  agents: N/A 5. Neuropsych: This patient is capable of making decisions on her own behalf. 6. Skin/Wound Care: Routine pressure relief measures.  7. Fluids/Electrolytes/Nutrition: Routine I/Os.  BMP ordered for tomorrow a.m. 8. HTN: Monitor BP bid. Continue to hold Cozaar and Metoprolol---resume medications as indicated.  Monitor with increased mobility. 9.  Dyslipidemia: NO meds due to NASH  10. COPD: Continue Daliresp and Incruse daily.   11. Hypothyroid: On supplement.  12.Transient urinary retention: Monitor for now.  Needed caths for few days per reports.  Will consider PVRs necessary. 13.H/o anemia: has had iron supplement in the past.  CBC ordered.  Bary Leriche, PA-C 11/17/2018

## 2018-11-17 NOTE — IPOC Note (Signed)
Individualized overall Plan of Care [facility]) Patient Details Name: Stephanie Love MRN: [id number] DOB: 06-02-1936  Admitting Diagnosis: Bilateral embolic stroke  Hospital Problems: Active Problems:   Embolic stroke ([place])   Hypoalbuminemia due to protein-calorie malnutrition ([place])   Prediabetes     Functional Problem List: Nursing Endurance, Medication Management, Safety, Motor  PT Balance, Endurance, Motor, Sensory, Safety  OT Balance, Endurance, Motor  SLP    TR         Basic ADL's: OT Bathing, Dressing, Toileting     Advanced  ADL's: OT       Transfers: PT Bed Mobility, Bed to Chair, Car, Furniture, Futures trader, Metallurgist: PT Ambulation, Emergency planning/management officer, Stairs     Additional Impairments: OT    SLP        TR      Anticipated Outcomes Item Anticipated Outcome  Self Feeding Independent  Swallowing      Basic self-care  S overall  Toileting  S    Bathroom Transfers S  Bowel/Bladder  Patient to continue to be continent of bowel and bladder during admission  Transfers  S  Locomotion  S  Communication     Cognition     Pain  Patient will be pain free or pain less than 3 during admission  Safety/Judgment  Patient will be free from falls and adhere to safety plan during admission   Therapy Plan: PT Intensity: Minimum of 1-2 x/day ,45 to 90 minutes PT Frequency: 5 out of 7 days PT Duration Estimated Length of Stay: 12-14 days OT Intensity: Minimum of 1-2 x/day, 45 to 90 minutes OT Frequency: 5 out of 7 days OT Duration/Estimated Length of Stay: 12-14 days      Team Interventions: Nursing Interventions Patient/Family Education, Psychosocial Support, Disease Management/Prevention, Medication Management, Discharge Planning  PT interventions Ambulation/gait training, Functional mobility training, Therapeutic Activities, Wheelchair propulsion/positioning, Therapeutic Exercise, Neuromuscular re-education, Human resources officer, DME/adaptive equipment instruction, UE/LE Strength taining/ROM, UE/LE Coordination activities, Stair training, Patient/family education  OT Interventions Training and development officer, Discharge planning, Disease mangement/prevention, DME/adaptive equipment instruction, Functional mobility training, Psychosocial support, Patient/family education, Neuromuscular re-education, Self Care/advanced ADL retraining, Therapeutic Activities, UE/LE Coordination activities, UE/LE Strength taining/ROM, Therapeutic Exercise  SLP Interventions    TR Interventions    SW/CM Interventions Discharge Planning, Psychosocial Support, Patient/Family Education   Barriers to Discharge MD  Medical stability and Lack of/limited family support  Nursing      PT Decreased caregiver support Family can provide assistance at d/c, unsure if ongoing  OT      SLP      SW       Team Discharge Planning: Destination: PT-Home ,OT- Home , SLP-  Projected Follow-up: PT-Home health PT, OT-  Home health OT, SLP-  Projected Equipment Needs: PT-To be determined, OT- Tub/shower bench, Tub/shower seat, SLP-  Equipment Details: PT-reports has RW at home, OT-TBD seat or bench Patient/family involved in discharge planning: PT- Patient,  OT-Patient, SLP-   MD ELOS: 12-15 days. Medical Rehab Prognosis:  Good Assessment: n Stephanie Love with history of COPD, NASH, anemia who was evaluated at [facility] for speech difficulty, visual changes and ataxia. CT head negative and she received tPA and was transferred to [facility] on 11/14/2018 from [facility].  CTA at outside hospital without LVO.  Echocardiogram with ejection fraction of 60 to 65%.  MRI brain reviewed, showing scattered infarcts/bleeds.  Per report, acute left superior cerebellar infarct with acute bilateral parietal and  right frontal infarcts aiwht small volume right parieto-occipital hemorrhage and small right SDH.   Stroke felt to be cardioembolic and Loop recorder  placed by Dr. Rayann Heman.   ASA added for secondary stroke prevention.  No plans for TEE. Therapy evaluations completed revealing balance deficits with posterior lean and tendency to keep eyes closed with mobility.  We will set goals for Supervision with PT/OT.   Due to the current state of emergency, patients may not be receiving their 3-hours of Medicare-mandated therapy.  See Team Conference Notes for weekly updates to the plan of care

## 2018-11-17 NOTE — H&P (Signed)
Physical Medicine and Rehabilitation Admission H&P    CC: Functional decline.   HPI: Stephanie Love is an 83 year old female with history of COPD, NASH, anemia who was evaluated at [facility] for speech difficulty, visual changes and ataxia. CT head negative and she received tPA and was transferred to [facility] on 11/14/2018 from [facility].  History history taken from chart review and patient.  CTA at outside hospital without LVO.  Echocardiogram with ejection fraction of 60 to 65%.  MRI brain reviewed, showing scattered infarcts/bleeds.  Per report, acute left superior cerebellar infarct with acute bilateral parietal and right frontal infarcts aiwht small volume right parieto-occipital hemorrhage and small right SDH.   Stroke felt to be cardioembolic and Loop recorder  placed by Dr. Rayann Heman.  ASA added for secondary stroke prevention.  No plans for TEE. Therapy evaluations completed revealing balance deficits with posterior lean and tendency to keep eyes closed with mobility. CIR recommended due to functional decline.    Review of Systems  Constitutional: Positive for malaise/fatigue (feels that iron is getting low again). Negative for chills and fever.  HENT: Positive for hearing loss (worse on left). Negative for tinnitus.   Eyes: Negative for blurred vision and double vision.  Respiratory: Positive for shortness of breath. Negative for cough.   Cardiovascular: Negative for chest pain and palpitations.  Gastrointestinal: Positive for constipation and nausea. Negative for abdominal pain.  Genitourinary: Negative for dysuria and urgency.  Musculoskeletal: Positive for joint pain (bilateral shoulder pain) and myalgias.  Neurological: Positive for dizziness, focal weakness and weakness. Negative for sensory change and headaches.  Psychiatric/Behavioral: Negative for memory loss.  All other systems reviewed and are negative.  Past Medical History:  Diagnosis Date  . Anemia   . Arthritis   . COPD (chronic  obstructive pulmonary disease) ([place])   . Diverticulosis   . GERD (gastroesophageal reflux disease)   . Hepatitis A virus infection 1963  . Hypertension   . Hypothyroid   . NASH (nonalcoholic steatohepatitis)   . Osteoarthritis   . Stroke ([place]) 11/13/2018  . UTI (urinary tract infection)     Past Surgical History:  Procedure Laterality Date  . ABDOMINAL HYSTERECTOMY  2012  . AMPUTATION TOE     right 2nd toe   . CHOLECYSTECTOMY  2013  . LOOP RECORDER INSERTION N/A 11/16/2018   Procedure: LOOP RECORDER INSERTION;  Surgeon: Thompson Grayer, MD;  Location: [facility] [place] [facility];  Service: Cardiovascular;  Laterality: N/A;    Family History  Problem Relation Age of Onset  . Cancer Mother   . Diabetes Father   . Heart disease Father      Social History:  Widowed. Lives alone and independent PTA.  She smoked for 31 years--1 PPD.  She has never used smokeless tobacco. She reports previous alcohol use. She reports that she does not use drugs.    Allergies  Allergen Reactions  . Penicillins Anaphylaxis    Did it involve swelling of the face/tongue/throat, SOB, or low BP? yes Did it involve sudden or severe rash/hives, skin peeling, or any reaction on the inside of your mouth or nose? no Did you need to seek medical attention at a hospital or doctor's office? yes When did it last happen?pt was 22 If all above answers are "NO", may proceed with cephalosporin use.   . Codeine     hyper    Medications Prior to Admission  Medication Sig Dispense Refill  . acetaminophen (TYLENOL) 325 MG tablet Take  2 tablets (650 mg total) by mouth every 4 (four) hours as needed for mild pain (or temp > 37.5 C (99.5 F)).    Marland Kitchen albuterol (PROVENTIL) (2.5 MG/3ML) 0.083% nebulizer solution Inhale 3 mLs (2.5 mg total) into the lungs every 6 (six) hours as needed for wheezing or shortness of breath. 75 mL 12  . arformoterol (BROVANA) 15 MCG/2ML NEBU Take 15 mcg by nebulization every 12 (twelve) hours.    Derrill Memo ON 11/18/2018] aspirin EC 325 MG EC tablet Take 1 tablet (325 mg total) by mouth daily. 30 tablet 0  . cholecalciferol (VITAMIN D3) 25 MCG (1000 UT) tablet Take 1,000 Units by mouth daily.    Marland Kitchen escitalopram (LEXAPRO) 10 MG tablet Take 10 mg by mouth daily.     . fluticasone (FLONASE) 50 MCG/ACT nasal spray Place 1-2 sprays into both nostrils daily as needed for allergies or rhinitis.    Marland Kitchen lansoprazole (PREVACID) 15 MG capsule Take 15 mg by mouth daily at 12 noon.    Marland Kitchen levothyroxine (SYNTHROID, LEVOTHROID) 112 MCG tablet Take 112 mcg by mouth daily before breakfast.    . losartan (COZAAR) 25 MG tablet Take 25 mg by mouth daily.    . metoprolol succinate (TOPROL-XL) 25 MG 24 hr tablet Take 25 mg by mouth daily.    . roflumilast (DALIRESP) 500 MCG TABS tablet Take 500 mcg by mouth daily.     Derrill Memo ON 11/18/2018] senna (SENOKOT) 8.6 MG TABS tablet Take 1 tablet (8.6 mg total) by mouth daily. 120 each 0  . senna-docusate (SENOKOT-S) 8.6-50 MG tablet Take 1 tablet by mouth at bedtime as needed for mild constipation.    . Tiotropium Bromide Monohydrate (SPIRIVA RESPIMAT) 1.25 MCG/ACT AERS Inhale 2 puffs into the lungs daily.      Drug Regimen Review  Drug regimen was reviewed and remains appropriate with no significant issues identified  Home: Home Living Family/patient expects to be discharged to:: Inpatient rehab Living Arrangements: Alone Available Help at Discharge: Family, Available 24 hours/day(daughter and son inlaw will provide 24/7 assist) Type of Home: House Home Access: Stairs to enter Entrance Stairs-Number of Steps: 4 Entrance Stairs-Rails: Right Home Layout: One level Bathroom Shower/Tub: Tub/shower unit, Architectural technologist: Standard Bathroom Accessibility: Yes Home Equipment: Environmental consultant - 2 wheels, [place] - single point  Lives With: Alone   Functional History: Prior Function Level of Independence: Independent Comments: Drives, cooks, cleans. Cares for self.    Functional Status:  Mobility: Bed Mobility Overal bed mobility: Needs Assistance Bed Mobility: Rolling, Sidelying to Sit Rolling: Mod assist Sidelying to sit: Max assist, HOB elevated Sit to sidelying: Mod assist, HOB elevated General bed mobility comments: pt OOB in chair upon arrival Transfers Overall transfer level: Needs assistance Equipment used: Rolling walker (2 wheeled) Transfers: Sit to/from Stand Sit to Stand: Max assist, +2 physical assistance, Mod assist Stand pivot transfers: Max assist, +2 physical assistance General transfer comment: posterior lean with increased time and cues to get hips/trunk over feet.  Ambulation/Gait General Gait Details: Deferred    ADL: ADL Overall ADL's : Needs assistance/impaired Eating/Feeding Details (indicate cue type and reason): min guard while seated EOB due to balance Grooming: Moderate assistance Grooming Details (indicate cue type and reason): supported sitting Upper Body Bathing: Minimal assistance Upper Body Bathing Details (indicate cue type and reason): supported sitting Lower Body Bathing: Total assistance Lower Body Bathing Details (indicate cue type and reason): Max A +2 sit<>stand due to posterior lean Upper Body Dressing : Moderate  assistance Upper Body Dressing Details (indicate cue type and reason): supported sitting Lower Body Dressing: Total assistance Lower Body Dressing Details (indicate cue type and reason): Max A +2 sit<>stand due to posterior lean Toilet Transfer: Maximal assistance, +2 for physical assistance, Stand-pivot Toilet Transfer Details (indicate cue type and reason): bed>recliner going to pt's right Toileting- Clothing Manipulation and Hygiene: Total assistance Toileting - Clothing Manipulation Details (indicate cue type and reason): Max A +2 sit<>stand due to posterior lean  Cognition: Cognition Overall Cognitive Status: Within Functional Limits for tasks assessed Arousal/Alertness: Awake/alert  Orientation Level: Oriented X4 Attention: Focused, Sustained Focused Attention: Appears intact Sustained Attention: Appears intact Memory: Appears intact(Immediate: 3/3; Delayed: 3/3) Awareness: Appears intact Problem Solving: Appears intact(5/5) Executive Function: Reasoning, Sequencing Reasoning: Appears intact(3/3) Sequencing: Appears intact Cognition Arousal/Alertness: Awake/alert Behavior During Therapy: WFL for tasks assessed/performed Overall Cognitive Status: Within Functional Limits for tasks assessed General Comments: Eyes open 1/2 of session, would open them when asked--reports opening eyes does not make her dizzy so not sure whey she is keeping them closed at times   Blood pressure 132/66, pulse 86, temperature 99.3 F (37.4 C), temperature source Oral, resp. rate 20, height 5\' 2"  (1.575 m), weight 59 kg, SpO2 97 %. Physical Exam  Vitals reviewed. Constitutional: She appears well-developed and well-nourished.  HENT:  Head: Normocephalic and atraumatic.  Eyes: EOM are normal. Right eye exhibits no discharge. Left eye exhibits no discharge.  Respiratory: Effort normal. No respiratory distress.  GI: She exhibits no distension.  Musculoskeletal:     Comments: No edema or tenderness in extremities  Neurological: She is alert.  Motor: Right upper extremity/right lower extremity: 4-4+/5 proximal distal Left upper extremity/left lower extremity: 4/5 proximal distal HOH  Skin: Skin is warm and dry.  Psychiatric: She has a normal mood and affect. Her behavior is normal.    No results found for this or any previous visit (from the past 48 hour(s)). Vas [place] Lower Extremity Venous (dvt)  Result Date: 11/16/2018  Lower Venous Study Indications: Embolic stroke.  Performing Technologist: Oliver Hum RVT  Examination Guidelines: A complete evaluation includes B-mode imaging, spectral Doppler, color Doppler, and power Doppler as needed of all accessible portions of each vessel.  Bilateral testing is considered an integral part of a complete examination. Limited examinations for reoccurring indications may be performed as noted.  +---------+---------------+---------+-----------+----------+-------+ RIGHT    CompressibilityPhasicitySpontaneityPropertiesSummary +---------+---------------+---------+-----------+----------+-------+ CFV      Full           Yes      Yes                          +---------+---------------+---------+-----------+----------+-------+ SFJ      Full                                                 +---------+---------------+---------+-----------+----------+-------+ FV Prox  Full                                                 +---------+---------------+---------+-----------+----------+-------+ FV Mid   Full                                                 +---------+---------------+---------+-----------+----------+-------+  FV DistalFull                                                 +---------+---------------+---------+-----------+----------+-------+ PFV      Full                                                 +---------+---------------+---------+-----------+----------+-------+ POP      Full           Yes      Yes                          +---------+---------------+---------+-----------+----------+-------+ PTV      Full                                                 +---------+---------------+---------+-----------+----------+-------+ PERO     Full                                                 +---------+---------------+---------+-----------+----------+-------+   +---------+---------------+---------+-----------+----------+-------+ LEFT     CompressibilityPhasicitySpontaneityPropertiesSummary +---------+---------------+---------+-----------+----------+-------+ CFV      Full           Yes      Yes                          +---------+---------------+---------+-----------+----------+-------+  SFJ      Full                                                 +---------+---------------+---------+-----------+----------+-------+ FV Prox  Full                                                 +---------+---------------+---------+-----------+----------+-------+ FV Mid   Full                                                 +---------+---------------+---------+-----------+----------+-------+ FV DistalFull                                                 +---------+---------------+---------+-----------+----------+-------+ PFV      Full                                                 +---------+---------------+---------+-----------+----------+-------+ POP  Full           Yes      Yes                          +---------+---------------+---------+-----------+----------+-------+ PTV      Full                                                 +---------+---------------+---------+-----------+----------+-------+ PERO     Full                                                 +---------+---------------+---------+-----------+----------+-------+     Summary: Right: There is no evidence of deep vein thrombosis in the lower extremity. No cystic structure found in the popliteal fossa. Left: There is no evidence of deep vein thrombosis in the lower extremity. No cystic structure found in the popliteal fossa.  *See table(s) above for measurements and observations. Electronically signed by Deitra Mayo MD on 11/16/2018 at 6:30:14 AM.    Final        Medical Problem List and Plan: 1.  Weakness, balance deficits with posterior lean secondary to bilateral infarcts as well as right brain bleeds.  Admit to CIR 2.  Antithrombotics: -DVT/anticoagulation:  Mechanical: Sequential compression devices, below knee Bilateral lower extremities  -antiplatelet therapy: ASA 3. Pain Management: Tylenol prn.  4. Mood: LCSW to follow for evaluation and support.   -antipsychotic  agents: N/A 5. Neuropsych: This patient is capable of making decisions on her own behalf. 6. Skin/Wound Care: Routine pressure relief measures.  7. Fluids/Electrolytes/Nutrition: Routine I/Os.  BMP ordered for tomorrow a.m. 8. HTN: Monitor BP bid. Continue to hold Cozaar and Metoprolol---resume medications as indicated.  Monitor with increased mobility. 9.  Dyslipidemia: NO meds due to NASH  10. COPD: Continue Daliresp and Incruse daily.   11. Hypothyroid: On supplement.  12.Transient urinary retention: Monitor for now.  Needed caths for few days per reports.  Will consider PVRs necessary. 13.H/o anemia: has had iron supplement in the past.  CBC ordered.  Post Admission Physician Evaluation: 1. Preadmission assessment reviewed and changes made below. 2. Functional deficits secondary  to bilateral infarcts as well as right brain bleeds. 3. Patient is admitted to receive collaborative, interdisciplinary care between the physiatrist, rehab nursing staff, and therapy team. 4. Patient's level of medical complexity and substantial therapy needs in context of that medical necessity cannot be provided at a lesser intensity of care such as a SNF. 5. Patient has experienced substantial functional loss from his/her baseline which was documented above under the "Functional History" and "Functional Status" headings.  Judging by the patient's diagnosis, physical exam, and functional history, the patient has potential for functional progress which will result in measurable gains while on inpatient rehab.  These gains will be of substantial and practical use upon discharge  in facilitating mobility and self-care at the household level. 6. Physiatrist will provide 24 hour management of medical needs as well as oversight of the therapy plan/treatment and provide guidance as appropriate regarding the interaction of the two. 7. 24 hour rehab nursing will assist with bladder management, safety, skin/wound care, disease  management and patient education  and help integrate  therapy concepts, techniques,education, etc. 8. PT will assess and treat for/with: Lower extremity strength, range of motion, stamina, balance, functional mobility, safety, adaptive techniques and equipment, coping skills, pain control, stroke education. Goals are: Min A. 9. OT will assess and treat for/with: ADL's, functional mobility, safety, upper extremity strength, adaptive techniques and equipment, ego support, and community reintegration.   Goals are: Min a. Therapy may proceed with showering this patient. 10. Case Management and Social Worker will assess and treat for psychological issues and discharge planning. 11. Team conference will be held weekly to assess progress toward goals and to determine barriers to discharge. 12. Patient will receive at least 3 hours of therapy per day at least 5 days per week. 13. ELOS: 13- 17 days.       14. Prognosis:  good  I have personally performed a face to face diagnostic evaluation, including, but not limited to relevant history and physical exam findings, of this patient and developed relevant assessment and plan.  Additionally, I have reviewed and concur with the physician assistant's documentation above.  Delice Lesch, MD, ABPMR Bary Leriche, PA-C 11/17/2018

## 2018-11-17 NOTE — Progress Notes (Signed)
Physical Therapy Treatment Patient Details Name: Stephanie Love MRN: [id number] DOB: 1936-04-19 Today's Date: 11/17/2018    History of Present Illness Patient is a 83 yo female who presents from [place] with NV and dizziness. sp tPA. NIH:3. Head CT-unremarkable. MRI: acute left superior cerebellar artery territory infarct, Punctate acute bilateral parietal and right frontal infarcts, Small volume right parieto-occipital subarachnoid hemorrhage and small right-sided subdural hematoma without mass effect. PMH includes COPD, HTN, NASH.    PT Comments    Patient is making progress toward PT goals. Current plan remains appropriate.    Follow Up Recommendations  CIR;Supervision for mobility/OOB;Supervision/Assistance - 24 hour     Equipment Recommendations  None recommended by PT    Recommendations for Other Services       Precautions / Restrictions Precautions Precautions: Fall Precaution Comments: ataxia Restrictions Weight Bearing Restrictions: No    Mobility  Bed Mobility               General bed mobility comments: pt OOB in chair upon arrival  Transfers Overall transfer level: Needs assistance Equipment used: Rolling walker (2 wheeled) Transfers: Sit to/from Stand Sit to Stand: Max assist;+2 physical assistance;Mod assist         General transfer comment: assist to power up into standing and then to gain balance once in standing with increased assistance  Ambulation/Gait Ambulation/Gait assistance: Max assist;+2 physical assistance Gait Distance (Feet): 30 Feet Assistive device: Rolling walker (2 wheeled);2 person hand held assist(assist at trunk with use of gait belt ) Gait Pattern/deviations: Ataxic;Step-to pattern;Decreased step length - right;Decreased step length - left;Decreased dorsiflexion - right;Decreased dorsiflexion - left Gait velocity: decreased   General Gait Details: assist for weight shifting, balance, and advancing bilat LE at times; pt  with ataxic movements; gait training with +2 HHA and RW attempted; pt with less trunk flexion with use of RW    Stairs             Wheelchair Mobility    Modified Rankin (Stroke Patients Only) Modified Rankin (Stroke Patients Only) Pre-Morbid Rankin Score: No symptoms Modified Rankin: Moderately severe disability     Balance Overall balance assessment: Needs assistance Sitting-balance support: Feet supported;Bilateral upper extremity supported Sitting balance-Leahy Scale: Fair     Standing balance support: Bilateral upper extremity supported Standing balance-Leahy Scale: Zero                              Cognition Arousal/Alertness: Awake/alert Behavior During Therapy: WFL for tasks assessed/performed Overall Cognitive Status: Within Functional Limits for tasks assessed                                        Exercises      General Comments General comments (skin integrity, edema, etc.): pt reports dizziness with any head movement      Pertinent Vitals/Pain Pain Assessment: No/denies pain    Home Living                      Prior Function            PT Goals (current goals can now be found in the care plan section) Progress towards PT goals: Progressing toward goals    Frequency    Min 4X/week      PT Plan Current plan remains appropriate    Co-evaluation  AM-PAC PT "6 Clicks" Mobility   Outcome Measure  Help needed turning from your back to your side while in a flat bed without using bedrails?: A Little Help needed moving from lying on your back to sitting on the side of a flat bed without using bedrails?: A Little Help needed moving to and from a bed to a chair (including a wheelchair)?: A Lot Help needed standing up from a chair using your arms (e.g., wheelchair or bedside chair)?: A Lot Help needed to walk in hospital room?: A Lot Help needed climbing 3-5 steps with a railing? :  Total 6 Click Score: 13    End of Session Equipment Utilized During Treatment: Gait belt Activity Tolerance: Patient tolerated treatment well Patient left: in chair;with call bell/phone within reach;with chair alarm set Nurse Communication: Mobility status PT Visit Diagnosis: Dizziness and giddiness (R42);Unsteadiness on feet (R26.81);Difficulty in walking, not elsewhere classified (R26.2)     Time: 8916-9450 PT Time Calculation (min) (ACUTE ONLY): 24 min  Charges:  $Gait Training: 23-37 mins                     Earney Navy, PTA Acute Rehabilitation Services Pager: [phone] Office: [phone]     Darliss Cheney 11/17/2018, 3:49 PM

## 2018-11-17 NOTE — Plan of Care (Signed)
Adequate for discharge.

## 2018-11-18 ENCOUNTER — Inpatient Hospital Stay (HOSPITAL_COMMUNITY): Payer: Self-pay

## 2018-11-18 ENCOUNTER — Inpatient Hospital Stay (HOSPITAL_COMMUNITY): Payer: Self-pay | Admitting: Occupational Therapy

## 2018-11-18 ENCOUNTER — Inpatient Hospital Stay (HOSPITAL_COMMUNITY): Payer: Self-pay | Admitting: Physical Therapy

## 2018-11-18 DIAGNOSIS — I6340 Cerebral infarction due to embolism of unspecified cerebral artery: Secondary | ICD-10-CM

## 2018-11-18 DIAGNOSIS — D638 Anemia in other chronic diseases classified elsewhere: Secondary | ICD-10-CM

## 2018-11-18 DIAGNOSIS — E8809 Other disorders of plasma-protein metabolism, not elsewhere classified: Secondary | ICD-10-CM

## 2018-11-18 DIAGNOSIS — R7303 Prediabetes: Secondary | ICD-10-CM

## 2018-11-18 DIAGNOSIS — E46 Unspecified protein-calorie malnutrition: Secondary | ICD-10-CM

## 2018-11-18 DIAGNOSIS — I10 Essential (primary) hypertension: Secondary | ICD-10-CM

## 2018-11-18 MED ORDER — PRO-STAT SUGAR FREE PO LIQD
30.0000 mL | Freq: Two times a day (BID) | ORAL | Status: DC
  Administered 2018-11-18 – 2018-11-26 (×9): 30 mL via ORAL
  Filled 2018-11-18 (×18): qty 30

## 2018-11-18 NOTE — Evaluation (Signed)
Occupational Therapy Assessment and Plan  Patient Details  Name: Stephanie Love MRN: [id number] Date of Birth: 1935-10-01  OT Diagnosis: ataxia, hemiplegia affecting non-dominant side and muscle weakness (generalized) Rehab Potential: Rehab Potential (ACUTE ONLY): Good ELOS: 12-14 days   Today's Date: 11/18/2018 OT Individual Time: 1696-7893 OT Individual Time Calculation (min): 75 min     Problem List:  Patient Active Problem List   Diagnosis Date Noted  . Hypoalbuminemia due to protein-calorie malnutrition ([place])   . Prediabetes   . Embolic stroke ([place]) 81/06/7508  . Essential hypertension   . Non-intractable vomiting   . Ataxia   . 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 COPD exacerbation ([place])   . S/P admn tPA in diff fac w/n last 24 hr bef adm to crnt fac   . Anemia of chronic disease   . Urinary retention   . Hypothyroidism   . Chronic obstructive pulmonary disease ([place])   . Dyslipidemia   . Ischemic stroke ([place]) 11/14/2018  . Hyperlipidemia 08/10/2018  . Aftercare following surgery 05/06/2016  . Hammer toe of right foot 09/20/2015    Past Medical History:  Past Medical History:  Diagnosis Date  . Anemia   . Arthritis   . COPD (chronic obstructive pulmonary disease) ([place])   . Diverticulosis   . GERD (gastroesophageal reflux disease)   . Hepatitis A virus infection 1963  . Hypertension   . Hypothyroid   . Microhematuria   . NASH (nonalcoholic steatohepatitis)   . Osteoarthritis   . Stroke ([place]) 11/13/2018  . UTI (urinary tract infection)    Past Surgical History:  Past Surgical History:  Procedure Laterality Date  . ABDOMINAL HYSTERECTOMY  2012  . AMPUTATION TOE     right 2nd toe   . CHOLECYSTECTOMY  2013  . LOOP RECORDER INSERTION N/A 11/16/2018   Procedure: LOOP RECORDER INSERTION;  Surgeon: Thompson Grayer, MD;  Location: [facility];  Service: Cardiovascular;  Laterality: N/A;    Assessment & Plan Clinical  Impression: Stephanie Love is an 83 year old female with history of COPD, NASH, anemia who was evaluated at [facility] for speech difficulty, visual changes and ataxia. CT head negative and she received tPA and was transferred to [facility] on 11/14/2018 from [facility]. History history taken from chart review and patient. CTA at outside hospital without LVO. Echocardiogram with ejection fraction of 60 to 65%. MRI brain reviewed, showing scattered infarcts/bleeds. Per report, acute left superior cerebellar infarct with acute bilateral parietal and right frontal infarcts aiwht small volume right parieto-occipital hemorrhage and small right SDH. Stroke felt to be cardioembolic and Loop recorder placed by Dr. Rayann Heman. ASA added for secondary stroke prevention. No plans for TEE. Therapy evaluations completed revealing balance deficits with posterior lean and tendency to keep eyes closed with mobility. CIR recommended due to functional decline.  Patient transferred to CIR on 11/17/2018 .    Patient currently requires mod with basic self-care skills transfers and balance secondary to decreased cardiorespiratoy endurance, ataxia and decreased coordination, central origin and decreased sitting balance, decreased standing balance, decreased postural control, hemiplegia and decreased balance strategies.  Prior to hospitalization, patient was very independent and active.  Patient will benefit from skilled intervention to increase independence with basic self-care skills prior to discharge home with care partner.  Anticipate patient will require 24 hour supervision and follow up home health.  OT - End of Session Activity Tolerance: Tolerates 10 - 20 min activity with multiple rests  Endurance Deficit: Yes Endurance Deficit Description: focused on relaxed breathing, pt has some anxiety about falling OT Assessment Rehab Potential (ACUTE ONLY): Good OT Patient demonstrates impairments in the following area(s): Balance;Endurance;Motor OT Basic  ADL's Functional Problem(s): Bathing;Dressing;Toileting OT Transfers Functional Problem(s): Toilet;Tub/Shower OT Plan OT Intensity: Minimum of 1-2 x/day, 45 to 90 minutes OT Frequency: 5 out of 7 days OT Duration/Estimated Length of Stay: 12-14 days OT Treatment/Interventions: Balance/vestibular training;Discharge planning;Disease Lawyer;Functional mobility training;Psychosocial support;Patient/family education;Neuromuscular re-education;Self Care/advanced ADL retraining;Therapeutic Activities;UE/LE Coordination activities;UE/LE Strength taining/ROM;Therapeutic Exercise OT Self Feeding Anticipated Outcome(s): Independent OT Basic Self-Care Anticipated Outcome(s): S overall OT Toileting Anticipated Outcome(s): S  OT Bathroom Transfers Anticipated Outcome(s): S OT Recommendation Patient destination: Home Follow Up Recommendations: Home health OT Equipment Recommended: Tub/shower bench;Tub/shower seat Equipment Details: TBD seat or bench   Skilled Therapeutic Intervention Pt seen for initial evaluation and ADL retraining with a focus on balance and coordination. Explained role of OT, discussed pt's goals, and reviewed POC.  Pt stated she was so nervous to move because she was certain she would fall over or get too dizzy. Worked on relaxed breathing and visually focusing on an object as she was changing positions.  Pt actually did quite well today and even surprised herself.  She needed the most A with sit to stand (min),  stand pivot transfers and standing balance (mod A) but could bathe and don her clothing without A.  Pt did not get above a 3/10 of dizziness.  Pt bathed at sink today and did not need to toilet this session.  She does have some LUE ataxia but was able to open containers and fasten her bra.  The ataxia is more pronounced when her arm is extended.  Pt opted to lay down to rest, bed alarm set and all needs met.   OT  Evaluation Precautions/Restrictions  Precautions Precautions: Fall Precaution Comments: ataxia Restrictions Weight Bearing Restrictions: No    Vital Signs Oxygen Therapy SpO2: 96 % O2 Device: Room Air Pain Pain Assessment Pain Score: 0-No pain Home Living/Prior Functioning Home Living Family/patient expects to be discharged to:: Private residence Living Arrangements: Alone Available Help at Discharge: Family, Available 24 hours/day Type of Home: House Home Access: Stairs to enter [employer] of Steps: 4 Entrance Stairs-Rails: Right Home Layout: One level Bathroom Shower/Tub: Tub/shower unit, Architectural technologist: Standard Bathroom Accessibility: Yes  Lives With: Alone Prior Function Comments: Drives, cooks, cleans. Cares for self.  ADL ADL ADL Comments: Pt able to bathe and dress herself but needs mod A with transfers and standing balance. Vision Baseline Vision/History: No visual deficits Patient Visual Report: No change from baseline Vision Assessment?: Yes;No apparent visual deficits Eye Alignment: Within Functional Limits Ocular Range of Motion: Within Functional Limits Tracking/Visual Pursuits: Able to track stimulus in all quads without difficulty Visual Fields: No apparent deficits Perception  Perception: Within Functional Limits Praxis Praxis: Intact Cognition Overall Cognitive Status: Within Functional Limits for tasks assessed Arousal/Alertness: Awake/alert Orientation Level: Person;Place;Situation Person: Oriented Place: Oriented Situation: Oriented Year: 2020 Month: May Day of Week: Correct Memory: Appears intact Immediate Memory Recall: Sock;Blue;Bed Memory Recall: Sock;Blue;Bed Memory Recall Sock: Without Cue Memory Recall Blue: Without Cue Memory Recall Bed: Without Cue Awareness: Appears intact Safety/Judgment: Appears intact Sensation Sensation Light Touch: Appears Intact Hot/Cold: Appears Intact Proprioception:  Impaired by gross assessment Stereognosis: Appears Intact Additional Comments: decreased LE proprioception Coordination Gross Motor Movements are Fluid and Coordinated: No Fine Motor Movements are Fluid and Coordinated: No Coordination and Movement  Description: LUE/LLE ataxia Finger Nose Finger Test: pt can complete with slight dysmetria  Motor  Motor Motor: Ataxia Mobility    mod A stand pivot transfers Trunk/Postural Assessment  Cervical Assessment Cervical Assessment: Within Functional Limits Thoracic Assessment Thoracic Assessment: Within Functional Limits Lumbar Assessment Lumbar Assessment: Within Functional Limits Postural Control Postural Control: Deficits on evaluation Trunk Control: posterior lean fluctuates with anterior trunk flexion in standing, pt responds well to cue to bring hips forward when she pushes them back too far or to bring shoulders forward when she leans back.  Can sit unsupported with S.   Balance Static Sitting Balance Static Sitting - Level of Assistance: 5: Stand by assistance Dynamic Sitting Balance Dynamic Sitting - Level of Assistance: 4: Min assist Static Standing Balance Static Standing - Level of Assistance: 3: Mod assist Dynamic Standing Balance Dynamic Standing - Level of Assistance: 3: Mod assist Extremity/Trunk Assessment RUE Assessment RUE Assessment: Within Functional Limits LUE Assessment Passive Range of Motion (PROM) Comments: WFL Active Range of Motion (AROM) Comments: WFL General Strength Comments: 4/5, ataxic movements, but able to fasten her bra, open containers, don clothing with LUE     Refer to Care Plan for Long Term Goals  Recommendations for other services: None    Discharge Criteria: Patient will be discharged from OT if patient refuses treatment 3 consecutive times without medical reason, if treatment goals not met, if there is a change in medical status, if patient makes no progress towards goals or if patient  is discharged from hospital.  The above assessment, treatment plan, treatment alternatives and goals were discussed and mutually agreed upon: by patient  [facility] 11/18/2018, 12:24 PM

## 2018-11-18 NOTE — Progress Notes (Signed)
[facility] PHYSICAL MEDICINE & REHABILITATION PROGRESS NOTE  Subjective/Complaints: Patient seen laying in bed this morning.  She states she slept well overnight.  She states she is ready begin therapies today.  ROS: Denies CP, shortness of breath, nausea, vomiting, diarrhea.  Objective: Vital Signs: Blood pressure 122/64, pulse 96, temperature 98.3 F (36.8 C), temperature source Oral, resp. rate 18, height 5\' 2"  (1.575 m), weight 59 kg, SpO2 96 %. No results found. Recent Labs    11/17/18 1438  WBC 5.8  HGB 7.0*  HCT 24.7*  PLT 213   Recent Labs    11/17/18 1438  NA 135  K 3.5  CL 105  CO2 22  GLUCOSE 162*  BUN 17  CREATININE 0.88  CALCIUM 9.2    Physical Exam: BP 122/64 (BP Location: Left Leg)   Pulse 96   Temp 98.3 F (36.8 C) (Oral)   Resp 18   Ht 5\' 2"  (1.575 m)   Wt 59 kg   SpO2 96%   BMI 23.79 kg/m  Constitutional: No distress . Vital signs reviewed. HENT: Normocephalic.  Atraumatic. Eyes: EOMI. No discharge. Cardiovascular: No JVD. Respiratory: Normal effort. GI: Non-distended. Musc: No edema or tenderness in extremities. Neurological: She is alert and oriented. Motor: Right upper extremity/right lower extremity: 4-4+/5 proximal distal Left upper extremity/left lower extremity: 4/5 proximal distal HOH Skin: Skin is warm and dry.  Psychiatric: She has a normal mood and affect. Her behavior is normal.   Assessment/Plan: 1. Functional deficits secondary to bilateral brain infarcts as well as right brain bleeds which require 3+ hours per day of interdisciplinary therapy in a comprehensive inpatient rehab setting.  Physiatrist is providing close team supervision and 24 hour management of active medical problems listed below.  Physiatrist and rehab team continue to assess barriers to discharge/monitor patient progress toward functional and medical goals  Care Tool:  Bathing              Bathing assist       Upper Body  Dressing/Undressing Upper body dressing        Upper body assist      Lower Body Dressing/Undressing Lower body dressing            Lower body assist       Toileting Toileting    Toileting assist       Transfers Chair/bed transfer  Transfers assist           Locomotion Ambulation   Ambulation assist              Walk 10 feet activity   Assist           Walk 50 feet activity   Assist           Walk 150 feet activity   Assist           Walk 10 feet on uneven surface  activity   Assist           Wheelchair     Assist               Wheelchair 50 feet with 2 turns activity    Assist            Wheelchair 150 feet activity     Assist            Medical Problem List and Plan: 1.  Weakness, balance deficits with posterior lean secondary to bilateral infarcts as well as right brain bleeds.  Begin CIR  evaluations 2.  Antithrombotics: -DVT/anticoagulation:  Mechanical: Sequential compression devices, below knee Bilateral lower extremities             -antiplatelet therapy: ASA 3. Pain Management: Tylenol prn.  4. Mood: LCSW to follow for evaluation and support.              -antipsychotic agents: N/A 5. Neuropsych: This patient is capable of making decisions on her own behalf. 6. Skin/Wound Care: Routine pressure relief measures.  7. Fluids/Electrolytes/Nutrition: Routine I/Os.    BMP within acceptable range on 5/27 8. HTN: Monitor BP bid. Cozaar and Metoprolol---resume medications as indicated.    Monitor with increased mobility 9.  Dyslipidemia: No meds due to NASH  10. COPD: Continue Daliresp and Incruse daily.   11. Hypothyroid: On supplement.  12.Transient urinary retention: Monitor for now.  Needed caths for few days per reports.    PVRs ordered 13.H/o anemia: has had iron supplement in the past.    Hemoglobin 7.0 on 5/27, labs ordered for tomorrow  Continue to monitor 14.   Prediabetes  Continue to monitor 15.  Hypoalbuminemia  Supplement initiated on 5/28  LOS: 1 days A FACE TO FACE EVALUATION WAS PERFORMED  Laureano Hetzer Lorie Phenix 11/18/2018, 9:49 AM

## 2018-11-18 NOTE — Evaluation (Signed)
Physical Therapy Assessment and Plan  Patient Details  Name: Stephanie Love MRN: [id number] Date of Birth: [birth year]  PT Diagnosis: Abnormal posture, Abnormality of gait, Ataxia, Coordination disorder and Hemiparesis non-dominant Rehab Potential: Good ELOS: 12-14 days   Today's Date: 11/18/2018 PT Individual Time: 6283-6629 PT Individual Time Calculation (min): 75 min    Problem List:  Patient Active Problem List   Diagnosis Date Noted  . Hypoalbuminemia due to protein-calorie malnutrition ([place])   . Prediabetes   . Embolic stroke ([place]) 47/65/4650  . Essential hypertension   . Non-intractable vomiting   . Ataxia   . 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 COPD exacerbation ([place])   . S/P admn tPA in diff fac w/n last 24 hr bef adm to crnt fac   . Anemia of chronic disease   . Urinary retention   . Hypothyroidism   . Chronic obstructive pulmonary disease ([place])   . Dyslipidemia   . Ischemic stroke ([place]) 11/14/2018  . Hyperlipidemia 08/10/2018  . Aftercare following surgery 05/06/2016  . Hammer toe of right foot 09/20/2015    Past Medical History:  Past Medical History:  Diagnosis Date  . Anemia   . Arthritis   . COPD (chronic obstructive pulmonary disease) ([place])   . Diverticulosis   . GERD (gastroesophageal reflux disease)   . Hepatitis A virus infection 1963  . Hypertension   . Hypothyroid   . Microhematuria   . NASH (nonalcoholic steatohepatitis)   . Osteoarthritis   . Stroke ([place]) 11/13/2018  . UTI (urinary tract infection)    Past Surgical History:  Past Surgical History:  Procedure Laterality Date  . ABDOMINAL HYSTERECTOMY  2012  . AMPUTATION TOE     right 2nd toe   . CHOLECYSTECTOMY  2013  . LOOP RECORDER INSERTION N/A 11/16/2018   Procedure: LOOP RECORDER INSERTION;  Surgeon: Thompson Grayer, MD;  Location: [facility];  Service: Cardiovascular;  Laterality: N/A;    Assessment & Plan Clinical Impression: Stephanie Love is  an 83 year old female with history of COPD, NASH, anemia who was evaluated at [facility] for speech difficulty, visual changes and ataxia. CT head negative and she received tPA and was transferred to [facility] on 11/14/2018 from [facility].  History history taken from chart review and patient.  CTA at outside hospital without LVO.  Echocardiogram with ejection fraction of 60 to 65%.  MRI brain reviewed, showing scattered infarcts/bleeds.  Per report, acute left superior cerebellar infarct with acute bilateral parietal and right frontal infarcts with small volume right parieto-occipital hemorrhage and small right SDH.   Stroke felt to be cardioembolic and Loop recorder  placed by Dr. Rayann Heman.  ASA added for secondary stroke prevention.  No plans for TEE. Therapy evaluations completed revealing balance deficits with posterior lean and tendency to keep eyes closed with mobility. CIR recommended due to functional decline.   Patient transferred to CIR on 11/17/2018 .   Patient currently requires mod with mobility secondary to unbalanced muscle activation, ataxia and decreased coordination and central origin.  Prior to hospitalization, patient was independent  with mobility and lived with Alone in a House(duplex) home.  Home access is 4-5 in front; back deck steps 8-10 with L rail Stairs to enter.  Patient will benefit from skilled PT intervention to maximize safe functional mobility, minimize fall risk and decrease caregiver burden for planned discharge home with 24 hour supervision.  Anticipate patient will benefit from follow up [place] at  discharge.  PT - End of Session Activity Tolerance: Improving;Tolerates 30+ min activity with multiple rests Endurance Deficit: Yes Endurance Deficit Description: fatigues wtih c/o head heaviness and neck pain/stiffness with upright activities PT Assessment Rehab Potential (ACUTE/IP ONLY): Good PT Barriers to Discharge: Decreased caregiver support PT Barriers to Discharge Comments: Family can provide  assistance at d/c, unsure if ongoing PT Patient demonstrates impairments in the following area(s): Balance;Endurance;Motor;Sensory;Safety PT Transfers Functional Problem(s): Bed Mobility;Bed to Chair;Car;Furniture;Floor PT Locomotion Functional Problem(s): Ambulation;Wheelchair Mobility;Stairs PT Plan PT Intensity: Minimum of 1-2 x/day ,45 to 90 minutes PT Frequency: 5 out of 7 days PT Duration Estimated Length of Stay: 12-14 days PT Treatment/Interventions: Ambulation/gait training;Functional mobility training;Therapeutic Activities;Wheelchair propulsion/positioning;Therapeutic Exercise;Neuromuscular re-education;Balance/vestibular training;DME/adaptive equipment instruction;UE/LE Strength taining/ROM;UE/LE Coordination activities;Stair training;Patient/family education PT Transfers Anticipated Outcome(s): S PT Locomotion Anticipated Outcome(s): S PT Recommendation Recommendations for Other Services: Vestibular eval Follow Up Recommendations: Home health PT Patient destination: Home Equipment Recommended: To be determined Equipment Details: reports has RW at home  Skilled Therapeutic Intervention Patient in supine and agreeable to PT.  Reports history of event as well as prior functional level and home environment.  Patient performed supine to sit with min A and seated EOB for awhile to accommodate to upright and cues for visual fixation to reduce symptoms of imbalance.  Noted R lateral head tilt and tendency to lean posterior in sitting.  Patient sit to stand to RW mod A for lifting help with increased time.  Ambulated to/from bathroom with RW and min to mod A for balance, walker safety and cues to negotiate threshold into bathroom.  Toileted with min to mod A for balance while pulling up pants.  Patient performed stand pivot to w/c mod A cues for hand placement.  Propelled in hallway with occasional min A mostly S with UE's x about 70'.  Negotiated 2 steps with rails and mod A cues for  sequence, safety and assist to place L foot onto step.  Patient assisted in w/c to room, seated for visual/vestibular assessment/screen.  Noted jerky smooth pursuits, intact but slowed saccades and slowed and difficulty with target maintenance with VOR due to focus and head tipped to R.  Patient assisted to supine with min A for positioning and left with call bell in reach and bed alarm activated.   PT Evaluation Precautions/Restrictions Precautions Precautions: Fall Precaution Comments: ataxia Pain Pain Assessment Pain Score: 0-No pain Home Living/Prior Functioning Home Living Available Help at Discharge: Family;Available 24 hours/day(daughter) Type of Home: House(duplex) Home Access: Stairs to enter Entrance Stairs-Number of Steps: 4-5 in front; back deck steps 8-10 with L rail  Entrance Stairs-Rails: Right;Left;Can reach both Home Layout: One level Bathroom Shower/Tub: Product/process development scientist: Standard  Lives With: Alone Prior Function Level of Independence: Independent with basic ADLs;Independent with gait;Independent with transfers  Able to Take Stairs?: Yes Driving: Yes Leisure: Hobbies-yes (Comment)(reading, used to crochet and garden) Comments: Drives, cooks, Microbiologist. Cares for self. hasn't been grocery shopping due to COVID-19 Vision/Perception     Cognition Overall Cognitive Status: Within Functional Limits for tasks assessed Arousal/Alertness: Awake/alert Orientation Level: Oriented X4 Sustained Attention: Appears intact Memory: Appears intact Safety/Judgment: Appears intact Sensation Sensation Light Touch: Appears Intact Proprioception: Impaired by gross assessment Additional Comments: decreased LE proprioception Coordination Coordination and Movement Description: LUE/LLE ataxia Heel Shin Test: jerky on L Motor  Motor Motor: Hemiplegia;Ataxia;Abnormal postural alignment and control Motor - Skilled Clinical Observations: weakness on L > ,  decreased coordination on L, head tilted to R  Mobility Bed Mobility Bed Mobility: Rolling Right;Supine to Sit Rolling Right: Supervision/verbal cueing Supine to Sit: Minimal Assistance - Patient > 75% Transfers Transfers: Sit to Stand;Stand to Sit;Stand Pivot Transfers Sit to Stand: Moderate Assistance - Patient 50-74% Stand to Sit: Minimal Assistance - Patient > 75% Stand Pivot Transfers: Moderate Assistance - Patient 50 - 74% Stand Pivot Transfer Details: Verbal cues for technique;Verbal cues for sequencing Transfer (Assistive device): Rolling walker Locomotion  Gait Ambulation: Yes Gait Assistance: Moderate Assistance - Patient 50-74% Gait Distance (Feet): 15 Feet Assistive device: Rolling walker Gait Assistance Details: Verbal cues for safe use of DME/AE;Verbal cues for technique;Verbal cues for precautions/safety Gait Gait: Yes Gait Pattern: Wide base of support;Shuffle;Decreased stride length Stairs / Additional Locomotion Stairs: Yes Stairs Assistance: Moderate Assistance - Patient 50 - 74% Stair Management Technique: Step to pattern;Forwards;Two rails Number of Stairs: 2 Height of Stairs: 6 Wheelchair Mobility Wheelchair Mobility: Yes Wheelchair Assistance: Minimal assistance - Patient >75% Wheelchair Propulsion: Both upper extremities Wheelchair Parts Management: Needs assistance Distance: 22'  Trunk/Postural Assessment  Cervical Assessment Cervical Assessment: Exceptions to WFL(lateral cervical flexion to R) Thoracic Assessment Thoracic Assessment: Within Functional Limits Lumbar Assessment Lumbar Assessment: Within Functional Limits Postural Control Postural Control: Deficits on evaluation Trunk Control: difficulty maintaining head over hips in sitting, leans back and grips EOB with both hands Protective Responses: delayed Postural Limitations: R lateral cervical flexion pt unaware  Balance Static Sitting Balance Static Sitting - Level of Assistance: 5:  Stand by assistance(with bilateral UE support) Dynamic Sitting Balance Dynamic Sitting - Level of Assistance: 4: Min assist Static Standing Balance Static Standing - Level of Assistance: 4: Min assist Dynamic Standing Balance Dynamic Standing - Level of Assistance: 3: Mod assist Extremity Assessment      RLE Assessment Active Range of Motion (AROM) Comments: WFL General Strength Comments: hip flexion 4-/5, knee extension 4+/5 LLE Assessment LLE Assessment: Exceptions to [facility] Active Range of Motion (AROM) Comments: WFL General Strength Comments: hip flexion 3/5, knee extension 4-/5 ankle DF 3+/5    Refer to Care Plan for Long Term Goals  Recommendations for other services: None   Discharge Criteria: Patient will be discharged from PT if patient refuses treatment 3 consecutive times without medical reason, if treatment goals not met, if there is a change in medical status, if patient makes no progress towards goals or if patient is discharged from hospital.  The above assessment, treatment plan, treatment alternatives and goals were discussed and mutually agreed upon: by patient  Reginia Naas 11/18/2018, 4:35 PM  Magda Kiel, PT

## 2018-11-18 NOTE — Progress Notes (Signed)
Inpatient Rehabilitation  Patient information reviewed and entered into eRehab system by Clete Kuch M. Kendarrius Tanzi, M.A., CCC/SLP, PPS Coordinator.  Information including medical coding, functional ability and quality indicators will be reviewed and updated through discharge.    During the Covid 19 public health emergency, the inpatient rehabilitation unit of the [facility] will use acute care beds on another unit to provide each patient with a private room. This effort is to assure proper infection control and to optimize care management during this public health emergency.     

## 2018-11-18 NOTE — Progress Notes (Signed)
Physical Therapy Session Note  Patient Details  Name: Stephanie Love MRN: [id number] Date of Birth: 27-May-1936  Today's Date: 11/18/2018 PT Individual Time: 1645-1730 PT Individual Time Calculation (min): 45 min   Short Term Goals: Week 1:  PT Short Term Goal 1 (Week 1): Patient to perform transfers consistently min A. PT Short Term Goal 2 (Week 1): Patient to ambulate 95' with LRAD and min A PT Short Term Goal 3 (Week 1): Patient to demonstrate standing balance wtih 1 UE support and S for functional task PT Short Term Goal 4 (Week 1): Patient to negotiate stairs x 4 with min A with rails  Skilled Therapeutic Interventions/Progress Updates:   Pt received supine in bed and agreeable to PT. Supine>sit transfer with CGA assist and min cues for reciprocal scooting to EOB. Sitting balance EOB with supervision assist and BUE support on mattress.    Stand pivot transfer to [facility] with mod assist due to ataxia on the LLE. Sit<>stand transfer training from [facility] with min assist from PT and moderate cues for anterior weight shift and UE placement. Standing balance training performed with min assist from PT with 1 UE support x 30 sec Bil and no UE support x 15 sec. Semi tandem stance x 15 Bil with BUE support on RW.   Gait training with RW 2 x 113f with min assist from PT for improved posture, midline orientation and cues for improved step length on the LLE. Patient returned to room and left sitting in recliner with call bell in reach and all needs met.      Therapy Documentation Precautions:  Precautions Precautions: Fall Precaution Comments: ataxia Restrictions Weight Bearing Restrictions: No Vital Signs: Therapy Vitals Temp: 98.4 F (36.9 C) Temp Source: Oral Pulse Rate: 96 Resp: 18 BP: (!) 121/57 Patient Position (if appropriate): Lying Oxygen Therapy SpO2: 97 % O2 Device: Room Air Pain: denies   Therapy/Group: Individual Therapy  ALorie Phenix5/28/2020, 5:52 PM

## 2018-11-18 NOTE — Progress Notes (Signed)
Social Work  Social Work Assessment and Plan  Patient Details  Name: Stephanie Love MRN: [id number] Date of Birth: 09/02/1935  Today's Date: 11/18/2018  Problem List:  Patient Active Problem List   Diagnosis Date Noted  . Hypoalbuminemia due to protein-calorie malnutrition ([place])   . Prediabetes   . Embolic stroke ([place]) 82/99/3716  . Essential hypertension   . Non-intractable vomiting   . Ataxia   . 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 COPD exacerbation ([place])   . S/P admn tPA in diff fac w/n last 24 hr bef adm to crnt fac   . Anemia of chronic disease   . Urinary retention   . Hypothyroidism   . Chronic obstructive pulmonary disease ([place])   . Dyslipidemia   . Ischemic stroke ([place]) 11/14/2018  . Hyperlipidemia 08/10/2018  . Aftercare following surgery 05/06/2016  . Hammer toe of right foot 09/20/2015   Past Medical History:  Past Medical History:  Diagnosis Date  . Anemia   . Arthritis   . COPD (chronic obstructive pulmonary disease) ([place])   . Diverticulosis   . GERD (gastroesophageal reflux disease)   . Hepatitis A virus infection 1963  . Hypertension   . Hypothyroid   . Microhematuria   . NASH (nonalcoholic steatohepatitis)   . Osteoarthritis   . Stroke ([place]) 11/13/2018  . UTI (urinary tract infection)    Past Surgical History:  Past Surgical History:  Procedure Laterality Date  . ABDOMINAL HYSTERECTOMY  2012  . AMPUTATION TOE     right 2nd toe   . CHOLECYSTECTOMY  2013  . LOOP RECORDER INSERTION N/A 11/16/2018   Procedure: LOOP RECORDER INSERTION;  Surgeon: Thompson Grayer, MD;  Location: [facility];  Service: Cardiovascular;  Laterality: N/A;   Social History:  reports that she quit smoking about 37 years ago. Her smoking use included cigarettes. She has never used smokeless tobacco. She reports previous alcohol use. She reports that she does not use drugs.  Family / Support Systems Marital Status: Widow/Widower Patient  Roles: Parent, Caregiver Children: Chartered loss adjuster [phone] Other Supports: Two son's out state-extended family Anticipated Caregiver: Daughter, son in-law and granddaughter Ability/Limitations of Caregiver: All work but can work on a Probation officer: Other (Comment)(Work on a plan depending upon what pt's needs) Family Dynamics: Close knit family who rely upon one another. Pt has always taken care of her family and extended family. She has friends and church members who are involved.  Social History Preferred language: English Religion:  Cultural Background: No issues Education: HS Read: Yes Write: Yes Employment Status: Retired Public relations account executive Issues: No issues Guardian/Conservator: None-according to MD pt is capable of making her own decisions while here   Abuse/Neglect Abuse/Neglect Assessment Can Be Completed: Yes Physical Abuse: Denies Verbal Abuse: Denies Sexual Abuse: Denies Exploitation of patient/patient's resources: Denies Self-Neglect: Denies  Emotional Status Pt's affect, behavior and adjustment status: Pt is motivated to do well and has always been the one to take care of others and not the one who needed care. She is hopeful she will do well here and be able to take care of herself by the time she leaves here. Recent Psychosocial Issues: other health issues were managed by PCP Psychiatric History: No history deferred depression screen due to has a strong faith and feels can accomplish anything if she puts her mind to it. She is doing well and has a good attitude regarding her recovery. Substance Abuse History:  Quit tobacco-years ago no other issues  Patient / Family Perceptions, Expectations & Goals Pt/Family understanding of illness & functional limitations: Pt talks with the MD and feels she has a good understanding of her stroke and deficits. She feels she is not moving well yet but will get there. She is not used to being in a  hospital due to has been well but will make the best of it.  Premorbid pt/family roles/activities: Mom, grandmother, caregiver, retiree, church member, etc Anticipated changes in roles/activities/participation: resume Pt/family expectations/goals: Pt states: " I want to be able to do for myself before I leave here." Daughter states; " I hope she does wll she doesn't like asking others for help and probably won't."  [employer]: None Premorbid Home Care/DME Agencies: Other (Comment)(has rw and cane) Transportation available at discharge: family-pt drove prior to admission Resource referrals recommended: Support group (specify)  Discharge Planning Living Arrangements: Alone Support Systems: Children, Other relatives, Friends/neighbors, Church/faith community Type of Residence: Private residence Insurance Resources: [employer] Financial Resources: [place] Referred: No Living Expenses: Own Money Management: Patient, Family Does the patient have any problems obtaining your medications?: No Home Management: Self Patient/Family Preliminary Plans: Return home with her family coming in to assist, they are waiting to see the progres she makes due to all of them work. They will need to come up with a plan for discharge if 24 hr care is needed. Will await therpay evaluation and work on a safe plan for her. Social Work Anticipated Follow Up Needs: HH/OP, Support Group  Clinical Impression Pleasant talkative pt who is motivated to do well and recover from her stroke. She has always taken care of others and been the caregiver not the one needing care. She has a involved family who will pul together and will provide what care she will need. Will await therapy evaluations and work on discharge needs.  Elease Hashimoto 11/18/2018, 12:02 PM

## 2018-11-18 NOTE — Care Management Note (Signed)
Inpatient [facility] Individual Statement of Services  Patient Name:  Stephanie Love  Date:  11/18/2018  Welcome to the [facility].  Our goal is to provide you with an individualized program based on your diagnosis and situation, designed to meet your specific needs.  With this comprehensive rehabilitation program, you will be expected to participate in at least 3 hours of rehabilitation therapies Monday-Friday, with modified therapy programming on the weekends.  Your rehabilitation program will include the following services:  Physical Therapy (PT), Occupational Therapy (OT), 24 hour per day rehabilitation nursing, Case Management (Social Worker), Rehabilitation Medicine, Nutrition Services and Pharmacy Services  Weekly team conferences will be held on Wednesday to discuss your progress.  Your Social Worker will talk with you frequently to get your input and to update you on team discussions.  Team conferences with you and your family in attendance may also be held.  Expected length of stay: 12-14 days  Overall anticipated outcome: supervision with cueing  Depending on your progress and recovery, your program may change. Your Social Worker will coordinate services and will keep you informed of any changes. Your Social Worker's name and contact numbers are listed  below.  The following services may also be recommended but are not provided by the [facility] [place] will be made to provide these services after discharge if needed.  Arrangements include referral to agencies that provide these services.  Your insurance has been verified to be:  Medicare Your primary doctor is:  Marco Collie  Pertinent information will be shared with your doctor and your insurance company.  Social Worker:  Ovidio Kin, [place] or ([phone]  Information discussed with and copy given to patient by: Elease Hashimoto, 11/18/2018, 12:04 PM

## 2018-11-19 ENCOUNTER — Inpatient Hospital Stay (HOSPITAL_COMMUNITY): Payer: Medicare Other

## 2018-11-19 ENCOUNTER — Inpatient Hospital Stay (HOSPITAL_COMMUNITY): Payer: Medicare Other | Admitting: Occupational Therapy

## 2018-11-19 DIAGNOSIS — R339 Retention of urine, unspecified: Secondary | ICD-10-CM

## 2018-11-19 DIAGNOSIS — I129 Hypertensive chronic kidney disease with stage 1 through stage 4 chronic kidney disease, or unspecified chronic kidney disease: Secondary | ICD-10-CM | POA: Diagnosis not present

## 2018-11-19 DIAGNOSIS — J449 Chronic obstructive pulmonary disease, unspecified: Secondary | ICD-10-CM | POA: Diagnosis not present

## 2018-11-19 DIAGNOSIS — N182 Chronic kidney disease, stage 2 (mild): Secondary | ICD-10-CM | POA: Diagnosis not present

## 2018-11-19 DIAGNOSIS — E785 Hyperlipidemia, unspecified: Secondary | ICD-10-CM | POA: Diagnosis not present

## 2018-11-19 LAB — CBC
HCT: 25.4 % — ABNORMAL LOW (ref 36.0–46.0)
Hemoglobin: 7.2 g/dL — ABNORMAL LOW (ref 12.0–15.0)
MCH: 19 pg — ABNORMAL LOW (ref 26.0–34.0)
MCHC: 28.3 g/dL — ABNORMAL LOW (ref 30.0–36.0)
MCV: 67.2 fL — ABNORMAL LOW (ref 80.0–100.0)
Platelets: 223 10*3/uL (ref 150–400)
RBC: 3.78 MIL/uL — ABNORMAL LOW (ref 3.87–5.11)
RDW: 16.7 % — ABNORMAL HIGH (ref 11.5–15.5)
WBC: 5.6 10*3/uL (ref 4.0–10.5)
nRBC: 0 % (ref 0.0–0.2)

## 2018-11-19 LAB — GLUCOSE, CAPILLARY
Glucose-Capillary: 96 mg/dL (ref 70–99)
Glucose-Capillary: 120 mg/dL — ABNORMAL HIGH (ref 70–99)
Glucose-Capillary: 104 mg/dL — ABNORMAL HIGH (ref 70–99)

## 2018-11-19 NOTE — Progress Notes (Signed)
[facility] PHYSICAL MEDICINE & REHABILITATION PROGRESS NOTE  Subjective/Complaints: Patient seen sitting up in a chair working with therapy this morning.  States she slept well overnight.  She states she had a good first day of therapies yesterday.  Discussed with PA, appears to have chronic anemia.  ROS: Denies CP, shortness of breath, nausea, vomiting, diarrhea.  Objective: Vital Signs: Blood pressure 138/65, pulse 87, temperature 98.3 F (36.8 C), temperature source Oral, resp. rate 18, height 5\' 2"  (1.575 m), weight 59 kg, SpO2 99 %. No results found. Recent Labs    11/17/18 1438 11/19/18 0429  WBC 5.8 5.6  HGB 7.0* 7.2*  HCT 24.7* 25.4*  PLT 213 223   Recent Labs    11/17/18 1438  NA 135  K 3.5  CL 105  CO2 22  GLUCOSE 162*  BUN 17  CREATININE 0.88  CALCIUM 9.2    Physical Exam: BP 138/65 (BP Location: Left Arm)   Pulse 87   Temp 98.3 F (36.8 C) (Oral)   Resp 18   Ht 5\' 2"  (1.575 m)   Wt 59 kg   SpO2 99%   BMI 23.79 kg/m  Constitutional: No distress . Vital signs reviewed. HENT: Normocephalic.  Atraumatic. Eyes: EOMI.  No discharge. Cardiovascular: No JVD. Respiratory: Normal effort. GI: Non-distended. Musc: No edema or tenderness in extremities. Neurological: She is alert and oriented. Motor: Right upper extremity/right lower extremity: 5/5 proximal distal Left upper extremity/left lower extremity: 4-4+/5 proximal distal HOH Skin: Skin is warm and dry.  Psychiatric: She has a normal mood and affect. Her behavior is normal.   Assessment/Plan: 1. Functional deficits secondary to bilateral brain infarcts as well as right brain bleeds which require 3+ hours per day of interdisciplinary therapy in a comprehensive inpatient rehab setting.  Physiatrist is providing close team supervision and 24 hour management of active medical problems listed below.  Physiatrist and rehab team continue to assess barriers to discharge/monitor patient progress toward  functional and medical goals  Care Tool:  Bathing    Body parts bathed by patient: Right arm, Left arm, Chest, Abdomen, Front perineal area, Buttocks, Right upper leg, Left upper leg, Right lower leg, Left lower leg, Face         Bathing assist Assist Level: Moderate Assistance - Patient 50 - 74%(to balance in standing)     Upper Body Dressing/Undressing Upper body dressing   What is the patient wearing?: Bra, Pull over shirt    Upper body assist Assist Level: Set up assist    Lower Body Dressing/Undressing Lower body dressing      What is the patient wearing?: Underwear/pull up, Pants     Lower body assist Assist for lower body dressing: Moderate Assistance - Patient 50 - 74%(mod A to balance in standing)     Toileting Toileting    Toileting assist Assist for toileting: Contact Guard/Touching assist     Transfers Chair/bed transfer  Transfers assist     Chair/bed transfer assist level: Moderate Assistance - Patient 50 - 74%     Locomotion Ambulation   Ambulation assist      Assist level: Contact Guard/Touching assist Assistive device: Walker-rolling Max distance: 15'   Walk 10 feet activity   Assist     Assist level: Moderate Assistance - Patient - 50 - 74% Assistive device: Walker-rolling   Walk 50 feet activity   Assist Walk 50 feet with 2 turns activity did not occur: Safety/medical concerns  Walk 150 feet activity   Assist Walk 150 feet activity did not occur: Safety/medical concerns         Walk 10 feet on uneven surface  activity   Assist Walk 10 feet on uneven surfaces activity did not occur: Safety/medical concerns         Wheelchair     Assist Will patient use wheelchair at discharge?: No Type of Wheelchair: Manual    Wheelchair assist level: Minimal Assistance - Patient > 75% Max wheelchair distance: 20'    Wheelchair 50 feet with 2 turns activity    Assist        Assist Level:  Minimal Assistance - Patient > 75%   Wheelchair 150 feet activity     Assist Wheelchair 150 feet activity did not occur: Safety/medical concerns          Medical Problem List and Plan: 1.  Weakness, balance deficits with posterior lean secondary to bilateral infarcts as well as right brain bleeds.  Continue CIR 2.  Antithrombotics: -DVT/anticoagulation:  Mechanical: Sequential compression devices, below knee Bilateral lower extremities             -antiplatelet therapy: ASA 3. Pain Management: Tylenol prn.  4. Mood: LCSW to follow for evaluation and support.              -antipsychotic agents: N/A 5. Neuropsych: This patient is capable of making decisions on her own behalf. 6. Skin/Wound Care: Routine pressure relief measures.  7. Fluids/Electrolytes/Nutrition: Routine I/Os.    BMP within acceptable range on 5/27 8. HTN: Monitor BP bid. Cozaar and Metoprolol---resume medications as indicated.    ?  Trending up on 5/29  Monitor with increased mobility 9.  Dyslipidemia: No meds due to NASH  10. COPD: Continue Daliresp and Incruse daily.   11. Hypothyroid: On supplement.  12.Transient urinary retention: Monitor for now.     PVRs x1 try retention, reordered again 13.H/o anemia: has had iron supplement in the past.    Hemoglobin 7.2 on 5/29, labs ordered for Monday  Continue to monitor 14.  Prediabetes  Continue to monitor  Elevated on 5/27  CBGs ordered 15.  Hypoalbuminemia  Supplement initiated on 5/28  LOS: 2 days A FACE TO FACE EVALUATION WAS PERFORMED  Gregory Barrick Lorie Phenix 11/19/2018, 10:56 AM

## 2018-11-19 NOTE — Progress Notes (Signed)
Occupational Therapy Session Note  Patient Details  Name: WEDNESDAY ERICSSON MRN: [id number] Date of Birth: [birth year]  Today's Date: 11/19/2018 OT Individual Time: 0786-7544 OT Individual Time Calculation (min): 64 min    Short Term Goals: Week 1:  OT Short Term Goal 1 (Week 1): Pt will transfer on and off toilet with min A. OT Short Term Goal 2 (Week 1): Pt will transfer on and off shower bench with min A. OT Short Term Goal 3 (Week 1): Pt will be able to stand with min A or less during LB self care.  Skilled Therapeutic Interventions/Progress Updates:    Patient in bed, states that she is tired but willing to participate in therapy session.  Bed mobility with CS.  SPT with RW to/from recliner with CGA and increased time.  Patient able to set up breakfast tray and eat independently.  She demonstrates good recall and awareness of needs.  Patient able to perform OH mobility activities - note occ pain in right shoulder (she reports that she had pain occasionally prior to CVA)  Patient able to facetime with family members and direct them to all items needed in her bath and bedrooms.  Completed hair washing and combing min A.  Sit to stand from recliner with CGA.  Patient remained in recliner at close of session with seat alarm set and call bell in reach.    Therapy Documentation Precautions:  Precautions Precautions: Fall Precaution Comments: ataxia Restrictions Weight Bearing Restrictions: No General:   Vital Signs:   Pain: Pain Assessment Pain Scale: 0-10 Pain Score: 0-No pain   Other Treatments:     Therapy/Group: Individual Therapy  Carlos Levering 11/19/2018, 12:07 PM

## 2018-11-19 NOTE — Progress Notes (Signed)
Occupational Therapy Session Note  Patient Details  Name: Stephanie Love MRN: [id number] Date of Birth: 05/05/1936  Today's Date: 11/19/2018 OT Individual Time: 1400-1500 OT Individual Time Calculation (min): 60 min    Short Term Goals: Week 1:  OT Short Term Goal 1 (Week 1): Pt will transfer on and off toilet with min A. OT Short Term Goal 2 (Week 1): Pt will transfer on and off shower bench with min A. OT Short Term Goal 3 (Week 1): Pt will be able to stand with min A or less during LB self care.  Skilled Therapeutic Interventions/Progress Updates:    Treatment session with focus on LUE fine and gross motor coordination.  Pt received upright in recliner reporting recent episode of "heaving" and expressing no physical activity.  Educated on purpose and goals of therapy with pt agreeable to LUE coordination tasks in sitting position.  Engaged in Connect 4 activity with focus on trunk control with reaching to obtain pieces as well as picking pieces up from table for fine motor control.  Pt demonstrating increased control with low range movements, demonstrating increased difficulty and incoordination in midrange reaching.  Educated on functional carryover of task as pt easily frustrated and often giving up prematurely.  Completed stand pivot transfer Min assist recliner > bed and left semi-reclined with all needs in reach.  Therapy Documentation Precautions:  Precautions Precautions: Fall Precaution Comments: ataxia Restrictions Weight Bearing Restrictions: No Vital Signs: Therapy Vitals Temp: 98.5 F (36.9 C) Temp Source: Oral Pulse Rate: 83 Resp: 15 BP: 115/65 Patient Position (if appropriate): Sitting Oxygen Therapy SpO2: 99 % O2 Device: Room Air Pain: Pain Assessment Pain Scale: 0-10 Pain Score: 0-No pain   Therapy/Group: Individual Therapy  Simonne Come 11/19/2018, 3:17 PM

## 2018-11-19 NOTE — Progress Notes (Signed)
Physical Therapy Session Note  Patient Details  Name: Stephanie Love MRN: [id number] Date of Birth: 14-May-1936  Today's Date: 11/19/2018 PT Individual Time: 1100-1230 PT Individual Time Calculation (min): 90 min   Short Term Goals: Week 1:  PT Short Term Goal 1 (Week 1): Patient to perform transfers consistently min A. PT Short Term Goal 2 (Week 1): Patient to ambulate 59' with LRAD and min A PT Short Term Goal 3 (Week 1): Patient to demonstrate standing balance wtih 1 UE support and S for functional task PT Short Term Goal 4 (Week 1): Patient to negotiate stairs x 4 with min A with rails  Skilled Therapeutic Interventions/Progress Updates:    Patient in recliner in room.  Reports had little sleep.  States L ear pain like her shingles pain she has at times and comes and goes.  Patient sit to stand min A to RW and ambulated with min A cues for and assist at times to correct R lateral head lean x 150'.  Standing at wall rail ant/post rocking with cues and demo min A for balance.  Standing lateral weight shifts to L towards wall initiating with L lateral head tilt.    Patient seated for VOR and lateral head tilts while looking at playing card.  Patient sit to L sidelying min A and noted no nystagmus in position.  Rolled to back and then to R side and noted no nystagmus but does have L beating nystagmus with eyes turned to L.  Dix hallpike to R then L without increased symptoms or noted nystagmus.  Patient did feel more unsteady coming up from R hall pike as compared to coming up from L hall pike.  Patient seated for manual work on c-spine and traction for head "heaviness" relief and to engage Cervical receptors for balance/proprioception.    Education throughout session for reason for imbalance, possibility of prior vestibular involvement and now with cerebellar CVA and L side incoordination/weakness making more difficulty to orient to upright and feel balanced.   Patient standing step taps to 4:  step for balance with R HHA and min A.  Sit<>stand without UE support x 5 with mod to min A with cues and A for COG over BOS and for increased LE use.  Patient squat pivot to w/c mod A and cues for technique.  Assisted in w/c to room.  Patient squat pivot to recliner and seated requested ginger ale, then pt with nausea and dry heaves.  RN made aware.  Patient recovered and left with ginger ale, lunch tray set up and RN delivered nausea medicaiton.  Chair alarm activated and call bell in reach.   Therapy Documentation Precautions:  Precautions Precautions: Fall Precaution Comments: ataxia Restrictions Weight Bearing Restrictions: No Pain: Pain Assessment Pain Scale: Faces Faces Pain Scale: Hurts even more Pain Type: Acute pain Pain Location: Ear Pain Orientation: Left Pain Descriptors / Indicators: Sharp Pain Onset: Sudden Pain Intervention(s): Repositioned;Distraction    Therapy/Group: Individual Therapy  [place], PT 11/19/2018, 4:58 PM

## 2018-11-20 LAB — GLUCOSE, CAPILLARY
Glucose-Capillary: 101 mg/dL — ABNORMAL HIGH (ref 70–99)
Glucose-Capillary: 111 mg/dL — ABNORMAL HIGH (ref 70–99)
Glucose-Capillary: 107 mg/dL — ABNORMAL HIGH (ref 70–99)
Glucose-Capillary: 83 mg/dL (ref 70–99)

## 2018-11-20 NOTE — Plan of Care (Signed)
  Problem: RH SKIN INTEGRITY Goal: RH STG SKIN FREE OF INFECTION/BREAKDOWN Outcome: Progressing   

## 2018-11-20 NOTE — Progress Notes (Signed)
[facility] PHYSICAL MEDICINE & REHABILITATION PROGRESS NOTE  Subjective/Complaints: Patient seen laying in bed this morning.  She states she slept well overnight.  She states therapies are going well.  ROS: Denies CP, shortness of breath, nausea, vomiting, diarrhea.  Objective: Vital Signs: Blood pressure 127/64, pulse 81, temperature 98.3 F (36.8 C), temperature source Oral, resp. rate 20, height 5\' 2"  (1.575 m), weight 59 kg, SpO2 98 %. No results found. Recent Labs    11/17/18 1438 11/19/18 0429  WBC 5.8 5.6  HGB 7.0* 7.2*  HCT 24.7* 25.4*  PLT 213 223   Recent Labs    11/17/18 1438  NA 135  K 3.5  CL 105  CO2 22  GLUCOSE 162*  BUN 17  CREATININE 0.88  CALCIUM 9.2    Physical Exam: BP 127/64 (BP Location: Left Arm)   Pulse 81   Temp 98.3 F (36.8 C) (Oral)   Resp 20   Ht 5\' 2"  (1.575 m)   Wt 59 kg   SpO2 98%   BMI 23.79 kg/m  Constitutional: No distress . Vital signs reviewed. HENT: Normocephalic.  Atraumatic. Eyes: EOMI.  No discharge. Cardiovascular: No JVD. Respiratory: Normal effort. GI: Non-distended. Musc: No edema or tenderness in extremities. Neurological: She is alert and oriented. Motor: Right upper extremity/right lower extremity: 5/5 proximal distal, stable Left upper extremity/left lower extremity: 4-4+/5 proximal distal, stable HOH Skin: Skin is warm and dry.  Psychiatric: She has a normal mood and affect. Her behavior is normal.   Assessment/Plan: 1. Functional deficits secondary to bilateral brain infarcts as well as right brain bleeds which require 3+ hours per day of interdisciplinary therapy in a comprehensive inpatient rehab setting.  Physiatrist is providing close team supervision and 24 hour management of active medical problems listed below.  Physiatrist and rehab team continue to assess barriers to discharge/monitor patient progress toward functional and medical goals  Care Tool:  Bathing    Body parts bathed by  patient: Right arm, Left arm, Chest, Abdomen, Front perineal area, Buttocks, Right upper leg, Left upper leg, Right lower leg, Left lower leg, Face         Bathing assist Assist Level: Moderate Assistance - Patient 50 - 74%(to balance in standing)     Upper Body Dressing/Undressing Upper body dressing   What is the patient wearing?: Bra, Pull over shirt    Upper body assist Assist Level: Set up assist    Lower Body Dressing/Undressing Lower body dressing      What is the patient wearing?: Underwear/pull up, Pants     Lower body assist Assist for lower body dressing: Moderate Assistance - Patient 50 - 74%(mod A to balance in standing)     Toileting Toileting    Toileting assist Assist for toileting: Contact Guard/Touching assist     Transfers Chair/bed transfer  Transfers assist     Chair/bed transfer assist level: Moderate Assistance - Patient 50 - 74%     Locomotion Ambulation   Ambulation assist      Assist level: Minimal Assistance - Patient > 75% Assistive device: Walker-rolling Max distance: 150'   Walk 10 feet activity   Assist     Assist level: Minimal Assistance - Patient > 75% Assistive device: Walker-rolling   Walk 50 feet activity   Assist Walk 50 feet with 2 turns activity did not occur: Safety/medical concerns  Assist level: Minimal Assistance - Patient > 75% Assistive device: Walker-rolling    Walk 150 feet activity   Assist  Walk 150 feet activity did not occur: Safety/medical concerns  Assist level: Minimal Assistance - Patient > 75% Assistive device: Walker-rolling    Walk 10 feet on uneven surface  activity   Assist Walk 10 feet on uneven surfaces activity did not occur: Safety/medical concerns         Wheelchair     Assist Will patient use wheelchair at discharge?: No Type of Wheelchair: Manual    Wheelchair assist level: Minimal Assistance - Patient > 75% Max wheelchair distance: 54'    Wheelchair  50 feet with 2 turns activity    Assist        Assist Level: Minimal Assistance - Patient > 75%   Wheelchair 150 feet activity     Assist Wheelchair 150 feet activity did not occur: Safety/medical concerns          Medical Problem List and Plan: 1.  Weakness, balance deficits with posterior lean secondary to bilateral infarcts as well as right brain bleeds.  Continue CIR 2.  Antithrombotics: -DVT/anticoagulation:  Mechanical: Sequential compression devices, below knee Bilateral lower extremities             -antiplatelet therapy: ASA 3. Pain Management: Tylenol prn.  4. Mood: LCSW to follow for evaluation and support.              -antipsychotic agents: N/A 5. Neuropsych: This patient is capable of making decisions on her own behalf. 6. Skin/Wound Care: Routine pressure relief measures.  7. Fluids/Electrolytes/Nutrition: Routine I/Os.    BMP within acceptable range on 5/27 8. HTN: Monitor BP bid. Cozaar and Metoprolol---resume medications as indicated.    Controlled on 5/30  Monitor with increased mobility 9.  Dyslipidemia: No meds due to NASH  10. COPD: Continue Daliresp and Incruse daily.   11. Hypothyroid: On supplement.  12.Transient urinary retention:     PVRs showing borderline retention 13.H/o anemia: has had iron supplement in the past.    Hemoglobin 7.2 on 5/29, labs ordered for Monday  Continue to monitor 14.  Prediabetes  Continue to monitor  Slightly elevated on 5/30  CBGs ordered 15.  Hypoalbuminemia  Supplement initiated on 5/28  LOS: 3 days A FACE TO FACE EVALUATION WAS PERFORMED  Shanica Castellanos Lorie Phenix 11/20/2018, 2:10 PM

## 2018-11-21 ENCOUNTER — Inpatient Hospital Stay (HOSPITAL_COMMUNITY): Payer: Medicare Other

## 2018-11-21 ENCOUNTER — Inpatient Hospital Stay (HOSPITAL_COMMUNITY): Payer: Medicare Other | Admitting: Physical Therapy

## 2018-11-21 DIAGNOSIS — I10 Essential (primary) hypertension: Secondary | ICD-10-CM

## 2018-11-21 LAB — GLUCOSE, CAPILLARY
Glucose-Capillary: 83 mg/dL (ref 70–99)
Glucose-Capillary: 131 mg/dL — ABNORMAL HIGH (ref 70–99)
Glucose-Capillary: 125 mg/dL — ABNORMAL HIGH (ref 70–99)
Glucose-Capillary: 112 mg/dL — ABNORMAL HIGH (ref 70–99)

## 2018-11-21 NOTE — Progress Notes (Signed)
Occupational Therapy Session Note  Patient Details  Name: Stephanie Love MRN: [id number] Date of Birth: Dec 17, 1935  Today's Date: 11/21/2018 OT Individual Time: 1000-1050 Session 2: 1440-1540 OT Individual Time Calculation (min): 50 min Session 2: 60 min   Short Term Goals: Week 1:  OT Short Term Goal 1 (Week 1): Pt will transfer on and off toilet with min A. OT Short Term Goal 2 (Week 1): Pt will transfer on and off shower bench with min A. OT Short Term Goal 3 (Week 1): Pt will be able to stand with min A or less during LB self care.  Skilled Therapeutic Interventions/Progress Updates:    Session 1: Pt received in recliner c/o fatigue but no pain, agreeable to session. Pt completed sit> stand with mod A. She used RW to complete 100 ft of functional mobility with 1 LOB toward L, requiring mod A to correct. Pt sat at table and completed bimanual [facility] task, with a focus on L UE pincer grasp and reaching accuracy. Pt then completed [place] game with L hand stacking small blocks to increase coordination of entire L UE. Pt returned to room and was left supine with all needs met, bed alarm set.    Session 2: Session focused on shower and dressing tasks. Pt received bed level with no c/o pain. Pt completed ambulatory transfer into bathroom with RW, min A. Pt transferred onto toilet and completed toileting tasks while water warmed with CGA. Pt transferred onto [facility] with min A. Pt washed hair and UB body with (S). CGA balance assist provided during standing level LB bathing. Cueing provided for grab bar use, positioning and use of figure 4 technique to wash feet. Pt able to ring out washcloths bimanually, occasionally dropping washcloth when visual attention was interrupted. Pt returned to EOB and donned UB clothing with (S), CGA for pants. Pt left supine with all needs met, bed alarm set.   Therapy Documentation Precautions:  Precautions Precautions: Fall Precaution Comments:  ataxia Restrictions Weight Bearing Restrictions: No   Therapy/Group: Individual Therapy  Curtis Sites 11/21/2018, 7:14 AM

## 2018-11-21 NOTE — Progress Notes (Addendum)
Physical Therapy Session Note  Patient Details  Name: ROSANNA BICKLE MRN: [id number] Date of Birth: 08/21/1935  Today's Date: 11/21/2018 PT Individual Time: 0820-0915 PT Individual Time Calculation (min): 55 min   Short Term Goals: Week 1:  PT Short Term Goal 1 (Week 1): Patient to perform transfers consistently min A. PT Short Term Goal 2 (Week 1): Patient to ambulate 66' with LRAD and min A PT Short Term Goal 3 (Week 1): Patient to demonstrate standing balance wtih 1 UE support and S for functional task PT Short Term Goal 4 (Week 1): Patient to negotiate stairs x 4 with min A with rails  Skilled Therapeutic Interventions/Progress Updates:  Pt received in bed, consuming breakfast but agreeable to tx. Pt denies c/o pain but reports her "head feels heavy and it makes my neck gets tired, and I get dizzy". Pt transfers supine>sitting EOB with supervision & extra time and sit<>stand with min assist with cuing for safe hand placement. Pt ambulates 150 ft with RW & min assist with intermittent facilitation for weight shifting R, cuing for increased step width LLE as pt will begin to maintain weight shift to L, impaired LLE foot clearance during swing phase, and narrow step width LLE. Therapist also provides cuing for proper use of RW (pt to ambulate within base of AD vs pushing it out in front) and cuing to relax B shoulders as they are elevated with only fair<>poor return demo. Pt performs 2 sets of 5x sit<>Stand from recliner with mod assist with max cuing for anterior weight shifting then B hip extensor activation & anterior pelvis shift with task focusing on BLE strengthening & overall coordination. Pt engaged in standing and tapping colored targets on floor with LUE & min/mod assist for standing balance with task focusing on weight shifting L<>R, dynamic balance, and LLE coordination for NMR. At end of session pt left sitting in recliner with BLE elevated, chair alarm donned & all needs at hand (call  bell in reach). Pt reports fatigue at end of session.  Pt very fearful of falling & reports increased dizziness with anterior weight shifting.  Therapy Documentation Precautions:  Precautions Precautions: Fall Precaution Comments: ataxia Restrictions Weight Bearing Restrictions: No      Therapy/Group: Individual Therapy  Waunita Schooner 11/21/2018, 9:17 AM

## 2018-11-21 NOTE — Progress Notes (Signed)
Approached by assigned NT( Jess), medication( Prevacid- non prescription) found at bedside of patient and removed, Patient stated she had taken one today(, will send to pharmacy. Patient was educated on policy and procedure with medications, states she understand.

## 2018-11-21 NOTE — Progress Notes (Signed)
[place] PHYSICAL MEDICINE & REHABILITATION PROGRESS NOTE  Subjective/Complaints: Patient seen sitting up in bed this morning about work with therapies.  She states she slept well after going to sleep late last night.  She was later seen ambulating with therapies.  ROS: Denies CP, shortness of breath, nausea, vomiting, diarrhea.  Objective: Vital Signs: Blood pressure 119/65, pulse 79, temperature 97.6 F (36.4 C), temperature source Oral, resp. rate 18, height 5\' 2"  (1.575 m), weight 59 kg, SpO2 97 %. No results found. Recent Labs    11/19/18 0429  WBC 5.6  HGB 7.2*  HCT 25.4*  PLT 223   No results for input(s): NA, K, CL, CO2, GLUCOSE, BUN, CREATININE, CALCIUM in the last 72 hours.  Physical Exam: BP 119/65 (BP Location: Right Arm)   Pulse 79   Temp 97.6 F (36.4 C) (Oral)   Resp 18   Ht 5\' 2"  (1.575 m)   Wt 59 kg   SpO2 97%   BMI 23.79 kg/m  Constitutional: No distress . Vital signs reviewed. HENT: Normocephalic.  Atraumatic. Eyes: EOMI.  No discharge. Cardiovascular: No JVD. Respiratory: Normal effort. GI: Non-distended. Musc: No edema or tenderness in extremities. Neurological: Alert and oriented. Motor: Right upper extremity/right lower extremity: 5/5 proximal distal, stable Left upper extremity/left lower extremity: 4-4+/5 proximal distal, unchanged  HOH Skin: Skin is warm and dry.  Psychiatric: She has a normal mood and affect. Her behavior is normal.   Assessment/Plan: 1. Functional deficits secondary to bilateral brain infarcts as well as right brain bleeds which require 3+ hours per day of interdisciplinary therapy in a comprehensive inpatient rehab setting.  Physiatrist is providing close team supervision and 24 hour management of active medical problems listed below.  Physiatrist and rehab team continue to assess barriers to discharge/monitor patient progress toward functional and medical goals  Care Tool:  Bathing    Body parts bathed by  patient: Right arm, Left arm, Chest, Abdomen, Front perineal area, Buttocks, Right upper leg, Left upper leg, Right lower leg, Left lower leg, Face         Bathing assist Assist Level: Moderate Assistance - Patient 50 - 74%(to balance in standing)     Upper Body Dressing/Undressing Upper body dressing   What is the patient wearing?: Bra, Pull over shirt    Upper body assist Assist Level: Set up assist    Lower Body Dressing/Undressing Lower body dressing      What is the patient wearing?: Underwear/pull up, Pants     Lower body assist Assist for lower body dressing: Moderate Assistance - Patient 50 - 74%(mod A to balance in standing)     Toileting Toileting    Toileting assist Assist for toileting: Minimal Assistance - Patient > 75%     Transfers Chair/bed transfer  Transfers assist     Chair/bed transfer assist level: Moderate Assistance - Patient 50 - 74%     Locomotion Ambulation   Ambulation assist      Assist level: Minimal Assistance - Patient > 75% Assistive device: Walker-rolling Max distance: 150'   Walk 10 feet activity   Assist     Assist level: Minimal Assistance - Patient > 75% Assistive device: Walker-rolling   Walk 50 feet activity   Assist Walk 50 feet with 2 turns activity did not occur: Safety/medical concerns  Assist level: Minimal Assistance - Patient > 75% Assistive device: Walker-rolling    Walk 150 feet activity   Assist Walk 150 feet activity did not occur: Safety/medical  concerns  Assist level: Minimal Assistance - Patient > 75% Assistive device: Walker-rolling    Walk 10 feet on uneven surface  activity   Assist Walk 10 feet on uneven surfaces activity did not occur: Safety/medical concerns         Wheelchair     Assist Will patient use wheelchair at discharge?: No Type of Wheelchair: Manual    Wheelchair assist level: Minimal Assistance - Patient > 75% Max wheelchair distance: 11'     Wheelchair 50 feet with 2 turns activity    Assist        Assist Level: Minimal Assistance - Patient > 75%   Wheelchair 150 feet activity     Assist Wheelchair 150 feet activity did not occur: Safety/medical concerns          Medical Problem List and Plan: 1.  Weakness, balance deficits with posterior lean secondary to bilateral infarcts as well as right brain bleeds.  Continue CIR  Patient notes to be MRSA +, discussed with nursing contact precautions 2.  Antithrombotics: -DVT/anticoagulation:  Mechanical: Sequential compression devices, below knee Bilateral lower extremities             -antiplatelet therapy: ASA 3. Pain Management: Tylenol prn.  4. Mood: LCSW to follow for evaluation and support.              -antipsychotic agents: N/A 5. Neuropsych: This patient is capable of making decisions on her own behalf. 6. Skin/Wound Care: Routine pressure relief measures.  7. Fluids/Electrolytes/Nutrition: Routine I/Os.    BMP within acceptable range on 5/27 8. HTN: Monitor BP bid. On Cozaar PTA.  Cont Metoprolol  Controlled on 5/31  Monitor with increased mobility 9.  Dyslipidemia: No meds due to NASH  10. COPD: Continue Daliresp and Incruse daily.   11. Hypothyroid: On supplement.  12.Transient urinary retention:     PVRs showing borderline retention 13.H/o anemia: has had iron supplement in the past.    Hemoglobin 7.2 on 5/29, labs ordered for tomorrow  Continue to monitor 14.  Prediabetes  Continue to monitor  CBGs ?  Trending up on 5/31 15.  Hypoalbuminemia  Supplement initiated on 5/28  LOS: 4 days A FACE TO FACE EVALUATION WAS PERFORMED  Ankit Lorie Phenix 11/21/2018, 1:30 PM

## 2018-11-22 ENCOUNTER — Inpatient Hospital Stay (HOSPITAL_COMMUNITY): Payer: Medicare Other | Admitting: Occupational Therapy

## 2018-11-22 ENCOUNTER — Inpatient Hospital Stay (HOSPITAL_COMMUNITY): Payer: Medicare Other

## 2018-11-22 ENCOUNTER — Inpatient Hospital Stay (HOSPITAL_COMMUNITY): Payer: Medicare Other | Admitting: Physical Therapy

## 2018-11-22 LAB — CBC
HCT: 26.9 % — ABNORMAL LOW (ref 36.0–46.0)
Hemoglobin: 7.4 g/dL — ABNORMAL LOW (ref 12.0–15.0)
MCH: 18.7 pg — ABNORMAL LOW (ref 26.0–34.0)
MCHC: 27.5 g/dL — ABNORMAL LOW (ref 30.0–36.0)
MCV: 67.9 fL — ABNORMAL LOW (ref 80.0–100.0)
Platelets: 272 10*3/uL (ref 150–400)
RBC: 3.96 MIL/uL (ref 3.87–5.11)
RDW: 17.1 % — ABNORMAL HIGH (ref 11.5–15.5)
WBC: 5.5 10*3/uL (ref 4.0–10.5)
nRBC: 0 % (ref 0.0–0.2)

## 2018-11-22 LAB — GLUCOSE, CAPILLARY
Glucose-Capillary: 85 mg/dL (ref 70–99)
Glucose-Capillary: 108 mg/dL — ABNORMAL HIGH (ref 70–99)
Glucose-Capillary: 97 mg/dL (ref 70–99)

## 2018-11-22 MED ORDER — PANTOPRAZOLE SODIUM 40 MG PO TBEC
40.0000 mg | DELAYED_RELEASE_TABLET | Freq: Every day | ORAL | Status: DC
  Administered 2018-11-22 – 2018-11-30 (×9): 40 mg via ORAL
  Filled 2018-11-22 (×9): qty 1

## 2018-11-22 NOTE — Progress Notes (Signed)
Physical Therapy Session Note  Patient Details  Name: Stephanie Love MRN: [id number] Date of Birth: [birth year]  Today's Date: 11/22/2018 PT Individual Time: 0737-1062 PT Individual Time Calculation (min): 45 min   Short Term Goals: Week 1:  PT Short Term Goal 1 (Week 1): Patient to perform transfers consistently min A. PT Short Term Goal 2 (Week 1): Patient to ambulate 59' with LRAD and min A PT Short Term Goal 3 (Week 1): Patient to demonstrate standing balance wtih 1 UE support and S for functional task PT Short Term Goal 4 (Week 1): Patient to negotiate stairs x 4 with min A with rails  Skilled Therapeutic Interventions/Progress Updates:  Pt in bed on arrival to room. Agreeable to PT at this time.  Supervision for supine to sitting edge of bed with rail, HOB 30 degrees and increased time/effort needed. Seated edge of bed with supervision due to mild dizziness that resolved. Pt able to self remove her socks and needed min assist to don shoes. Min assist to stand from bed to RW with cues on hand placement and forward weight shifting. Min assist for gait with RW from room to NuStep in hallway. Cues for posture, to keep RW closer/stay within RW and for increased bil step length with gait. Min assist for turning and sitting to NuStep with cues to reach back.NuStep with bil UE/LE's on level 4.0 for 10 minutes with goal >/=40 steps per minute for strengthening and activity tolerance. Min guard assist to stand from NuStep to RW. Gait back to room with RW, min assist and cues as previously stated. Min assist for turning and sitting controlled in recliner.  Worked on balance reactions for remainder of session- standing on floor surface with feet hip width apart, bil UE support- alternating forward toe taps to cones with cues on posture, weight shifting and increased hip/knee flexion, min assist for balance. Pt with mild dizziness after this therefore worked on seated strengthening before standing again-  HS curls with level 2 band for 10 reps each side with cues for slow, controlled movements; standing on floor with feet hip width apart no UE support- EC for 30 sec's x 2 reps, progressing to EO for head movements left<>right, then up<>down. Min guard to min assist for balance with cues on posture and weight shifting to assist with balance. Min guard assist to sit back in recliner with pt able to scoot hips back on her own. No dizziness reported with this activity.  Pt left in chair with feet up, belt alarm on and all needs in reach. Pt is progressing well toward goals. Less overall dizziness with activity today.   Therapy Documentation Precautions:  Precautions Precautions: Fall Precaution Comments: ataxia Restrictions Weight Bearing Restrictions: No General:      Therapy/Group: Individual Therapy  Willow Ora, PTA 11/22/2018, 4:04 PM

## 2018-11-22 NOTE — Progress Notes (Signed)
Occupational Therapy Session Note  Patient Details  Name: Stephanie Love MRN: [id number] Date of Birth: [birth year]  Today's Date: 11/22/2018 OT Individual Time: 1200-1315 OT Individual Time Calculation (min): 75 min    Skilled Therapeutic Interventions/Progress Updates: Focus this session was gaze stailibilization (PRN, especially upon initiall moving forward in attempt to stand and upon initially standing), toileting, functional mobility, left upper extremity motor control and motor planning.     She was able to complete toilet transfer (via RW and room toilet seat) with supervision; toileitng=distant supervision.  She was left seated in recliner and set up for lunch at the end of the session and with call bel land phone within reach.     Therapy Documentation Precautions:  Precautions Precautions: Fall Precaution Comments: ataxia Restrictions Weight Bearing Restrictions: No  Pain:denied     Therapy/Group: Individual Therapy  Alfredia Ferguson [facility] 11/22/2018, 4:19 PM

## 2018-11-22 NOTE — Progress Notes (Signed)
Physical Therapy Session Note  Patient Details  Name: Stephanie Love MRN: [id number] Date of Birth: 1936-02-02  Today's Date: 11/22/2018 PT Individual Time: 4383-8184 PT Individual Time Calculation (min): 49 min   Short Term Goals: Week 1:  PT Short Term Goal 1 (Week 1): Patient to perform transfers consistently min A. PT Short Term Goal 2 (Week 1): Patient to ambulate 41' with LRAD and min A PT Short Term Goal 3 (Week 1): Patient to demonstrate standing balance wtih 1 UE support and S for functional task PT Short Term Goal 4 (Week 1): Patient to negotiate stairs x 4 with min A with rails  Skilled Therapeutic Interventions/Progress Updates:    Patient in supine asleep, but easily aroused.  Reports was fatigued earlier and really feels some of her dizziness and a lot of her fatigue coming from low iron.  Reports gets iron infusions as outpatient regularly.  Patient supine to sit CGA with increased time.  Requesting to toilet.  Sit to stand to RW CGA and ambulated 10' with RW and min A.  Min A to toilet, then to sink with no UE support to wash hands pt standing in stride with S for balance.  Patient ambulated 80' with min A for balance.    Standing balance on wobble board with R/L and then with A/P orientation with back of chair for UE support. Min A and cues for hip strategy with perturbations controlled by pt.    Stepping sideways over cones with rail for support and min A to L and R.  Then stepping forward over cones for stride length and balance with rail and HHA.  Patient ambulated to room with RW min A.  Seated in recliner requesting to lie down.  Assisted to stand step to bed HHA and to supine with S, but encouraged up OOB for dinner in chair.  Pt with bed alarm active and call bell/needs in reach.   Therapy Documentation Precautions:  Precautions Precautions: Fall Precaution Comments: ataxia Restrictions Weight Bearing Restrictions: No Pain: Pain Assessment Faces Pain Scale: Hurts  a little bit Pain Type: Acute pain Pain Location: Head Pain Orientation: Right Pain Descriptors / Indicators: Heaviness Pain Onset: On-going Pain Intervention(s): Repositioned    Therapy/Group: Individual Therapy  Reginia Naas  Magda Kiel, PT 11/22/2018, 4:46 PM

## 2018-11-22 NOTE — Progress Notes (Signed)
PHYSICAL MEDICINE & REHABILITATION PROGRESS NOTE  Subjective/Complaints: Patient seen sitting up in her bed eating breakfast this morning.  She states she slept well overnight.  She states that she is trying to eat healthy.  She has questions about her hemoglobin and states that she was getting iron infusions as an outpatient.  ROS: Denies CP, shortness of breath, nausea, vomiting, diarrhea.  Objective: Vital Signs: Blood pressure (!) 125/57, pulse 65, temperature 97.8 F (36.6 C), temperature source Oral, resp. rate 16, height 5\' 2"  (1.575 m), weight 59 kg, SpO2 99 %. No results found. No results for input(s): WBC, HGB, HCT, PLT in the last 72 hours. No results for input(s): NA, K, CL, CO2, GLUCOSE, BUN, CREATININE, CALCIUM in the last 72 hours.  Physical Exam: BP (!) 125/57 (BP Location: Left Arm)   Pulse 65   Temp 97.8 F (36.6 C) (Oral)   Resp 16   Ht 5\' 2"  (1.575 m)   Wt 59 kg   SpO2 99%   BMI 23.79 kg/m  Constitutional: No distress . Vital signs reviewed. HENT: Normocephalic.  Atraumatic. Eyes: EOMI.  No discharge. Cardiovascular: No JVD. Respiratory: Normal effort. GI: Non-distended. Musc: No edema or tenderness in extremities. Neurological: Alert and oriented. Motor: Right upper extremity/right lower extremity: 5/5 proximal distal, unchanged Left upper extremity/left lower extremity: 4-4+/5 proximal distal, unchanged HOH Skin: Skin is warm and dry.  Psychiatric: She has a normal mood and affect. Her behavior is normal.   Assessment/Plan: 1. Functional deficits secondary to bilateral brain infarcts as well as right brain bleeds which require 3+ hours per day of interdisciplinary therapy in a comprehensive inpatient rehab setting.  Physiatrist is providing close team supervision and 24 hour management of active medical problems listed below.  Physiatrist and rehab team continue to assess barriers to discharge/monitor patient progress toward functional  and medical goals  Care Tool:  Bathing    Body parts bathed by patient: Right arm, Left arm, Chest, Abdomen, Front perineal area, Buttocks, Right upper leg, Left upper leg, Right lower leg, Left lower leg, Face         Bathing assist Assist Level: Contact Guard/Touching assist     Upper Body Dressing/Undressing Upper body dressing   What is the patient wearing?: Bra, Pull over shirt    Upper body assist Assist Level: Set up assist    Lower Body Dressing/Undressing Lower body dressing      What is the patient wearing?: Underwear/pull up, Pants     Lower body assist Assist for lower body dressing: Contact Guard/Touching assist     Toileting Toileting    Toileting assist Assist for toileting: Minimal Assistance - Patient > 75%     Transfers Chair/bed transfer  Transfers assist     Chair/bed transfer assist level: Minimal Assistance - Patient > 75%     Locomotion Ambulation   Ambulation assist      Assist level: Minimal Assistance - Patient > 75% Assistive device: Walker-rolling Max distance: 150'   Walk 10 feet activity   Assist     Assist level: Minimal Assistance - Patient > 75% Assistive device: Walker-rolling   Walk 50 feet activity   Assist Walk 50 feet with 2 turns activity did not occur: Safety/medical concerns  Assist level: Minimal Assistance - Patient > 75% Assistive device: Walker-rolling    Walk 150 feet activity   Assist Walk 150 feet activity did not occur: Safety/medical concerns  Assist level: Minimal Assistance - Patient > 75% Assistive  device: Walker-rolling    Walk 10 feet on uneven surface  activity   Assist Walk 10 feet on uneven surfaces activity did not occur: Safety/medical concerns         Wheelchair     Assist Will patient use wheelchair at discharge?: No Type of Wheelchair: Manual    Wheelchair assist level: Minimal Assistance - Patient > 75% Max wheelchair distance: 62'    Wheelchair 50  feet with 2 turns activity    Assist        Assist Level: Minimal Assistance - Patient > 75%   Wheelchair 150 feet activity     Assist Wheelchair 150 feet activity did not occur: Safety/medical concerns          Medical Problem List and Plan: 1.  Weakness, balance deficits with posterior lean secondary to bilateral infarcts as well as right brain bleeds.  Continue CIR 2.  Antithrombotics: -DVT/anticoagulation:  Mechanical: Sequential compression devices, below knee Bilateral lower extremities             -antiplatelet therapy: ASA 3. Pain Management: Tylenol prn.  4. Mood: LCSW to follow for evaluation and support.              -antipsychotic agents: N/A 5. Neuropsych: This patient is capable of making decisions on her own behalf. 6. Skin/Wound Care: Routine pressure relief measures.  7. Fluids/Electrolytes/Nutrition: Routine I/Os.    BMP within acceptable range on 5/27 8. HTN: Monitor BP bid. On Cozaar PTA.  Cont Metoprolol  Overall controlled on 6/1  Monitor with increased mobility 9.  Dyslipidemia: No meds due to NASH  10. COPD: Continue Daliresp and Incruse daily.   11. Hypothyroid: On supplement.  12.Transient urinary retention:     PVRs showing borderline retention 13.H/o anemia: has had iron supplement in the past.    Hemoglobin 7.2 on 5/29, labs pending  Continue to monitor 14.  Prediabetes  Continue to monitor  Labile on 6/1 15.  Hypoalbuminemia  Supplement initiated on 5/28  LOS: 5 days A FACE TO FACE EVALUATION WAS PERFORMED  Wright Gravely Lorie Phenix 11/22/2018, 10:11 AM

## 2018-11-22 NOTE — Progress Notes (Signed)
Occupational Therapy Session Note  Patient Details  Name: Stephanie Love MRN: [id number] Date of Birth: July 03, 1935  Today's Date: 11/22/2018 OT Individual Time: 4268-3419 OT Individual Time Calculation (min): 45 min   Skilled Therapeutic Interventions/Progress Updates: Patient stated she was already dressed for the day and did not prefer to work on bathing or dressing.  Focus of session was on sit to stand and gaze stabilization upon standing and balance during functional activities and dynamic movements.  Patient was able to incorporate gaze stabilization when needed during dynamic movements.   She had no losses of balance.  She completed oral care standing at sink with walker nearby without losses of balance.  She was left seated in her recliner with call bell and chair alarm in place at the end of the session.     Therapy Documentation Precautions:  Precautions Precautions: Fall Precaution Comments: ataxia Restrictions Weight Bearing Restrictions: No  Pain:denied   Therapy/Group: Individual Therapy  Alfredia Ferguson [facility] 11/22/2018, 3:57 PM

## 2018-11-23 ENCOUNTER — Inpatient Hospital Stay (HOSPITAL_COMMUNITY): Payer: Medicare Other

## 2018-11-23 ENCOUNTER — Inpatient Hospital Stay (HOSPITAL_COMMUNITY): Payer: Medicare Other | Admitting: Occupational Therapy

## 2018-11-23 DIAGNOSIS — K5901 Slow transit constipation: Secondary | ICD-10-CM

## 2018-11-23 DIAGNOSIS — D509 Iron deficiency anemia, unspecified: Secondary | ICD-10-CM

## 2018-11-23 LAB — GLUCOSE, CAPILLARY
Glucose-Capillary: 101 mg/dL — ABNORMAL HIGH (ref 70–99)
Glucose-Capillary: 129 mg/dL — ABNORMAL HIGH (ref 70–99)
Glucose-Capillary: 96 mg/dL (ref 70–99)
Glucose-Capillary: 131 mg/dL — ABNORMAL HIGH (ref 70–99)

## 2018-11-23 MED ORDER — POLYETHYLENE GLYCOL 3350 17 G PO PACK
17.0000 g | PACK | Freq: Two times a day (BID) | ORAL | Status: DC
  Administered 2018-11-23 – 2018-11-29 (×5): 17 g via ORAL
  Filled 2018-11-23 (×11): qty 1

## 2018-11-23 MED ORDER — FERROUS SULFATE 325 (65 FE) MG PO TABS
325.0000 mg | ORAL_TABLET | Freq: Two times a day (BID) | ORAL | Status: DC
  Administered 2018-11-23 – 2018-11-25 (×5): 325 mg via ORAL
  Filled 2018-11-23 (×5): qty 1

## 2018-11-23 MED ORDER — VITAMIN C 500 MG PO TABS
500.0000 mg | ORAL_TABLET | Freq: Two times a day (BID) | ORAL | Status: DC
  Administered 2018-11-23 – 2018-11-26 (×7): 500 mg via ORAL
  Filled 2018-11-23 (×8): qty 1

## 2018-11-23 NOTE — Progress Notes (Signed)
Unless return of the weather-#4 adenoiditis [place] PHYSICAL MEDICINE & REHABILITATION PROGRESS NOTE  Subjective/Complaints: Patient seen laying in bed this morning.  She states she did not sleep well after having to use the restroom overnight.  She states she feels constipated.  She has questions regarding her hemoglobin, reviewed value and trend with patient.  ROS: Denies CP, shortness of breath, nausea, vomiting, diarrhea.  Objective: Vital Signs: Blood pressure 101/63, pulse 89, temperature 98.3 F (36.8 C), temperature source Oral, resp. rate 18, height 5\' 2"  (1.575 m), weight 59 kg, SpO2 99 %. No results found. Recent Labs    11/22/18 1104  WBC 5.5  HGB 7.4*  HCT 26.9*  PLT 272   No results for input(s): NA, K, CL, CO2, GLUCOSE, BUN, CREATININE, CALCIUM in the last 72 hours.  Physical Exam: BP 101/63 (BP Location: Left Arm)   Pulse 89   Temp 98.3 F (36.8 C) (Oral)   Resp 18   Ht 5\' 2"  (1.575 m)   Wt 59 kg   SpO2 99%   BMI 23.79 kg/m  Constitutional: No distress . Vital signs reviewed. HENT: Normocephalic.  Atraumatic. Eyes: EOMI.  No discharge. Cardiovascular: No JVD. Respiratory: Normal effort. GI: Non-distended. Musc: No edema or tenderness in extremities. Neurological: Alert and oriented. Motor: Right upper extremity/right lower extremity: 5/5 proximal distal, stable Left upper extremity/left lower extremity: 4-4+/5 proximal distal, stable HOH Skin: Skin is warm and dry.  Psychiatric: She has a normal mood and affect. Her behavior is normal.   Assessment/Plan: 1. Functional deficits secondary to bilateral brain infarcts as well as right brain bleeds which require 3+ hours per day of interdisciplinary therapy in a comprehensive inpatient rehab setting.  Physiatrist is providing close team supervision and 24 hour management of active medical problems listed below.  Physiatrist and rehab team continue to assess barriers to discharge/monitor patient  progress toward functional and medical goals  Care Tool:  Bathing    Body parts bathed by patient: Right arm, Left arm, Chest, Abdomen, Front perineal area, Buttocks, Right upper leg, Left upper leg, Right lower leg, Left lower leg, Face         Bathing assist Assist Level: Contact Guard/Touching assist     Upper Body Dressing/Undressing Upper body dressing   What is the patient wearing?: Bra, Pull over shirt    Upper body assist Assist Level: Set up assist    Lower Body Dressing/Undressing Lower body dressing      What is the patient wearing?: Underwear/pull up, Pants     Lower body assist Assist for lower body dressing: Contact Guard/Touching assist     Toileting Toileting    Toileting assist Assist for toileting: Minimal Assistance - Patient > 75%     Transfers Chair/bed transfer  Transfers assist     Chair/bed transfer assist level: Minimal Assistance - Patient > 75%     Locomotion Ambulation   Ambulation assist      Assist level: Minimal Assistance - Patient > 75% Assistive device: Walker-rolling Max distance: 75'   Walk 10 feet activity   Assist     Assist level: Minimal Assistance - Patient > 75% Assistive device: Walker-rolling   Walk 50 feet activity   Assist Walk 50 feet with 2 turns activity did not occur: Safety/medical concerns  Assist level: Minimal Assistance - Patient > 75% Assistive device: Walker-rolling    Walk 150 feet activity   Assist Walk 150 feet activity did not occur: Safety/medical concerns  Assist level:  Minimal Assistance - Patient > 75% Assistive device: Walker-rolling    Walk 10 feet on uneven surface  activity   Assist Walk 10 feet on uneven surfaces activity did not occur: Safety/medical concerns         Wheelchair     Assist Will patient use wheelchair at discharge?: No Type of Wheelchair: Manual    Wheelchair assist level: Minimal Assistance - Patient > 75% Max wheelchair  distance: 48'    Wheelchair 50 feet with 2 turns activity    Assist        Assist Level: Minimal Assistance - Patient > 75%   Wheelchair 150 feet activity     Assist Wheelchair 150 feet activity did not occur: Safety/medical concerns          Medical Problem List and Plan: 1.  Weakness, balance deficits with posterior lean secondary to bilateral infarcts as well as right brain bleeds.  Continue CIR 2.  Antithrombotics: -DVT/anticoagulation:  Mechanical: Sequential compression devices, below knee Bilateral lower extremities             -antiplatelet therapy: ASA 3. Pain Management: Tylenol prn.  4. Mood: LCSW to follow for evaluation and support.              -antipsychotic agents: N/A 5. Neuropsych: This patient is capable of making decisions on her own behalf. 6. Skin/Wound Care: Routine pressure relief measures.  7. Fluids/Electrolytes/Nutrition: Routine I/Os.    BMP within acceptable range on 5/27 8. HTN: Monitor BP bid. On Cozaar PTA.  Cont Metoprolol  Controlled on 6/2  Monitor with increased mobility 9.  Dyslipidemia: No meds due to NASH  10. COPD: Continue Daliresp and Incruse daily.   11. Hypothyroid: On supplement.  12.Transient urinary retention:     PVRs showing borderline retention 13.H/o anemia:   Hemoglobin 7.4 on 6/1  Iron with Vit C ordered on 6/2  Continue to monitor 14.  Prediabetes  Continue to monitor  Controlled on 6/2 15.  Hypoalbuminemia  Supplement initiated on 5/28 16.  Constipation  Bowel regimen creased on 6/2  LOS: 6 days A FACE TO FACE EVALUATION WAS PERFORMED  Marializ Ferrebee Lorie Phenix 11/23/2018, 10:25 AM

## 2018-11-23 NOTE — Progress Notes (Signed)
Occupational Therapy Session Note  Patient Details  Name: Stephanie Love MRN: [id number] Date of Birth: 1935/10/22  Today's Date: 11/23/2018 OT Individual Time: 1000-1115 OT Individual Time Calculation (min): 75 min    Short Term Goals: Week 1:  OT Short Term Goal 1 (Week 1): Pt will transfer on and off toilet with min A. OT Short Term Goal 2 (Week 1): Pt will transfer on and off shower bench with min A. OT Short Term Goal 3 (Week 1): Pt will be able to stand with min A or less during LB self care.  Skilled Therapeutic Interventions/Progress Updates:    Pt received supine with no c/o pain. Pt agreeable to shower. Pt completed functional mobility into bathroom and transferred onto toilet with CGA. Pt completed toileting tasks with close (S), using grab bar for support. Pt then transferred onto [facility] in shower with CGA. She bathed UB with (S), CGA for LB bathing in standing. Pt transferred out of bathroom and to EOB. Pt donned bra and shirt with (S) EOB. Pt donned underwear and pants with CGA. Pt took brief rest break supine while donning socks. Pt then completed 50 ft of functional mobility with 1 LOB left, corrected with min A. Pt completed dynamic standing balance activity with RW and CGA-min A. Activities focused on brief single leg stance and functional stepping to simulate home environment. Pt returned to bed and left sitting EOB with PA and NT present.   Therapy Documentation Precautions:  Precautions Precautions: Fall Precaution Comments: ataxia Restrictions Weight Bearing Restrictions: No   Therapy/Group: Individual Therapy  Curtis Sites 11/23/2018, 7:14 AM

## 2018-11-23 NOTE — Plan of Care (Signed)
  Problem: RH BLADDER ELIMINATION Goal: RH STG MANAGE BLADDER WITH ASSISTANCE Description STG Manage Bladder With Mod I Assistance   Outcome: Progressing Flowsheets (Taken 11/23/2018 1253) STG: Pt will manage bladder with assistance: 4-Minimal assistance   Problem: RH SKIN INTEGRITY Goal: RH STG SKIN FREE OF INFECTION/BREAKDOWN Description No new breakdown with Mod I assist  Outcome: Progressing Goal: RH STG MAINTAIN SKIN INTEGRITY WITH ASSISTANCE Description STG Maintain Skin Integrity With Mod I Assistance.  Outcome: Progressing Flowsheets (Taken 11/23/2018 1253) STG: Maintain skin integrity with assistance: 4-Minimal assistance   Problem: RH SAFETY Goal: RH STG ADHERE TO SAFETY PRECAUTIONS W/ASSISTANCE/DEVICE Description STG Adhere to Safety Precautions With Min Assistance/Device.  Outcome: Progressing Flowsheets (Taken 11/23/2018 1253) STG:Pt will adhere to safety precautions with assistance/device: 4-Minimal assistance   Problem: RH PAIN MANAGEMENT Goal: RH STG PAIN MANAGED AT OR BELOW PT'S PAIN GOAL Description Pain free or pain less than 3  Outcome: Progressing   Problem: RH KNOWLEDGE DEFICIT Goal: RH STG INCREASE KNOWLEDGE OF STROKE PROPHYLAXIS Description Pt will be able to verbalize proper management of stoke prevention including managing HTN, Blood Sugars, and follow up care with Cardiologist with min assist/cues.   Outcome: Progressing   Problem: Consults Goal: RH STROKE PATIENT EDUCATION Description See Patient Education module for education specifics  Outcome: Progressing

## 2018-11-23 NOTE — Progress Notes (Signed)
The patient had loop implant done 11/16/2018 for cryptogenic stroke She is due for wound check visit/evaluation.  tegaderm and steri-strips remain in place.   Both removed without difficulty Wound is well healed, skin edges approximated.  No erythema, edema or drainage.  No increased heat to the surrounding tissues No evidence of infection Transmitter is plugged in at bedside  Tommye Standard, PA-C

## 2018-11-23 NOTE — Progress Notes (Signed)
Physical Therapy Session Note  Patient Details  Name: Stephanie Love MRN: [id number] Date of Birth: [birth year]  Today's Date: 11/23/2018 PT Individual Time: 1446-1600 PT Individual Time Calculation (min): 74 min   Short Term Goals: Week 1:  PT Short Term Goal 1 (Week 1): Patient to perform transfers consistently min A. PT Short Term Goal 2 (Week 1): Patient to ambulate 23' with LRAD and min A PT Short Term Goal 3 (Week 1): Patient to demonstrate standing balance wtih 1 UE support and S for functional task PT Short Term Goal 4 (Week 1): Patient to negotiate stairs x 4 with min A with rails  Skilled Therapeutic Interventions/Progress Updates:    Patient in supine reports had episode of N&V earlier with OT.  Noted RN a had delivered nausea medication.  Performed supine therex including hip/knee coordination moving in and out on L LE x 15, bridging x 15 with 5 sec hold, leg extensions x 10, hooklying hip abduction with orange t-band x 15.  LE's on blue physioball for lateral trunk rolls with legs on ball, then with feet on ball, trunk flex/ext rolling ball up toward hips then extending legs with feet on ball x 10.    Supine to sit EOB for eye exercises x 30 sec with near target for gaze stabilization for horizontal then vertical head movements.  Sit<>stand no UE support x 5 with min A increased time and cues. Performed seated chin tucks with head press x 5 sec x 5 reps.   Allowed supine rest breaks at times due to mild nausea and head fatigue.    Sit to stand CGA and side stepping with bilat UE support, then marching in place with bilat UE support.  Supine rest then performed 5 more reps of chin tuck with head press w/ 5 sec hold.  Pivot to w/c min A without walker.  Assist in w/c to Nu Step and pivot to Nu Step min A for UE/LE x 5 min at level 3.  Back to w/c then to bed min A and left with call bell in reach and bed alarm activated.   Therapy Documentation Precautions:  Precautions Precautions:  Fall Precaution Comments: ataxia Restrictions Weight Bearing Restrictions: No Pain: Pain Assessment Pain Score: 0-No pain    Therapy/Group: Individual Therapy  Reginia Naas  Magda Kiel, PT 11/23/2018, 2:51 PM

## 2018-11-23 NOTE — Progress Notes (Signed)
Occupational Therapy Session Note  Patient Details  Name: Stephanie Love MRN: [id number] Date of Birth: [birth year]  Today's Date: 11/23/2018 OT Individual Time: 1340-1425 OT Individual Time Calculation (min): 45 min    Short Term Goals: Week 1:  OT Short Term Goal 1 (Week 1): Pt will transfer on and off toilet with min A. OT Short Term Goal 2 (Week 1): Pt will transfer on and off shower bench with min A. OT Short Term Goal 3 (Week 1): Pt will be able to stand with min A or less during LB self care.  Skilled Therapeutic Interventions/Progress Updates:    Treatment session with focus on dynamic standing balance, functional mobility, and LUE motor control.  Pt received supine in bed on the phone.  Pt agreeable to therapy session, but reports hesitant to too much OOB activity.  Engaged in standing activity from EOB with CGA for standing balance.  Incorporated reaching with LUE to obtain cards from vertical surface and place on table to Lt to challenge balance while also focusing on gaze stabilization during task.  Ambulated 57' with RW with Min assist and returned to bed.  Pt reports nausea and then proceeded to vomit.  RN notified and in to assess and provide nausea meds.  Pt left in care of RN.  Therapy Documentation Precautions:  Precautions Precautions: Fall Precaution Comments: ataxia Restrictions Weight Bearing Restrictions: No General:   Vital Signs: Therapy Vitals Pulse Rate: 83 Resp: 20 BP: 103/65 Patient Position (if appropriate): Lying Oxygen Therapy SpO2: 98 % O2 Device: Room Air Pain: Pain Assessment Pain Score: 0-No pain   Therapy/Group: Individual Therapy  Simonne Come 11/23/2018, 3:05 PM

## 2018-11-23 NOTE — Progress Notes (Signed)
Patient c/o of nausea after laying back down in bed from [place] usage, po Compazine provided and ice- chips, monitor and assisted

## 2018-11-24 ENCOUNTER — Inpatient Hospital Stay (HOSPITAL_COMMUNITY): Payer: Medicare Other | Admitting: Physical Therapy

## 2018-11-24 ENCOUNTER — Inpatient Hospital Stay (HOSPITAL_COMMUNITY): Payer: Medicare Other

## 2018-11-24 LAB — GLUCOSE, CAPILLARY
Glucose-Capillary: 87 mg/dL (ref 70–99)
Glucose-Capillary: 106 mg/dL — ABNORMAL HIGH (ref 70–99)
Glucose-Capillary: 107 mg/dL — ABNORMAL HIGH (ref 70–99)
Glucose-Capillary: 83 mg/dL (ref 70–99)

## 2018-11-24 LAB — BASIC METABOLIC PANEL
Anion gap: 8 (ref 5–15)
BUN: 12 mg/dL (ref 8–23)
CO2: 22 mmol/L (ref 22–32)
Calcium: 9.6 mg/dL (ref 8.9–10.3)
Chloride: 107 mmol/L (ref 98–111)
Creatinine, Ser: 0.9 mg/dL (ref 0.44–1.00)
GFR calc Af Amer: >60 mL/min (ref >60)
GFR calc non Af Amer: 60 mL/min — ABNORMAL LOW (ref >60)
Glucose, Bld: 87 mg/dL (ref 70–99)
Potassium: 4.1 mmol/L (ref 3.5–5.1)
Sodium: 137 mmol/L (ref 135–145)

## 2018-11-24 NOTE — Progress Notes (Signed)
Occupational Therapy Session Note  Patient Details  Name: Stephanie Love MRN: [id number] Date of Birth: [birth year]  Today's Date: 11/24/2018 OT Individual Time: 2423-5361 OT Individual Time Calculation (min): 75 min   Session 2: OT Individual Time: 1400-1500 OT Individual Time Calculation (min): 60 min   Short Term Goals: Week 1:  OT Short Term Goal 1 (Week 1): Pt will transfer on and off toilet with min A. OT Short Term Goal 2 (Week 1): Pt will transfer on and off shower bench with min A. OT Short Term Goal 3 (Week 1): Pt will be able to stand with min A or less during LB self care.  Skilled Therapeutic Interventions/Progress Updates:    Pt received supine with no c/o pain. Pt completed ambulatory transfer into bathroom with RW, CGA with no LOB. Pt completed toileting tasks with close (S), using grab bar. Pt was able to wash up from toilet with CGA. Pt stood at sink and completed oral hygiene with close (S). Pt required seated rest break and then completed 75 ft of functional mobility to therapy mat. Pt completed dynamic balance activity in standing with BUE support on RW. CGA- min A provided during single leg stance. Pt then transitioned into quadroped on mat with min A. From here she completed functional forward reaching and BLE kickbacks to challenge core stabilization and LUE/LE weight bearing. Pt returned to room and was left supine with all needs met.   Session 2: Session focused on dynamic standing balance and fall risk reduction. Pt received in bed with no c/o pain. Pt completed functional mobility out of room, 75 ft to therapy mat with CGA using RW. Pt completed closed chain BUE straight arm raises and chest press in standing to challenge standing balance and for LUE NMR. Pt then completed functional stepping and turning in small spaces with RW to increase RW management and safety. To improve core stabilization and balance, pt sat on therapy ball with min stabilization provided  posteriorly and completed LUE NMR task of [place] and forward reaching. Next, pt completed simulated IADL task of functional reaching in standing without UE support, CGA provided throughout. Discussed d/c planning throughout session, including DME to be ordered- and these items communicated to CSW. Pt returned to her room and was left supine with all needs met, bed alarm set.   Therapy Documentation Precautions:  Precautions Precautions: Fall Precaution Comments: ataxia Restrictions Weight Bearing Restrictions: No   Therapy/Group: Individual Therapy  Curtis Sites 11/24/2018, 7:19 AM

## 2018-11-24 NOTE — Patient Care Conference (Signed)
Inpatient RehabilitationTeam Conference and Plan of Care Update Date: 11/24/2018   Time: 1:55 PM    Patient Name: Stephanie Love      Medical Record Number: [id number]  Date of Birth: Feb 04, 1936 Sex: Female         Room/Bed: 5N17C/5N17C-01 Payor Info: Payor: MEDICARE / Plan: MEDICARE PART A AND B / Product Type: *No Product type* /    Admitting Diagnosis: NT Team  B CVA, 13-15days  Admit Date/Time:  11/17/2018  1:59 PM Admission Comments: No comment available   Primary Diagnosis:  <principal problem not specified> Principal Problem: <principal problem not specified>  Patient Active Problem List   Diagnosis Date Noted  . Iron deficiency anemia   . Slow transit constipation   . Benign essential HTN   . Hypoalbuminemia due to protein-calorie malnutrition ([place])   . Prediabetes   . Embolic stroke ([place]) 66/44/0347  . Essential hypertension   . Non-intractable vomiting   . Ataxia   . 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 COPD exacerbation ([place])   . S/P admn tPA in diff fac w/n last 24 hr bef adm to crnt fac   . Anemia of chronic disease   . Urinary retention   . Hypothyroidism   . Chronic obstructive pulmonary disease ([place])   . Dyslipidemia   . Ischemic stroke ([place]) 11/14/2018  . Hyperlipidemia 08/10/2018  . Aftercare following surgery 05/06/2016  . Hammer toe of right foot 09/20/2015    Expected Discharge Date: Expected Discharge Date: 11/30/18  Team Members Present: Physician leading conference: Dr. Delice Lesch Social Worker Present: Ovidio Kin, LCSW Nurse Present: Leonette Nutting, RN PT Present: Roderic Ovens, PT OT Present: Other (comment)(Sandra Davis-OT) SLP Present: Stormy Fabian, SLP PPS Coordinator present : Ileana Ladd, PT     Current Status/Progress Goal Weekly Team Focus  Medical   Weakness, balance deficits with posterior lean secondary to bilateral infarcts as well as right brain bleeds.  Improve mobility, endurance, urinary  retention, ABLA, constipation  See above   Bowel/Bladder   Continent of bladder/bowel LBM 11/24/18,', Constipation verbalixed started on laxative Midalax and stool softener po  Maintain regularity   Assess QS/PRN provide meds as order, bowel program initiated   Swallow/Nutrition/ Hydration             ADL's   CGA LB dressing/bathing, (S) UB dressing/bathing, CGA-min A transfers- still has L LOB with functional mobility  Supervision ADLs and transfers  ADL transfers, ADL retraining, d/c planning, LUE NMR, gaze stabilization to assist with nausea   Mobility   min A transfers, min to CGA w/ RW ambulation 150', stairs min A with rails, feels weak, easily fatigues, occasional N&V  S overall, min A floor transfers  upright orientation, cervical & LE strengthening, balance, gait, endurance, vestibular habituation   Communication             Safety/Cognition/ Behavioral Observations            Pain   No pain   < 2  Assess and monitor pain/discomfort, Evaluate   Skin   S/P Loop recordr site left lateral chest region old drainage, denies discomfort    no evidence of infection  QS/PRN assessment of site note complcation and notify MD/PA      *See Care Plan and progress notes for long and short-term goals.     Barriers to Discharge  Current Status/Progress Possible Resolutions Date Resolved   Physician    Medical stability  See above  Therapies, follow PVRs, follow labs, iron infusion, bowel meds      Nursing                  PT                    OT                  SLP                SW                Discharge Planning/Teaching Needs:  Home with family coming in to assist her. Doing well in her therapies and very motivated to make progress.      Team Discussion:  Goals supervision level, currently min assist level. Nausea and dizziness issues form low hemoglobin. PVR's good and can stop checking them. Vestibular issues. Will need 24 hr supervision at DC. Will need family  education prior to DC home.  Revisions to Treatment Plan:  DC 6/9    Continued Need for Acute Rehabilitation Level of Care: The patient requires daily medical management by a physician with specialized training in physical medicine and rehabilitation for the following conditions: Daily direction of a multidisciplinary physical rehabilitation program to ensure safe treatment while eliciting the highest outcome that is of practical value to the patient.: Yes Daily medical management of patient stability for increased activity during participation in an intensive rehabilitation regime.: Yes Daily analysis of laboratory values and/or radiology reports with any subsequent need for medication adjustment of medical intervention for : Neurological problems;Other   I attest that I was present, lead the team conference, and concur with the assessment and plan of the team. Teleconference held due to COVID 19   Alivia Cimino, Gardiner Rhyme 11/24/2018, 1:55 PM

## 2018-11-24 NOTE — Progress Notes (Signed)
Occupational Therapy Weekly Progress Note  Patient Details  Name: Stephanie Love MRN: [id number] Date of Birth: Aug 22, 1935  Beginning of progress report period: Nov 18, 2018 End of progress report period: November 24, 2018   Patient has met 3 of 3 short term goals.  Pt has made excellent progress toward her OT POC. Pt has increased the active, controlled movement of her L UE and is able to functionally use this arm as a stabilizer during ADL tasks. Pt can don UB/LB clothing with CGA and is demonstrating carryover of fall risk reduction strategies. Pt continues to be motivated and participates fully in each session.   Patient continues to demonstrate the following deficits: muscle weakness, decreased coordination, decreased problem solving and decreased safety awareness and decreased sitting balance, decreased standing balance, decreased postural control, hemiplegia and decreased balance strategies and therefore will continue to benefit from skilled OT intervention to enhance overall performance with BADL and iADL.  Patient progressing toward long term goals..  Continue plan of care.  OT Short Term Goals Week 1:  OT Short Term Goal 1 (Week 1): Pt will transfer on and off toilet with min A. OT Short Term Goal 1 - Progress (Week 1): Met OT Short Term Goal 2 (Week 1): Pt will transfer on and off shower bench with min A. OT Short Term Goal 2 - Progress (Week 1): Met OT Short Term Goal 3 (Week 1): Pt will be able to stand with min A or less during LB self care. OT Short Term Goal 3 - Progress (Week 1): Met Week 2:  OT Short Term Goal 1 (Week 2): STG=LTG d/t ELOS   Therapy Documentation Precautions:  Precautions Precautions: Fall Precaution Comments: ataxia Restrictions Weight Bearing Restrictions: No   Curtis Sites 11/24/2018, 5:01 PM

## 2018-11-24 NOTE — Progress Notes (Signed)
Physical Therapy Session Note  Patient Details  Name: Stephanie Love MRN: [id number] Date of Birth: 06-Jan-1936  Today's Date: 11/24/2018 PT Individual Time: 1100-1156 PT Individual Time Calculation (min): 56 min   Short Term Goals: Week 1:  PT Short Term Goal 1 (Week 1): Patient to perform transfers consistently min A. PT Short Term Goal 2 (Week 1): Patient to ambulate 57' with LRAD and min A PT Short Term Goal 3 (Week 1): Patient to demonstrate standing balance wtih 1 UE support and S for functional task PT Short Term Goal 4 (Week 1): Patient to negotiate stairs x 4 with min A with rails  Skilled Therapeutic Interventions/Progress Updates:  Pt in bed asleep in bed. Awoke easily and agreed to PT at this time.  Supervision for supine to sitting edge of bed. While seated edge of bed pt was able to doff socks and don shoes (slip on shoes). Min guard assist for standing from bed to RW. Gait from room to NuStep in hallway. Cues for posture and mod cues for increased step length with gait as pt with short, shuffled steps. NuStep with bil UE/LE's at level 5.0 with goal >/=30 steps per minute for strengthening and activity tolerance. Min guard to stand to RW and for gait from NuStep to mat in hallway in opposite corner with cues as stated with gait. At mat worked on balance/coordination/NMR as follows- with large pball had pt rolling the ball forward while lifting bottom of mat for partial stand promoting increased anterior weight shifting and decreased recurvatum for 2 sets of 10 reps with min guard assist; standing with HHA pt preformed alternating forward toe taps, then alternating cross toe taps to tall cones with min/mod assist for balance, cues on stance position and weight shifting with task. For strengthening with pt seated at edge of mat- with 3# ankle weight long arc quads x 20 reps each leg, then hamstring curls with level 2 resistance band for 20 reps each leg. Pt needed min guard assist for all  sit<>stand transfers with balance activities and after strengthening ex's for return to room with cues each time for hand placement for safety and increased anterior weight shifting. Gait back to room (90 feet) with RW with min guard assist and continued cues for increased step/stride length with good return noted for last half of gait. Min guard assist with cues for safety to sit in recliner in room.  Pt left in recliner with feet up, alarm set and all needs in reach. Mild dizziness reported once with session with forward toe taps that resolved with increased reps/time. Pt continues to need cues for general safety with mobilty, for instance twice with session she let go of walker before at destination to safely sit down (parked it) needing cues to keep it with her.   Therapy Documentation Precautions:  Precautions Precautions: Fall Precaution Comments: ataxia Restrictions Weight Bearing Restrictions: No    Therapy/Group: Individual Therapy  Willow Ora, PTA 11/24/2018, 12:20 PM

## 2018-11-24 NOTE — Progress Notes (Signed)
Social Work Patient ID: Stephanie Love, female   DOB: 1936/03/02, 83 y.o.   MRN: [id number] Met with pt and left message for daughter to inform of team conference goals supervision level and discharge date 6/9. Pt feels her nausea and neck hurting is her main issue and it comes and goes. Will talk with daughter and have come in for education prior to pt's discharge. Await return call.

## 2018-11-24 NOTE — Progress Notes (Signed)
Unless return of the weather-#4 adenoiditis [place] PHYSICAL MEDICINE & REHABILITATION PROGRESS NOTE  Subjective/Complaints: Patient seen laying in bed this AM.  She states she slept well overnight.  She states she is doing well.   ROS: Denies CP, shortness of breath, nausea, vomiting, diarrhea.  Objective: Vital Signs: Blood pressure 114/61, pulse 88, temperature 98.2 F (36.8 C), temperature source Oral, resp. rate 20, height 5\' 2"  (1.575 m), weight 59 kg, SpO2 99 %. No results found. Recent Labs    11/22/18 1104  WBC 5.5  HGB 7.4*  HCT 26.9*  PLT 272   Recent Labs    11/24/18 0510  NA 137  K 4.1  CL 107  CO2 22  GLUCOSE 87  BUN 12  CREATININE 0.90  CALCIUM 9.6    Physical Exam: BP 114/61 (BP Location: Left Arm)   Pulse 88   Temp 98.2 F (36.8 C) (Oral)   Resp 20   Ht 5\' 2"  (1.575 m)   Wt 59 kg   SpO2 99%   BMI 23.79 kg/m  Constitutional: No distress . Vital signs reviewed. HENT: Normocephalic. Atraumatic. Eyes: EOMI. No discharge. Cardiovascular: No JVD. Respiratory: Normal effort. GI: Non-distended. Musc: No edema or tenderness in extremities. Neurological: Alert and oriented. Motor: Right upper extremity/right lower extremity: 5/5 proximal distal, unchanged Left upper extremity/left lower extremity: 4-4+/5 proximal distal, unchanged HOH Skin: Skin is warm and dry.  Psychiatric: She has a normal mood and affect. Her behavior is normal.   Assessment/Plan: 1. Functional deficits secondary to bilateral brain infarcts as well as right brain bleeds which require 3+ hours per day of interdisciplinary therapy in a comprehensive inpatient rehab setting.  Physiatrist is providing close team supervision and 24 hour management of active medical problems listed below.  Physiatrist and rehab team continue to assess barriers to discharge/monitor patient progress toward functional and medical goals  Care Tool:  Bathing    Body parts bathed by patient:  Right arm, Left arm, Chest, Abdomen, Front perineal area, Buttocks, Right upper leg, Left upper leg, Right lower leg, Left lower leg, Face         Bathing assist Assist Level: Contact Guard/Touching assist     Upper Body Dressing/Undressing Upper body dressing   What is the patient wearing?: Bra, Pull over shirt    Upper body assist Assist Level: Set up assist    Lower Body Dressing/Undressing Lower body dressing      What is the patient wearing?: Underwear/pull up, Pants     Lower body assist Assist for lower body dressing: Contact Guard/Touching assist     Toileting Toileting    Toileting assist Assist for toileting: Contact Guard/Touching assist     Transfers Chair/bed transfer  Transfers assist     Chair/bed transfer assist level: Minimal Assistance - Patient > 75%     Locomotion Ambulation   Ambulation assist      Assist level: Minimal Assistance - Patient > 75% Assistive device: Walker-rolling Max distance: 90   Walk 10 feet activity   Assist     Assist level: Minimal Assistance - Patient > 75% Assistive device: Walker-rolling   Walk 50 feet activity   Assist Walk 50 feet with 2 turns activity did not occur: Safety/medical concerns  Assist level: Minimal Assistance - Patient > 75% Assistive device: Walker-rolling    Walk 150 feet activity   Assist Walk 150 feet activity did not occur: Safety/medical concerns  Assist level: Minimal Assistance - Patient > 75% Assistive device:  Walker-rolling    Walk 10 feet on uneven surface  activity   Assist Walk 10 feet on uneven surfaces activity did not occur: Safety/medical concerns         Wheelchair     Assist Will patient use wheelchair at discharge?: No Type of Wheelchair: Manual    Wheelchair assist level: Minimal Assistance - Patient > 75% Max wheelchair distance: 68'    Wheelchair 50 feet with 2 turns activity    Assist        Assist Level: Minimal  Assistance - Patient > 75%   Wheelchair 150 feet activity     Assist Wheelchair 150 feet activity did not occur: Safety/medical concerns          Medical Problem List and Plan: 1.  Weakness, balance deficits with posterior lean secondary to bilateral infarcts as well as right brain bleeds.  Continue CIR  Team conference today to discuss current and goals and coordination of care, home and environmental barriers, and discharge planning with nursing, case manager, and therapies.  2.  Antithrombotics: -DVT/anticoagulation:  Mechanical: Sequential compression devices, below knee Bilateral lower extremities             -antiplatelet therapy: ASA 3. Pain Management: Tylenol prn.  4. Mood: LCSW to follow for evaluation and support.              -antipsychotic agents: N/A 5. Neuropsych: This patient is capable of making decisions on her own behalf. 6. Skin/Wound Care: Routine pressure relief measures.  7. Fluids/Electrolytes/Nutrition: Routine I/Os.    BMP within acceptable range on 5/27 8. HTN: Monitor BP bid. On Cozaar PTA.  Cont Metoprolol  Controlled on 6/3  Monitor with increased mobility 9.  Dyslipidemia: No meds due to NASH  10. COPD: Continue Daliresp and Incruse daily.   11. Hypothyroid: On supplement.  12.Transient urinary retention:     PVRs showing borderline retention, unchanged 13.H/o anemia:   Hemoglobin 7.4 on 6/1  Iron with Vit C ordered on 6/2  Will plan for iron transfusion  Continue to monitor 14.  Prediabetes  Continue to monitor  Relatively controlled on 6/3 15.  Hypoalbuminemia  Supplement initiated on 5/28 16.  Constipation  Bowel regimen creased on 6/2  LOS: 7 days A FACE TO FACE EVALUATION WAS PERFORMED  Ankit Lorie Phenix 11/24/2018, 2:28 PM

## 2018-11-25 ENCOUNTER — Inpatient Hospital Stay (HOSPITAL_COMMUNITY): Payer: Medicare Other

## 2018-11-25 ENCOUNTER — Ambulatory Visit: Payer: Medicare Other

## 2018-11-25 DIAGNOSIS — I6622 Occlusion and stenosis of left posterior cerebral artery: Secondary | ICD-10-CM

## 2018-11-25 DIAGNOSIS — I959 Hypotension, unspecified: Secondary | ICD-10-CM

## 2018-11-25 DIAGNOSIS — I63119 Cerebral infarction due to embolism of unspecified vertebral artery: Secondary | ICD-10-CM

## 2018-11-25 DIAGNOSIS — I9589 Other hypotension: Secondary | ICD-10-CM

## 2018-11-25 LAB — FERRITIN: Ferritin: 25 ng/mL (ref 11–307)

## 2018-11-25 LAB — RETICULOCYTES
Immature Retic Fract: 33.6 % — ABNORMAL HIGH (ref 2.3–15.9)
RBC.: 3.97 MIL/uL (ref 3.87–5.11)
Retic Count, Absolute: 103.2 10*3/uL (ref 19.0–186.0)
Retic Ct Pct: 2.6 % (ref 0.4–3.1)

## 2018-11-25 LAB — GLUCOSE, CAPILLARY
Glucose-Capillary: 86 mg/dL (ref 70–99)
Glucose-Capillary: 94 mg/dL (ref 70–99)
Glucose-Capillary: 133 mg/dL — ABNORMAL HIGH (ref 70–99)
Glucose-Capillary: 97 mg/dL (ref 70–99)

## 2018-11-25 LAB — VITAMIN B12: Vitamin B-12: 540 pg/mL (ref 180–914)

## 2018-11-25 LAB — IRON AND TIBC
Iron: 61 ug/dL (ref 28–170)
Saturation Ratios: 13 % (ref 10.4–31.8)
TIBC: 482 ug/dL — ABNORMAL HIGH (ref 250–450)
UIBC: 421 ug/dL

## 2018-11-25 LAB — FOLATE: Folate: 12 ng/mL (ref >5.9)

## 2018-11-25 MED ORDER — SODIUM CHLORIDE 0.9 % IV SOLN
510.0000 mg | Freq: Once | INTRAVENOUS | Status: AC
  Administered 2018-11-25: 510 mg via INTRAVENOUS
  Filled 2018-11-25: qty 17

## 2018-11-25 MED ORDER — MUSCLE RUB 10-15 % EX CREA: 1.0000 "application " | TOPICAL_CREAM | Freq: Three times a day (TID) | CUTANEOUS | 0 refills | Status: DC

## 2018-11-25 MED ORDER — ASCORBIC ACID 500 MG PO TABS: 500.0000 mg | ORAL_TABLET | Freq: Two times a day (BID) | ORAL | Status: DC

## 2018-11-25 MED ORDER — FERROUS SULFATE 325 (65 FE) MG PO TABS
325.0000 mg | ORAL_TABLET | Freq: Every day | ORAL | Status: DC
  Administered 2018-11-26: 325 mg via ORAL
  Filled 2018-11-25 (×2): qty 1

## 2018-11-25 NOTE — Progress Notes (Signed)
Physical Therapy Weekly Progress Note  Patient Details  Name: Stephanie Love MRN: [id number] Date of Birth: 29-Apr-1936  Beginning of progress report period: Nov 18, 2018 End of progress report period: November 25, 2018  Today's Date: 11/25/2018 PT Individual Time: 0830-0930 PT Individual Time Calculation (min): 60 min   Patient has met 4 of 4 short term goals.  Patient progressing well and able to achieve STG's.  Remains limited by nausea and heavy headedness with cerebellar symptoms and L side incoordination  Patient continues to demonstrate the following deficits impaired timing and sequencing and ataxia and central origin and therefore will continue to benefit from skilled PT intervention to increase functional independence with mobility.  Patient progressing toward long term goals..  Continue plan of care.  PT Short Term Goals Week 1:  PT Short Term Goal 1 (Week 1): Patient to perform transfers consistently min A. PT Short Term Goal 1 - Progress (Week 1): Met PT Short Term Goal 2 (Week 1): Patient to ambulate 50' with LRAD and min A PT Short Term Goal 2 - Progress (Week 1): Met PT Short Term Goal 3 (Week 1): Patient to demonstrate standing balance wtih 1 UE support and S for functional task PT Short Term Goal 3 - Progress (Week 1): Met PT Short Term Goal 4 (Week 1): Patient to negotiate stairs x 4 with min A with rails PT Short Term Goal 4 - Progress (Week 1): Met Week 2:  PT Short Term Goal 1 (Week 2): STG=LTG due to ELOS  Skilled Therapeutic Interventions/Progress Updates:    Patient in supine reports awoke with nausea and dry heaves at 1 am and did not get medication for some time.  Feeling fatigued and with continued heavy headedness and neck fatigue.  Performed supine to sit with S.  Able to don pants with S in supine.  Performed sit to stand and pivot to w/c CGA with RW.  Propelled w/c 8' with S and cues.  Patient sit to stand to walker and ambulated with CGA x 180' with RW.   Patient educated about rollator and benefits of having seat if nauseated or fatigued.  Practiced with rollator on unit x about 60' including locking and unlocking brakes and pushing against wall to sit.  Patient interested in one for home.  Negotiated 5 steps with rails and CGA to min A.  Patient assisted in w/c to room and stand step to bed with CGA with RW and left with call bell in reach and bed alarm activated.   Therapy Documentation Precautions:  Precautions Precautions: Fall Precaution Comments: ataxia Restrictions Weight Bearing Restrictions: No Pain: Pain Assessment Pain Scale: 0-10 Pain Score: 0-No pain   Therapy/Group: Individual Therapy  Reginia Naas  Magda Kiel, PT 11/25/2018, 5:12 PM

## 2018-11-25 NOTE — Progress Notes (Signed)
[place] PHYSICAL MEDICINE & REHABILITATION PROGRESS NOTE  Subjective/Complaints: Patient seen sitting up in bed this morning.  She states she slept fairly overnight due to an upset stomach.  She believes it is iron.  ROS: + Upset stomach, nausea.  Denies CP, shortness of breath, diarrhea.  Objective: Vital Signs: Blood pressure (!) 112/58, pulse 92, temperature 98.6 F (37 C), temperature source Oral, resp. rate 18, height 5\' 2"  (1.575 m), weight 59 kg, SpO2 98 %. No results found. No results for input(s): WBC, HGB, HCT, PLT in the last 72 hours. Recent Labs    11/24/18 0510  NA 137  K 4.1  CL 107  CO2 22  GLUCOSE 87  BUN 12  CREATININE 0.90  CALCIUM 9.6    Physical Exam: BP (!) 112/58   Pulse 92   Temp 98.6 F (37 C) (Oral)   Resp 18   Ht 5\' 2"  (1.575 m)   Wt 59 kg   SpO2 98%   BMI 23.79 kg/m  Constitutional: No distress . Vital signs reviewed. HENT: Normocephalic.  Atraumatic. Eyes: EOMI.  No discharge. Cardiovascular: No JVD. Respiratory: Normal effort. GI: Non-distended. Musc: No edema or tenderness in extremities. Neurological: Alert and oriented. Motor: Right upper extremity/right lower extremity: 5/5 proximal distal, stable Left upper extremity/left lower extremity: 4-4+/5 proximal distal, stable HOH Skin: Skin is warm and dry.  Psychiatric: She has a normal mood and affect. Her behavior is normal.   Assessment/Plan: 1. Functional deficits secondary to bilateral brain infarcts as well as right brain bleeds which require 3+ hours per day of interdisciplinary therapy in a comprehensive inpatient rehab setting.  Physiatrist is providing close team supervision and 24 hour management of active medical problems listed below.  Physiatrist and rehab team continue to assess barriers to discharge/monitor patient progress toward functional and medical goals  Care Tool:  Bathing    Body parts bathed by patient: Right arm, Left arm, Chest, Abdomen, Front  perineal area, Buttocks, Right upper leg, Left upper leg, Right lower leg, Left lower leg, Face         Bathing assist Assist Level: Contact Guard/Touching assist     Upper Body Dressing/Undressing Upper body dressing   What is the patient wearing?: Bra, Pull over shirt    Upper body assist Assist Level: Set up assist    Lower Body Dressing/Undressing Lower body dressing      What is the patient wearing?: Underwear/pull up, Pants     Lower body assist Assist for lower body dressing: Contact Guard/Touching assist     Toileting Toileting    Toileting assist Assist for toileting: Contact Guard/Touching assist     Transfers Chair/bed transfer  Transfers assist     Chair/bed transfer assist level: Minimal Assistance - Patient > 75%     Locomotion Ambulation   Ambulation assist      Assist level: Minimal Assistance - Patient > 75% Assistive device: Walker-rolling Max distance: 90   Walk 10 feet activity   Assist     Assist level: Minimal Assistance - Patient > 75% Assistive device: Walker-rolling   Walk 50 feet activity   Assist Walk 50 feet with 2 turns activity did not occur: Safety/medical concerns  Assist level: Minimal Assistance - Patient > 75% Assistive device: Walker-rolling    Walk 150 feet activity   Assist Walk 150 feet activity did not occur: Safety/medical concerns  Assist level: Minimal Assistance - Patient > 75% Assistive device: Walker-rolling    Walk 10 feet  on uneven surface  activity   Assist Walk 10 feet on uneven surfaces activity did not occur: Safety/medical concerns         Wheelchair     Assist Will patient use wheelchair at discharge?: No Type of Wheelchair: Manual    Wheelchair assist level: Minimal Assistance - Patient > 75% Max wheelchair distance: 72'    Wheelchair 50 feet with 2 turns activity    Assist        Assist Level: Minimal Assistance - Patient > 75%   Wheelchair 150 feet  activity     Assist Wheelchair 150 feet activity did not occur: Safety/medical concerns          Medical Problem List and Plan: 1.  Weakness, balance deficits with posterior lean secondary to bilateral infarcts as well as right brain bleeds.  Continue CIR 2.  Antithrombotics: -DVT/anticoagulation:  Mechanical: Sequential compression devices, below knee Bilateral lower extremities             -antiplatelet therapy: ASA 3. Pain Management: Tylenol prn.  4. Mood: LCSW to follow for evaluation and support.              -antipsychotic agents: N/A 5. Neuropsych: This patient is capable of making decisions on her own behalf. 6. Skin/Wound Care: Routine pressure relief measures.  7. Fluids/Electrolytes/Nutrition: Routine I/Os.    BMP within acceptable range on 5/27 8. HTN: Monitor BP bid. On Cozaar PTA.  Cont Metoprolol  Soft on 6/4, likely related to intake and nausea  Monitor with increased mobility 9.  Dyslipidemia: No meds due to NASH  10. COPD: Continue Daliresp and Incruse daily.   11. Hypothyroid: On supplement.  12.Transient urinary retention:     PVRs showing borderline retention, unchanged 13.H/o anemia:   Hemoglobin 7.4 on 6/1  Iron with Vit C ordered on 6/2, decreased to daily on 6/4 due to nausea  Aranesp ordered  Continue to monitor 14.  Prediabetes  Continue to monitor  Control on 6/4 15.  Hypoalbuminemia  Supplement initiated on 5/28 16.  Constipation  Bowel regimen creased on 6/2  LOS: 8 days A FACE TO FACE EVALUATION WAS PERFORMED  Ankit Lorie Phenix 11/25/2018, 1:13 PM

## 2018-11-25 NOTE — Progress Notes (Signed)
Occupational Therapy Session Note  Patient Details  Name: Stephanie Love MRN: [id number] Date of Birth: 1935/10/30  Today's Date: 11/25/2018 OT Individual Time: 1445-1600 OT Individual Time Calculation (min): 75 min    Short Term Goals: Week 2:  OT Short Term Goal 1 (Week 2): STG=LTG d/t ELOS  Skilled Therapeutic Interventions/Progress Updates:    Patient agreeable to participate in OT treatment session this afternoon. Completed functional transfer from bed to Wheelchair with Min assist stand pivot (declined use of walker). Completed NuStep for 10' to increase BUE strength, endurance, and activity tolerance. Pt required a lengthy rest break due to feeling dizzy. Once in her wheelchair, she completed BUE strengthening using orange theraband. Shoulder and elbow ranges completed; 10X; 1 set each with rest breaks as needed. At end of session patient transferred back to bed from wheelchair utilizing RW with min assist. Supervision for bed mobility. Pt was left in bed with call light with in reach and bed alarm on.    Therapy Documentation Precautions:  Precautions Precautions: Fall Precaution Comments: ataxia Restrictions Weight Bearing Restrictions: No Pain: Pain Assessment Pain Scale: 0-10 Pain Score: 0-No pain  Therapy/Group: Individual Therapy   Ailene Ravel, OTR/L,CBIS  [phone]  11/25/2018, 4:40 PM

## 2018-11-25 NOTE — Progress Notes (Signed)
Occupational Therapy Session Note  Patient Details  Name: Stephanie Love MRN: [id number] Date of Birth: March 19, 1936  Today's Date: 11/25/2018 OT Individual Time: 1030-1130 OT Individual Time Calculation (min): 60 min    Short Term Goals: Week 1:  OT Short Term Goal 1 (Week 1): Pt will transfer on and off toilet with min A. OT Short Term Goal 1 - Progress (Week 1): Met OT Short Term Goal 2 (Week 1): Pt will transfer on and off shower bench with min A. OT Short Term Goal 2 - Progress (Week 1): Met OT Short Term Goal 3 (Week 1): Pt will be able to stand with min A or less during LB self care. OT Short Term Goal 3 - Progress (Week 1): Met  Skilled Therapeutic Interventions/Progress Updates:    Patient in bed upon therapy arrival and agreeable to take a shower. Patient completed bed mobility independently. Supervision provided for sit to stand with RW. Patient then retrieved clothing from closet and placed on bed. Pt then ambulated to shower and set on bench to complete UB bathing and LB bathing with supervision. Pt used grab bar to stand and wash peri area and buttocks. Once finished and dry, patient ambulated using RW and supervision from bathroom to bed. Sitting on EOB she completed dressing tasks at supervision level. Patient with a few episodes of LOB while standing to pull pants up due to back of legs pushing back against bed. Patient was able to sit down instantly and repeat task safely. Patient was left in bed with call light within reach at end of session.   Therapy Documentation Precautions:  Precautions Precautions: Fall Precaution Comments: ataxia Restrictions Weight Bearing Restrictions: No Pain: Pain Assessment Pain Scale: 0-10 Pain Score: 0-No pain  Therapy/Group: Individual Therapy   Ailene Ravel, OTR/L,CBIS  [phone]  11/25/2018, 1:23 PM

## 2018-11-26 ENCOUNTER — Inpatient Hospital Stay (HOSPITAL_COMMUNITY): Payer: Medicare Other | Admitting: Physical Therapy

## 2018-11-26 ENCOUNTER — Inpatient Hospital Stay (HOSPITAL_COMMUNITY): Payer: Medicare Other

## 2018-11-26 ENCOUNTER — Inpatient Hospital Stay (HOSPITAL_COMMUNITY): Payer: Medicare Other | Admitting: Occupational Therapy

## 2018-11-26 DIAGNOSIS — R0989 Other specified symptoms and signs involving the circulatory and respiratory systems: Secondary | ICD-10-CM

## 2018-11-26 LAB — GLUCOSE, CAPILLARY
Glucose-Capillary: 105 mg/dL — ABNORMAL HIGH (ref 70–99)
Glucose-Capillary: 108 mg/dL — ABNORMAL HIGH (ref 70–99)
Glucose-Capillary: 120 mg/dL — ABNORMAL HIGH (ref 70–99)
Glucose-Capillary: 109 mg/dL — ABNORMAL HIGH (ref 70–99)

## 2018-11-26 MED ORDER — NYSTATIN 100000 UNIT/GM EX POWD
Freq: Three times a day (TID) | CUTANEOUS | Status: DC
  Administered 2018-11-26 – 2018-11-30 (×11): via TOPICAL
  Filled 2018-11-26: qty 15

## 2018-11-26 NOTE — Progress Notes (Signed)
Occupational Therapy Session Note  Patient Details  Name: Stephanie Love MRN: [id number] Date of Birth: [birth year]  Today's Date: 11/26/2018 OT Individual Time: 4917-9150 OT Individual Time Calculation (min): 42 min    Short Term Goals: Week 1:  OT Short Term Goal 1 (Week 1): Pt will transfer on and off toilet with min A. OT Short Term Goal 1 - Progress (Week 1): Met OT Short Term Goal 2 (Week 1): Pt will transfer on and off shower bench with min A. OT Short Term Goal 2 - Progress (Week 1): Met OT Short Term Goal 3 (Week 1): Pt will be able to stand with min A or less during LB self care. OT Short Term Goal 3 - Progress (Week 1): Met  Skilled Therapeutic Interventions/Progress Updates:    1;1. Pt received in bed agreeable to OT with no pain reported after max encouragemetn to participate despite not having breakfast yet. Pt very verbose throughout session requiring VC for attention totasks. Pt completes stand pivot transfer with CGA EOB<>w/c<>nustep<>EOM with VC for hand placement and gaze stabilization. Pt completes 10 min on Nustep with VC for attention to going past designated 8 min with BLE use only on level 4 workload. Pt completes standing ball toss activity with S-intermittently MIN A for standing balance/coordination of BUE. Exited session with pt seated in recliner,, exit alarm on, call light in reach and all eneds met.  Therapy Documentation Precautions:  Precautions Precautions: Fall Precaution Comments: ataxia Restrictions Weight Bearing Restrictions: No General:   Vital Signs:  Pain:   ADL: ADL ADL Comments: Pt able to bathe and dress herself but needs mod A with transfers and standing balance. Vision   Perception    Praxis   Exercises:   Other Treatments:     Therapy/Group: Individual Therapy  Tonny Branch 11/26/2018, 8:13 AM

## 2018-11-26 NOTE — Plan of Care (Signed)
  Problem: RH BLADDER ELIMINATION Goal: RH STG MANAGE BLADDER WITH ASSISTANCE Description STG Manage Bladder With Mod I Assistance   Outcome: Progressing Flowsheets (Taken 11/26/2018 1751) STG: Pt will manage bladder with assistance: 4-Minimal assistance   Problem: RH SKIN INTEGRITY Goal: RH STG SKIN FREE OF INFECTION/BREAKDOWN Description No new breakdown with Mod I assist  Outcome: Progressing Goal: RH STG MAINTAIN SKIN INTEGRITY WITH ASSISTANCE Description STG Maintain Skin Integrity With Mod I Assistance.  Outcome: Progressing Flowsheets (Taken 11/26/2018 1751) STG: Maintain skin integrity with assistance: 4-Minimal assistance   Problem: RH SAFETY Goal: RH STG ADHERE TO SAFETY PRECAUTIONS W/ASSISTANCE/DEVICE Description STG Adhere to Safety Precautions With Min Assistance/Device.  Outcome: Progressing Flowsheets (Taken 11/26/2018 1751) STG:Pt will adhere to safety precautions with assistance/device: 4-Minimal assistance   Problem: RH PAIN MANAGEMENT Goal: RH STG PAIN MANAGED AT OR BELOW PT'S PAIN GOAL Description Pain free or pain less than 3  Outcome: Progressing   Problem: RH KNOWLEDGE DEFICIT Goal: RH STG INCREASE KNOWLEDGE OF STROKE PROPHYLAXIS Description Pt will be able to verbalize proper management of stoke prevention including managing HTN, Blood Sugars, and follow up care with Cardiologist with min assist/cues.   Outcome: Progressing   Problem: Consults Goal: RH STROKE PATIENT EDUCATION Description See Patient Education module for education specifics  Outcome: Progressing

## 2018-11-26 NOTE — Progress Notes (Addendum)
Social Work Patient ID: Stephanie Love, female   DOB: [birth year], 83 y.o.   MRN: [id number] have left two messages for daughter regarding pt's goals and discharge date. Also the need for education prior to discharge home. Will await her return call. Spoke with daughter-Sherry who can be here Tuesday 6/9 @ 9:00 am and then take pt home. Will let team know of plans.

## 2018-11-26 NOTE — Progress Notes (Signed)
Physical Therapy Session Note  Patient Details  Name: Stephanie Love MRN: [id number] Date of Birth: 1935/06/30  Today's Date: 11/26/2018 PT Individual Time: 1110-1200 PT Individual Time Calculation (min): 50 min   Short Term Goals: Week 2:  PT Short Term Goal 1 (Week 2): STG=LTG due to ELOS  Skilled Therapeutic Interventions/Progress Updates:    pt performs gait with RW 150', 50' with CGA during session. 1 episode of dizziness requiring quickly sitting down.  Discussed safety at home with pt and limiting far walks in favor of frequent shorter walks to prevent fatigue.  Gait training with rollator 150' x 2 with pt with improved RW control and mobility, continues to require CGA for turning to sit and cues for brakes.  Pt performs tap ups with 1 UE support, min A for strength and balance. Pt performs stair negotiation 2 x 5 steps with bilat handrails, CGA. Pt left in bed with all needs at hand, alarm set.  Therapy Documentation Precautions:  Precautions Precautions: Fall Precaution Comments: ataxia Restrictions Weight Bearing Restrictions: No Pain: Pain Assessment Pain Scale: 0-10 Pain Score: 0-No pain    Therapy/Group: Individual Therapy  Beckett Hickmon 11/26/2018, 12:22 PM

## 2018-11-26 NOTE — Progress Notes (Signed)
Physical Therapy Session Note  Patient Details  Name: Stephanie Love MRN: [id number] Date of Birth: 1936/03/20  Today's Date: 11/26/2018 PT Individual Time: 9628-3662 PT Individual Time Calculation (min): 32 min   Short Term Goals: Week 2:  PT Short Term Goal 1 (Week 2): STG=LTG due to ELOS  Skilled Therapeutic Interventions/Progress Updates:  Pt received asleep in bed but awakened & agreeable to tx. Pt denies c/o pain but reports her head/neck feel heavy & she is dizzy. Pt transfers to sitting EOB with supervision & completes Berg Balance Test scoring 23/56; educated pt on interpretation of score, current fall risk, and safety recommendations (use AD, remove tripping hazards from floor). Patient demonstrates increased fall risk as noted by score of 23/56 on Berg Balance Scale.  (<36= high risk for falls, close to 100%; 37-45 significant >80%; 46-51 moderate >50%; 52-55 lower >25%). With one task left on Berg Balance Test (Score taken from previous PT note), pt returns to sitting on EOB & has emesis episode. Provided pt with cold washcloth to neck per pt request. Pt returns to sitting up in bed with supervision & hospital bed features. RN notified of pt's emesis occurrence. Pt requesting to rest & left in bed with alarm set & needs at hand.   Therapy Documentation Precautions:  Precautions Precautions: Fall Precaution Comments: ataxia Restrictions Weight Bearing Restrictions: No   General: PT Amount of Missed Time (min): 43 Minutes PT Missed Treatment Reason: Patient ill (Comment)(nausea/vomiting)   Balance: Balance Balance Assessed: Yes Standardized Balance Assessment Standardized Balance Assessment: Berg Balance Test Berg Balance Test Sit to Stand: Able to stand without using hands and stabilize independently Standing Unsupported: Able to stand 2 minutes with supervision Sitting with Back Unsupported but Feet Supported on Floor or Stool: Able to sit safely and securely 2  minutes Stand to Sit: Sits independently, has uncontrolled descent Transfers: Able to transfer with verbal cueing and /or supervision Standing Unsupported with Eyes Closed: Able to stand 10 seconds with supervision Standing Ubsupported with Feet Together: Needs help to attain position and unable to hold for 15 seconds From Standing, Reach Forward with Outstretched Arm: Reaches forward but needs supervision From Standing Position, Pick up Object from Floor: Unable to try/needs assist to keep balance From Standing Position, Turn to Look Behind Over each Shoulder: Needs supervision when turning Turn 360 Degrees: Needs assistance while turning Standing Unsupported, Alternately Place Feet on Step/Stool: Able to complete >2 steps/needs minimal assist(per previous note, not attempted 2/2 nausea) Standing Unsupported, One Foot in Front: Able to take small step independently and hold 30 seconds Standing on One Leg: Tries to lift leg/unable to hold 3 seconds but remains standing independently(pt shows good awareness & reports "I think I can stand on my R foot" but attempts task standing on LLE) Total Score: 23    Therapy/Group: Individual Therapy  Waunita Schooner 11/26/2018, 2:58 PM

## 2018-11-26 NOTE — Progress Notes (Signed)
Occupational Therapy Session Note  Patient Details  Name: Stephanie Love MRN: [id number] Date of Birth: 1935/11/05  Today's Date: 11/26/2018 OT Individual Time: 0845-1000 OT Individual Time Calculation (min): 75 min    Short Term Goals: Week 2:  OT Short Term Goal 1 (Week 2): STG=LTG d/t ELOS  Skilled Therapeutic Interventions/Progress Updates:    Patient seated in recliner - remembers me from last week.  She states that she did not sleep well last night but is happy to participate in therapy session.  Reviewed her report of instability on her feet - she describes her head feeling heavy and occ pain in head and neck.  Completed visual motor activities in seated and standing positions - pursuits with simulated marzden ball:  Unable to sustain focus with swing to the right, jerky motion noted to right, ant/post without difficulty, rotary positions without difficulty - saccades - accurate at slow rate - accommodation - wfl (approx 26/min) - VOR:  Slow and difficulty noted side to side, able to complete sup/inf at slow rate - reaction time with dynavision:  Seated, bottom half for 1 minute - trial 1 = 1.62 sec, trial 2 = 1.5 sec                                       In stance, whole screen for 2 minutes - trail 1 = 1.79 sec, unable to complete second trial due to nausea and dizziness - brock string : able to see x pattern, no signs of suppression within 24 inches, more difficulty noted at further distance placed at midline  (need to continue to monitor/assess) Short distance ambulation and SPT with RW to/from recliner and w/c with CGA.  She returned to recliner at close of session with chair alarm set and call bell in reach.    Therapy Documentation Precautions:  Precautions Precautions: Fall Precaution Comments: ataxia Restrictions Weight Bearing Restrictions: No General: General PT Missed Treatment Reason: Patient ill (Comment)(nausea/vomiting) Vital Signs: Therapy Vitals Temp: 98.5  F (36.9 C) Temp Source: Oral Pulse Rate: 81 Resp: 18 BP: 107/61 Patient Position (if appropriate): Lying Oxygen Therapy SpO2: 98 % O2 Device: Room Air Pain: Pain Assessment Pain Scale: 0-10 Pain Score: 0-No pain   Other Treatments:     Therapy/Group: Individual Therapy  Carlos Levering 11/26/2018, 4:01 PM

## 2018-11-26 NOTE — Progress Notes (Signed)
PHYSICAL MEDICINE & REHABILITATION PROGRESS NOTE  Subjective/Complaints: Patient seen working with therapies this morning.  She states she did not sleep well overnight because she was not in her bed.  She has several questions regarding iron, osteoarthritis, knee injections, etc.  She notes she did receive iron infusion yesterday.  ROS: Denies CP, shortness of breath, diarrhea.  Objective: Vital Signs: Blood pressure (!) 142/74, pulse 80, temperature 97.7 F (36.5 C), temperature source Oral, resp. rate 16, height 5\' 2"  (1.575 m), weight 59.8 kg, SpO2 97 %. No results found. No results for input(s): WBC, HGB, HCT, PLT in the last 72 hours. Recent Labs    11/24/18 0510  NA 137  K 4.1  CL 107  CO2 22  GLUCOSE 87  BUN 12  CREATININE 0.90  CALCIUM 9.6    Physical Exam: BP (!) 142/74 (BP Location: Right Arm)   Pulse 80   Temp 97.7 F (36.5 C) (Oral)   Resp 16   Ht 5\' 2"  (1.575 m)   Wt 59.8 kg   SpO2 97%   BMI 24.11 kg/m  Constitutional: No distress . Vital signs reviewed. HENT: Normocephalic.  Traumatic. Eyes: EOMI.  No discharge. Cardiovascular: No JVD. Respiratory: Normal effort. GI: Non-distended. Musc: No edema or tenderness in extremities. Neurological: Alert and oriented. Motor: Right upper extremity/right lower extremity: 5/5 proximal distal, unchanged Left upper extremity/left lower extremity: 4-4+/5 proximal distal, stable HOH Skin: Skin is warm and dry.  Psychiatric: She has a normal mood and affect. Her behavior is normal.   Assessment/Plan: 1. Functional deficits secondary to bilateral brain infarcts as well as right brain bleeds which require 3+ hours per day of interdisciplinary therapy in a comprehensive inpatient rehab setting.  Physiatrist is providing close team supervision and 24 hour management of active medical problems listed below.  Physiatrist and rehab team continue to assess barriers to discharge/monitor patient progress toward  functional and medical goals  Care Tool:  Bathing    Body parts bathed by patient: Right arm, Left arm, Chest, Abdomen, Front perineal area, Buttocks, Right upper leg, Left upper leg, Right lower leg, Left lower leg, Face         Bathing assist Assist Level: Supervision/Verbal cueing     Upper Body Dressing/Undressing Upper body dressing   What is the patient wearing?: Bra, Pull over shirt    Upper body assist Assist Level: Independent    Lower Body Dressing/Undressing Lower body dressing      What is the patient wearing?: Underwear/pull up, Pants     Lower body assist Assist for lower body dressing: Supervision/Verbal cueing     Toileting Toileting    Toileting assist Assist for toileting: Contact Guard/Touching assist     Transfers Chair/bed transfer  Transfers assist     Chair/bed transfer assist level: Contact Guard/Touching assist     Locomotion Ambulation   Ambulation assist      Assist level: Contact Guard/Touching assist Assistive device: Walker-rolling Max distance: 180'   Walk 10 feet activity   Assist     Assist level: Contact Guard/Touching assist Assistive device: Walker-rolling   Walk 50 feet activity   Assist Walk 50 feet with 2 turns activity did not occur: Safety/medical concerns  Assist level: Contact Guard/Touching assist Assistive device: Walker-rolling    Walk 150 feet activity   Assist Walk 150 feet activity did not occur: Safety/medical concerns  Assist level: Contact Guard/Touching assist Assistive device: Walker-rolling    Walk 10 feet on uneven surface  activity   Assist Walk 10 feet on uneven surfaces activity did not occur: Safety/medical concerns         Wheelchair     Assist Will patient use wheelchair at discharge?: No Type of Wheelchair: Manual    Wheelchair assist level: Supervision/Verbal cueing Max wheelchair distance: 6'    Wheelchair 50 feet with 2 turns  activity    Assist        Assist Level: Supervision/Verbal cueing   Wheelchair 150 feet activity     Assist Wheelchair 150 feet activity did not occur: Safety/medical concerns          Medical Problem List and Plan: 1.  Weakness, balance deficits with posterior lean secondary to bilateral infarcts as well as right brain bleeds.  Continue CIR 2.  Antithrombotics: -DVT/anticoagulation:  Mechanical: Sequential compression devices, below knee Bilateral lower extremities             -antiplatelet therapy: ASA 3. Pain Management: Tylenol prn.  4. Mood: LCSW to follow for evaluation and support.              -antipsychotic agents: N/A 5. Neuropsych: This patient is capable of making decisions on her own behalf. 6. Skin/Wound Care: Routine pressure relief measures.  7. Fluids/Electrolytes/Nutrition: Routine I/Os.    BMP within acceptable range on 5/27 8. HTN: Monitor BP bid. On Cozaar PTA.  Cont Metoprolol  Labile on 6/5  Monitor with increased mobility 9.  Dyslipidemia: No meds due to NASH  10. COPD: Continue Daliresp and Incruse daily.   11. Hypothyroid: On supplement.  12.Transient urinary retention:     PVRs showing borderline retention, unchanged 13.H/o anemia:   Hemoglobin 7.4 on 6/1, labs ordered for Monday  Given iron infusion on 6/4  Iron with Vit C ordered on 6/2, decreased to daily on 6/4 due to nausea  Continue to monitor 14.  Prediabetes  Continue to monitor  Control on 6/5, CBGs DC'd 15.  Hypoalbuminemia  Supplement initiated on 5/28 16.  Constipation  Bowel regimen creased on 6/2  LOS: 9 days A FACE TO FACE EVALUATION WAS PERFORMED  Ankit Lorie Phenix 11/26/2018, 9:53 AM

## 2018-11-27 ENCOUNTER — Inpatient Hospital Stay (HOSPITAL_COMMUNITY): Payer: Medicare Other

## 2018-11-27 LAB — GLUCOSE, CAPILLARY: Glucose-Capillary: 75 mg/dL (ref 70–99)

## 2018-11-27 NOTE — Progress Notes (Signed)
Patient refused vit c and iron med claims it makes her nauseous. Dr. Asa Lente made aware.

## 2018-11-27 NOTE — Progress Notes (Signed)
Stephanie Love is a 83 y.o. female 1936-01-21 [id number]  Subjective: Reports nausea because of iron tablets.  Refusing same noting she already had IV infusion earlier and unclear why additional oral supplement needed.  No new complaints or concerns  Objective: Vital signs in last 24 hours: Temp:  [97.5 F (36.4 C)-98.1 F (36.7 C)] 98.1 F (36.7 C) (06/06 1306) Pulse Rate:  [75-88] 85 (06/06 1306) Resp:  [14-16] 16 (06/06 1306) BP: (94-114)/(57-66) 112/62 (06/06 1306) SpO2:  [94 %-98 %] 96 % (06/06 1306) Weight change:  Last BM Date: 11/25/18  Intake/Output from previous day: 06/05 0701 - 06/06 0700 In: 360 [P.O.:360] Out: -   Physical Exam General: No apparent distress    Lungs: Normal effort. Lungs clear to auscultation, no crackles or wheezes. Cardiovascular: Regular rate and rhythm, no edema Neurological: No new neurological deficits   Lab Results: BMET    Component Value Date/Time   NA 137 11/24/2018 0510   K 4.1 11/24/2018 0510   CL 107 11/24/2018 0510   CO2 22 11/24/2018 0510   GLUCOSE 87 11/24/2018 0510   BUN 12 11/24/2018 0510   CREATININE 0.90 11/24/2018 0510   CALCIUM 9.6 11/24/2018 0510   GFRNONAA 60 (L) 11/24/2018 0510   GFRAA >60 11/24/2018 0510   CBC    Component Value Date/Time   WBC 5.5 11/22/2018 1104   RBC 3.97 11/25/2018 1510   RBC 3.96 11/22/2018 1104   HGB 7.4 (L) 11/22/2018 1104   HCT 26.9 (L) 11/22/2018 1104   PLT 272 11/22/2018 1104   MCV 67.9 (L) 11/22/2018 1104   MCH 18.7 (L) 11/22/2018 1104   MCHC 27.5 (L) 11/22/2018 1104   RDW 17.1 (H) 11/22/2018 1104   LYMPHSABS 1.3 11/17/2018 1438   MONOABS 0.7 11/17/2018 1438   EOSABS 0.1 11/17/2018 1438   BASOSABS 0.0 11/17/2018 1438   CBG's (last 3):   Recent Labs    11/26/18 1638 11/26/18 2121 11/27/18 0623  GLUCAP 120* 109* 75   LFT's Lab Results  Component Value Date   ALT 19 11/17/2018   AST 24 11/17/2018   ALKPHOS 75 11/17/2018   BILITOT 0.5 11/17/2018     Studies/Results: No results found.  Medications:  I have reviewed the patient's current medications. Scheduled Medications: . aspirin EC  325 mg Oral Daily  . enoxaparin (LOVENOX) injection  40 mg Subcutaneous Q24H  . feeding supplement (PRO-STAT SUGAR FREE 64)  30 mL Oral BID  . ferrous sulfate  325 mg Oral Q lunch  . levothyroxine  112 mcg Oral QAC breakfast  . metoprolol succinate  25 mg Oral Daily  . Muscle Rub   Topical TID WC & HS  . nystatin   Topical TID  . pantoprazole  40 mg Oral Daily  . polyethylene glycol  17 g Oral BID  . roflumilast  500 mcg Oral Daily  . senna-docusate  1 tablet Oral QHS  . umeclidinium bromide  1 puff Inhalation Daily  . vitamin C  500 mg Oral BID   PRN Medications: albuterol, alum & mag hydroxide-simeth, bisacodyl, diphenhydrAMINE, fluticasone, guaiFENesin-dextromethorphan, prochlorperazine **OR** prochlorperazine **OR** prochlorperazine, sodium phosphate, traZODone  Assessment/Plan: Active Problems:   Embolic stroke (HCC)   Hypoalbuminemia due to protein-calorie malnutrition (HCC)   Prediabetes   Benign essential HTN   Iron deficiency anemia   Slow transit constipation   Arterial hypotension   Labile blood pressure   1.  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  Functional deficits related to same  requiring interdisciplinary therapy with intensive inpatient rehab as ongoing to improve weakness and balance deficits.  Continue medical and supportive care as ongoing. 2.  Hypertension.  Blood pressures reviewed.  Continue current medications with resumption of home treatment as indicated. 3.  COPD.  Continue ongoing medications.  Symptoms labile and controlled at present. 4.  Iron deficiency anemia.  Intolerant of oral iron because of nausea.  Status post intravenous infusion of iron earlier this week.  Follow-up CBC order pending for Monday.  Okay to hold oral iron pending this follow-up given nausea side  effect.  Length of stay, days: 10  [name], MD 11/27/2018, 2:06 PM

## 2018-11-27 NOTE — Progress Notes (Signed)
Physical Therapy Session Note  Patient Details  Name: Stephanie Love MRN: [id number] Date of Birth: 1936/05/11  Today's Date: 11/27/2018 PT Individual Time: 8101-7510 PT Individual Time Calculation (min): 70 min   Short Term Goals: Week 2:  PT Short Term Goal 1 (Week 2): STG=LTG due to ELOS  Skilled Therapeutic Interventions/Progress Updates:     Patient in bed upon PT arrival. Patient alert and agreeable to PT session. Patient reported that her lunch tray had just arrived, but was agreeable to sitting EOB to work on sitting balance   Therapeutic Activity: Bed Mobility: Patient performed supine to/from sit with supervision with the bed flat and use of the bed rail. Provided cues for starting to not use the bed rail to simulate home bed mobility. She reported mild dizziness in sitting that resolved sitting EOB. Patient sat EOB to eat lunch x15 minutes with supervision for sitting balance for safety. Reported nausea while eating and RN provided medication during session.  Transfers: Patient performed sit to/from stand x3 with CGA for safety. Provided verbal cues for reaching back before sitting and pushing up from a stable surface to stand. Patient stood at the mirror in the bathroom with supervision and brushed her hair and washed her face with alternating single and no UE support.   Gait Training:  Patient ambulated 100 feet, 80 feet, 20 feet, and 15 feet using RW with CGA for safety due to intermittent dizziness with head turns. Ambulated with decreased gait speed, decreased step length, increased hip and knee flexion in stance,and intermittent downward head gaze. Provided verbal cues for looking ahead and looking side to side as tolerated with slow head movements for habituation to head movements with mobility for decreased dizziness.  Therapeutic Exercise: Patient performed the following exercises with verbal and tactile cues for proper technique. -NuStep x 6 min, workload 5, 179  steps  Patient in bed at end of session with breaks locked, bed alarm set, and all needs within reach. Patient reported feeling very fatigued today during session and that she had slept more than usual this morning. PT educated on what a stroke is, types of strokes, recovery, and changes of energy expenditure following a stroke and some energy conservation techniques at end of session.    Therapy Documentation Precautions:  Precautions Precautions: Fall Precaution Comments: ataxia Restrictions Weight Bearing Restrictions: No Vital Signs: Therapy Vitals Pulse Rate: 75 BP: 94/66 Patient Position (if appropriate): Sitting Oxygen Therapy SpO2: 95 % O2 Device: Room Air Pain: Patient denied pain throughout session.   Therapy/Group: Individual Therapy  Caedan Sumler L Greggory Safranek PT, DPT  11/27/2018, 3:52 PM

## 2018-11-28 ENCOUNTER — Inpatient Hospital Stay (HOSPITAL_COMMUNITY): Payer: Medicare Other

## 2018-11-28 NOTE — Progress Notes (Signed)
Stephanie Love is a 83 y.o. female 11-01-1935 [id number]  Subjective: Reports no pain or new concern.  Objective: Vital signs in last 24 hours: Temp:  [97.6 F (36.4 C)-98.3 F (36.8 C)] 97.6 F (36.4 C) (06/07 0340) Pulse Rate:  [85-94] 94 (06/07 0340) Resp:  [16] 16 (06/07 0340) BP: (103-119)/(61-63) 119/63 (06/07 0340) SpO2:  [96 %-99 %] 99 % (06/07 0805) Weight change:  Last BM Date: 11/25/18  Intake/Output from previous day: 06/06 0701 - 06/07 0700 In: 420 [P.O.:420] Out: -   Physical Exam General: No apparent distress    Lungs: Normal effort. Lungs clear to auscultation, no crackles or wheezes. Cardiovascular: Regular rate and rhythm, no edema Neurological: No new neurological deficits   Lab Results: BMET    Component Value Date/Time   NA 137 11/24/2018 0510   K 4.1 11/24/2018 0510   CL 107 11/24/2018 0510   CO2 22 11/24/2018 0510   GLUCOSE 87 11/24/2018 0510   BUN 12 11/24/2018 0510   CREATININE 0.90 11/24/2018 0510   CALCIUM 9.6 11/24/2018 0510   GFRNONAA 60 (L) 11/24/2018 0510   GFRAA >60 11/24/2018 0510   CBC    Component Value Date/Time   WBC 5.5 11/22/2018 1104   RBC 3.97 11/25/2018 1510   RBC 3.96 11/22/2018 1104   HGB 7.4 (L) 11/22/2018 1104   HCT 26.9 (L) 11/22/2018 1104   PLT 272 11/22/2018 1104   MCV 67.9 (L) 11/22/2018 1104   MCH 18.7 (L) 11/22/2018 1104   MCHC 27.5 (L) 11/22/2018 1104   RDW 17.1 (H) 11/22/2018 1104   LYMPHSABS 1.3 11/17/2018 1438   MONOABS 0.7 11/17/2018 1438   EOSABS 0.1 11/17/2018 1438   BASOSABS 0.0 11/17/2018 1438   CBG's (last 3):   Recent Labs    11/26/18 1638 11/26/18 2121 11/27/18 0623  GLUCAP 120* 109* 75   LFT's Lab Results  Component Value Date   ALT 19 11/17/2018   AST 24 11/17/2018   ALKPHOS 75 11/17/2018   BILITOT 0.5 11/17/2018    Studies/Results: No results found.  Medications:  I have reviewed the patient's current medications. Scheduled Medications: . aspirin EC  325 mg Oral  Daily  . enoxaparin (LOVENOX) injection  40 mg Subcutaneous Q24H  . feeding supplement (PRO-STAT SUGAR FREE 64)  30 mL Oral BID  . ferrous sulfate  325 mg Oral Q lunch  . levothyroxine  112 mcg Oral QAC breakfast  . metoprolol succinate  25 mg Oral Daily  . Muscle Rub   Topical TID WC & HS  . nystatin   Topical TID  . pantoprazole  40 mg Oral Daily  . polyethylene glycol  17 g Oral BID  . roflumilast  500 mcg Oral Daily  . senna-docusate  1 tablet Oral QHS  . umeclidinium bromide  1 puff Inhalation Daily  . vitamin C  500 mg Oral BID   PRN Medications: albuterol, alum & mag hydroxide-simeth, bisacodyl, diphenhydrAMINE, fluticasone, guaiFENesin-dextromethorphan, prochlorperazine **OR** prochlorperazine **OR** prochlorperazine, sodium phosphate, traZODone  Assessment/Plan: Active Problems:   Embolic stroke (HCC)   Hypoalbuminemia due to protein-calorie malnutrition (HCC)   Prediabetes   Benign essential HTN   Iron deficiency anemia   Slow transit constipation   Arterial hypotension   Labile blood pressure   1.  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  Functional deficits related to same requiring interdisciplinary therapy with intensive inpatient rehab as ongoing to improve weakness and balance deficits.  Continue medical and supportive care as ongoing.  2.  Hypertension.  Blood pressures reviewed.  Continue current medications with resumption of home treatment as indicated. 3.  COPD.  Continue ongoing medications.  Symptoms labile and controlled at present. 4.  Iron deficiency anemia.  Intolerant of oral iron because of nausea.  Status post intravenous infusion of iron earlier this week.  Follow-up CBC.    Length of stay, days: 11   A. Asa Lente, MD 11/28/2018, 9:45 AM

## 2018-11-28 NOTE — Progress Notes (Signed)
Occupational Therapy Session Note  Patient Details  Name: Stephanie Love MRN: [id number] Date of Birth: 1936-01-04  Today's Date: 11/28/2018 OT Individual Time:  -       Short Term Goals: Week 1:  OT Short Term Goal 1 (Week 1): Pt will transfer on and off toilet with min A. OT Short Term Goal 1 - Progress (Week 1): Met OT Short Term Goal 2 (Week 1): Pt will transfer on and off shower bench with min A. OT Short Term Goal 2 - Progress (Week 1): Met OT Short Term Goal 3 (Week 1): Pt will be able to stand with min A or less during LB self care. OT Short Term Goal 3 - Progress (Week 1): Met  Skilled Therapeutic Interventions/Progress Updates:    1;1. Pt received in bed with no c/o pain. Pt agreeable to bathing and dressing at sink with use of rolator for energy conservation. Pt completes ambulation with rolator and MAX VC for safety awareness when approaching sink, turning anround and locking brakes to sit on at sink. tp completes bathing and dressing at sit to stand with Supervision (independent for UB dressing). Pt completes grooming standing at sink with superision. Pt completes donning shoes seated EOB with set up. Pt amubaltes to nustep with supervision and VC for not allowing to walker too far in front of pt. While completing 10 min Nustep (workload 5 for 5 min and 6 for 5 min) OT adjusts rolator lower to fit pt. Exited session with pt returned to room seated in bed , call light in reach, exit alarm on and all needs met  Therapy Documentation Precautions:  Precautions Precautions: Fall Precaution Comments: ataxia Restrictions Weight Bearing Restrictions: No General:   Vital Signs: Oxygen Therapy SpO2: 99 % O2 Device: Room Air Pain:   ADL: ADL ADL Comments: Pt able to bathe and dress herself but needs mod A with transfers and standing balance. Vision   Perception    Praxis   Exercises:   Other Treatments:     Therapy/Group: Individual Therapy  Tonny Branch 11/28/2018, 10:40 AM

## 2018-11-29 ENCOUNTER — Inpatient Hospital Stay (HOSPITAL_COMMUNITY): Payer: Medicare Other | Admitting: Physical Therapy

## 2018-11-29 ENCOUNTER — Inpatient Hospital Stay (HOSPITAL_COMMUNITY): Payer: Medicare Other

## 2018-11-29 ENCOUNTER — Inpatient Hospital Stay (HOSPITAL_COMMUNITY): Payer: Medicare Other | Admitting: Occupational Therapy

## 2018-11-29 DIAGNOSIS — I959 Hypotension, unspecified: Secondary | ICD-10-CM

## 2018-11-29 LAB — CBC WITH DIFFERENTIAL/PLATELET
Abs Immature Granulocytes: 0.05 10*3/uL (ref 0.00–0.07)
Basophils Absolute: 0.1 10*3/uL (ref 0.0–0.1)
Basophils Relative: 1 %
Eosinophils Absolute: 0.2 10*3/uL (ref 0.0–0.5)
Eosinophils Relative: 3 %
HCT: 30.2 % — ABNORMAL LOW (ref 36.0–46.0)
Hemoglobin: 8.4 g/dL — ABNORMAL LOW (ref 12.0–15.0)
Immature Granulocytes: 1 %
Lymphocytes Relative: 23 %
Lymphs Abs: 1.7 10*3/uL (ref 0.7–4.0)
MCH: 20.6 pg — ABNORMAL LOW (ref 26.0–34.0)
MCHC: 27.8 g/dL — ABNORMAL LOW (ref 30.0–36.0)
MCV: 74.2 fL — ABNORMAL LOW (ref 80.0–100.0)
Monocytes Absolute: 0.9 10*3/uL (ref 0.1–1.0)
Monocytes Relative: 12 %
Neutro Abs: 4.4 10*3/uL (ref 1.7–7.7)
Neutrophils Relative %: 60 %
Platelets: 442 10*3/uL — ABNORMAL HIGH (ref 150–400)
RBC: 4.07 MIL/uL (ref 3.87–5.11)
RDW: 25.3 % — ABNORMAL HIGH (ref 11.5–15.5)
WBC: 7.3 10*3/uL (ref 4.0–10.5)
nRBC: 0 % (ref 0.0–0.2)

## 2018-11-29 LAB — GLUCOSE, CAPILLARY: Glucose-Capillary: 85 mg/dL (ref 70–99)

## 2018-11-29 MED ORDER — SENNOSIDES-DOCUSATE SODIUM 8.6-50 MG PO TABS
2.0000 | ORAL_TABLET | Freq: Every day | ORAL | Status: DC
  Administered 2018-11-29: 2 via ORAL
  Filled 2018-11-29: qty 2

## 2018-11-29 NOTE — Discharge Summary (Addendum)
Physician Discharge Summary  Patient ID: Stephanie Love MRN: [id number] DOB/AGE: [birth year] 83 y.o.  Admit date: 11/17/2018 Discharge date: 11/30/2018  Discharge Diagnoses:  Principal Problem:   Embolic stroke [facility]) Active Problems:   Ataxia   Hypoalbuminemia due to protein-calorie malnutrition (HCC)   Prediabetes   Benign essential HTN   Iron deficiency anemia   Slow transit constipation   Discharged Condition: stable   Significant Diagnostic Studies: Mr Brain 81 Contrast  Result Date: 11/14/2018 CLINICAL DATA:  Stroke follow-up. Dizziness. Dysconjugate gaze, dysarthria, and left-sided ataxia. Received IV tPA. EXAM: MRI HEAD WITHOUT CONTRAST TECHNIQUE: Multiplanar, multiecho pulse sequences of the brain and surrounding structures were obtained without intravenous contrast. COMPARISON:  None. FINDINGS: Brain: There is a 3 cm acute infarct superiorly in the left cerebellum. Punctate acute infarcts are present involving bilateral parietal cortex and right frontal white matter. There is small volume subarachnoid hemorrhage in the right occipital and right parietal lobes, and there is also a small subdural hematoma over the posterior right cerebral convexity measuring up to 3 mm in thickness without mass effect. The ventricles are normal in size. Minimal chronic small vessel ischemic changes are present in the cerebral white matter, not greater than expected for age. Vascular: Major intracranial vascular flow voids are preserved. Skull and upper cervical spine: No suspicious marrow lesion. Sinuses/Orbits: Clear paranasal sinuses. Moderate right mastoid effusion. Other: None. IMPRESSION: 1. 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 2. Punctate acute bilateral parietal and right frontal infarcts. 3. Small volume right parieto-occipital subarachnoid hemorrhage and small right-sided subdural hematoma without mass effect. Critical Value/emergent results were called by telephone at the  time of interpretation on 11/14/2018 at 10:25 pm to Dr. Samara Snide , who verbally acknowledged these results. Electronically Signed   By: Logan Bores M.D.   On: 11/14/2018 22:25   Vas [place] Lower Extremity Venous (dvt)  Result Date: 11/16/2018  Lower Venous Study Indications: Embolic stroke.  Performing Technologist: Oliver Hum RVT  Examination Guidelines: A complete evaluation includes B-mode imaging, spectral Doppler, color Doppler, and power Doppler as needed of all accessible portions of each vessel. Bilateral testing is considered an integral part of a complete examination. Limited examinations for reoccurring indications may be performed as noted.  +---------+---------------+---------+-----------+----------+-------+ RIGHT    CompressibilityPhasicitySpontaneityPropertiesSummary +---------+---------------+---------+-----------+----------+-------+ CFV      Full           Yes      Yes                          +---------+---------------+---------+-----------+----------+-------+ SFJ      Full                                                 +---------+---------------+---------+-----------+----------+-------+ FV Prox  Full                                                 +---------+---------------+---------+-----------+----------+-------+ FV Mid   Full                                                 +---------+---------------+---------+-----------+----------+-------+  FV DistalFull                                                 +---------+---------------+---------+-----------+----------+-------+ PFV      Full                                                 +---------+---------------+---------+-----------+----------+-------+ POP      Full           Yes      Yes                          +---------+---------------+---------+-----------+----------+-------+ PTV      Full                                                  +---------+---------------+---------+-----------+----------+-------+ PERO     Full                                                 +---------+---------------+---------+-----------+----------+-------+   +---------+---------------+---------+-----------+----------+-------+ LEFT     CompressibilityPhasicitySpontaneityPropertiesSummary +---------+---------------+---------+-----------+----------+-------+ CFV      Full           Yes      Yes                          +---------+---------------+---------+-----------+----------+-------+ SFJ      Full                                                 +---------+---------------+---------+-----------+----------+-------+ FV Prox  Full                                                 +---------+---------------+---------+-----------+----------+-------+ FV Mid   Full                                                 +---------+---------------+---------+-----------+----------+-------+ FV DistalFull                                                 +---------+---------------+---------+-----------+----------+-------+ PFV      Full                                                 +---------+---------------+---------+-----------+----------+-------+ POP  Full           Yes      Yes                          +---------+---------------+---------+-----------+----------+-------+ PTV      Full                                                 +---------+---------------+---------+-----------+----------+-------+ PERO     Full                                                 +---------+---------------+---------+-----------+----------+-------+     Summary: Right: There is no evidence of deep vein thrombosis in the lower extremity. No cystic structure found in the popliteal fossa. Left: There is no evidence of deep vein thrombosis in the lower extremity. No cystic structure found in the popliteal fossa.  *See table(s) above for  measurements and observations. Electronically signed by Deitra Mayo MD on 11/16/2018 at 6:30:14 AM.    Final     Labs:  Basic Metabolic Panel: BMP Latest Ref Rng & Units 11/24/2018 11/17/2018  Glucose 70 - 99 mg/dL 87 162(H)  BUN 8 - 23 mg/dL 12 17  Creatinine 0.44 - 1.00 mg/dL 0.90 0.88  Sodium 135 - 145 mmol/L 137 135  Potassium 3.5 - 5.1 mmol/L 4.1 3.5  Chloride 98 - 111 mmol/L 107 105  CO2 22 - 32 mmol/L 22 22  Calcium 8.9 - 10.3 mg/dL 9.6 9.2    CBC: CBC Latest Ref Rng & Units 11/29/2018 11/22/2018 11/19/2018  WBC 4.0 - 10.5 K/uL 7.3 5.5 5.6  Hemoglobin 12.0 - 15.0 g/dL 8.4(L) 7.4(L) 7.2(L)  Hematocrit 36.0 - 46.0 % 30.2(L) 26.9(L) 25.4(L)  Platelets 150 - 400 K/uL 442(H) 272 223    CBG: Recent Labs  Lab 11/26/18 1158 11/26/18 1638 11/26/18 2121 11/27/18 0623 11/29/18 2146  GLUCAP 108* 120* 109* 75 85    Brief HPI:   Stephanie Love is an 83 year old female with history of COPD, NASH, anemia who was evaluated at [facility] for speech difficulties, visual changes and ataxia.  CT head negative and CTA negative for LVO.  She received TPA and was transferred to [facility] for further work-up 0.0 11/14/2018.  2D echo done revealing EF of 60 to 65%.  MRI of brain revealed acute left superior cerebellar infarct with acute bilateral parietal and right frontal infarcts with small volume right parieto-occipital hemorrhage and small right SDH.  Stroke felt to be cardioembolic and loop recorder was placed by Dr. Rayann Heman.  Aspirin was added for secondary stroke prevention and no plans for TEE.  Therapy evaluations completed revealing deficits in balance, posterior lean as well as tendency to keep eyes closed with mobility.  CIR was recommended due to functional decline in ADLs and mobility   Physical Exam  Constitutional: She is oriented to person, place, and time. She appears well-developed and well-nourished. No distress.  HENT:  Head: Normocephalic and atraumatic.   Mouth/Throat: Oropharynx is clear and moist.  Eyes: Conjunctivae and EOM are normal.  Neck: Normal range of motion. Neck supple.  Cardiovascular: Normal rate and regular rhythm.  Pulmonary/Chest: Effort normal and breath sounds  normal. No stridor. No respiratory distress. She has no wheezes.  Abdominal: Soft. Bowel sounds are normal. She exhibits no distension. There is no abdominal tenderness.  Musculoskeletal: Normal range of motion.        General: No tenderness or edema.  Neurological: She is alert and oriented to person, place, and time.  Speech clear. Able to follow commands without difficulty. Motor- RUE/RLE 5/5 and LUE/LLE 4+/5  Skin: Skin is warm and dry. She is not diaphoretic. No erythema.  Psychiatric: She has a normal mood and affect. Her behavior is normal. Thought content normal.  Nursing note and vitals reviewed.   Hospital Course: Stephanie Love was admitted to rehab 11/17/2018 for inpatient therapies to consist of PT, ST and OT at least three hours five days a week. Past admission physiatrist, therapy team and rehab RN have worked together to provide customized collaborative inpatient rehab.  She was maintained on aspirin for secondary stroke prevention.  Blood pressures were monitored on twice daily and were occasionally bouts of hypotension noted.  Follow-up check of BMET revealed electrolytes and renal status to be within normal limits.  Due to reports of urinary retention acute PVRs were done showing borderline volumes therefore this was discontinued.  She reported issues with fatigue thas has been ongoing and serial CBC reveals anemia. She reported nausea with iron supplements and has refused oral supplements.  She received one dose of Feraheme 510 mg IV on 06/02 and is to follow up with primary MD for repeat dose.    Sportscreme was added to help with chronic bilateral shoulder pain. Nutritional supplements were added to help with low calorie malnutrition however she has  been refusing this.  She is also refused vitamin C supplements and bowel program to help manage  constipation.  She continues to be limited by occasional dizziness and ataxia. She has progressed to supervision and occasionally requires contact-guard assist due to vestibular issues.  She will continue to receive further follow-up home health PT and OT by [facility] at home after discharge   Rehab course: During patient's stay in rehab weekly team conferences were held to monitor patient's progress, set goals and discuss barriers to discharge. At admission, patient required mod assist with basic self-care task and with mobility.  She  has had improvement in activity tolerance, balance, postural control as well as ability to compensate for deficits.  She is able to complete ADL tasks with supervision. She requires supervision for transfers and is able to ambulate 150 feet with supervision and verbal cues with Rollator.  She requires min guard assist to climb 10 stairs.  Family education was completed with daughter regarding all aspects of care and safety.    Disposition: Home  Diet: Heart healthy/Limit carbs.   Special Instructions: 1.  Needs 24-hour supervision 2.  Repeat CBC in 1-2 weeks to monitor for stability.   Discharge Instructions    Ambulatory referral to Physical Medicine Rehab   Complete by:  As directed    1-2 weeks transitional care appt     Allergies as of 11/30/2018      Reactions   Penicillins Anaphylaxis   Did it involve swelling of the face/tongue/throat, SOB, or low BP? yes Did it involve sudden or severe rash/hives, skin peeling, or any reaction on the inside of your mouth or nose? no Did you need to seek medical attention at a hospital or doctor's office? yes When did it last happen?pt was 22 If all above answers are "NO", may  proceed with cephalosporin use.   Codeine    hyper      Medication List    STOP taking these medications   acetaminophen 325 MG  tablet Commonly known as:  TYLENOL   escitalopram 10 MG tablet Commonly known as:  LEXAPRO   losartan 25 MG tablet Commonly known as:  COZAAR   senna 8.6 MG Tabs tablet Commonly known as:  SENOKOT     TAKE these medications   albuterol (2.5 MG/3ML) 0.083% nebulizer solution Commonly known as:  PROVENTIL Inhale 3 mLs (2.5 mg total) into the lungs every 6 (six) hours as needed for wheezing or shortness of breath.   arformoterol 15 MCG/2ML Nebu Commonly known as:  BROVANA Take 15 mcg by nebulization every 12 (twelve) hours.   ascorbic acid 500 MG tablet Commonly known as:  VITAMIN C Take 1 tablet (500 mg total) by mouth 2 (two) times daily.   aspirin 325 MG EC tablet Take 1 tablet (325 mg total) by mouth daily.   cholecalciferol 25 MCG (1000 UT) tablet Commonly known as:  VITAMIN D3 Take 1,000 Units by mouth daily.   Daliresp 500 MCG Tabs tablet Generic drug:  roflumilast Take 500 mcg by mouth daily.   ferrous sulfate 325 (65 FE) MG tablet Take 1 tablet (325 mg total) by mouth daily with lunch.   fluticasone 50 MCG/ACT nasal spray Commonly known as:  FLONASE Place 1-2 sprays into both nostrils daily as needed for allergies or rhinitis.   lansoprazole 15 MG capsule Commonly known as:  PREVACID Take 15 mg by mouth daily at 12 noon.   levothyroxine 112 MCG tablet Commonly known as:  SYNTHROID Take 112 mcg by mouth daily before breakfast.   metoprolol succinate 25 MG 24 hr tablet Commonly known as:  TOPROL-XL Take 1 tablet (25 mg total) by mouth daily.   Muscle Rub 10-15 % Crea Apply 1 application topically 4 (four) times daily -  with meals and at bedtime. To bilateral shoulders   polyethylene glycol 17 g packet Commonly known as:  MIRALAX / GLYCOLAX Take 17 g by mouth 2 (two) times daily. Notes to patient:  For constipation--can adjust as needed   senna-docusate 8.6-50 MG tablet Commonly known as:  Senokot-S Take 1 tablet by mouth at bedtime as needed for  mild constipation.   Tiotropium Bromide Monohydrate 1.25 MCG/ACT Aers Commonly known as:  Spiriva Respimat Inhale 2 puffs into the lungs daily. You can use INCRUSE  Daily (was used at the hospital)--once that is used up you can return to using Spiriva. DO not use both together What changed:  additional instructions      Follow-up Information    Jamse Arn, MD Follow up.   Specialty:  Physical Medicine and Rehabilitation Why:  Office will call you with follow up appointment Contact information: [address] [place] [address] [phone]        Marco Collie, MD Follow up on 12/10/2018.   Specialty:  Family Medicine Why:  APPOINTMENT @ 11:40 AM Contact information: [place] [address] [phone]        [facility]. Call.   Why:  for follow up appointment Contact information: [address]     [address] [place] [address] [phone]       Thompson Grayer, MD. Call.   Specialty:  Cardiology Why:  for follow up after loop recorder placement Contact information: [place]  [place] [address] [phone]           Signed: Bary Leriche 11/30/2018, 4:19 PM Patient seen and examined by me on day of discharge. Delice Lesch, MD, ABPMR

## 2018-11-29 NOTE — Progress Notes (Signed)
Physical Therapy Session Note  Patient Details  Name: Stephanie Love MRN: [id number] Date of Birth: 1935-07-04  Today's Date: 11/29/2018 PT Individual Time: 0800-0900 PT Individual Time Calculation (min): 60 min   Short Term Goals: Week 2:  PT Short Term Goal 1 (Week 2): STG=LTG due to ELOS  Skilled Therapeutic Interventions/Progress Updates:  Pt received in bed finishing her breakfast tray. Agreeable to therapy at this time.  With bed flat pt needed supervision with rail to go from supine to sitting at edge of bed. Increased time and effort. While seated edge of bed with distant supervision pt was able to self don pant on bil LE's and take all meds from nurse with no loss of balance. Supervision to stand to rollator to pull up pants remainder of way then pt needing to sit back down due to muscle fatigue and head "pressure". After ~1 minute pt stood again to rollator with supervision, increased time/effort and gait from edge of bed to NuStep in hallway. Supervision for transfer to/from NuStep. On nustep at level 6 with UE/LE's for 10 minutes with goal >/=40 steps per minute for strengthening and activity tolerance. Min guard assist to stand from NuStep to rollator and to ambulate 75 feet before needing to rest on rollator seat due to fatigue/heavy head feeling. Min guard assist to lock, turn and sit safely on rollator seat with cues on technique. Same cues needed to stand from rollator and resume gait for 80 feet back to room. Cues with rollator each time on posture and rollator position with gait. In room supervision to sit into recliner with cues for use of UE's to control descent into chair. Pt with c/o's of dizziness and heavy head feeling. Inquired when she last did the eye exercises given to her a few visits back, pt stating she has not done them since then. Reviewed seated gaze stabilization with head movements left<>right and up<>down. performed each for 30 sec's x 3 reps with rest break  between each rep. Pt reported less symptoms as reps progressed.   Pt left in chair with alarm on and all needs in reach.   Therapy Documentation Precautions:  Precautions Precautions: Fall Precaution Comments: ataxia, dizziness Restrictions Weight Bearing Restrictions: No    Therapy/Group: Individual Therapy  Willow Ora, PTA 11/29/2018, 4:18 PM

## 2018-11-29 NOTE — Progress Notes (Signed)
Physical Therapy Session Note  Patient Details  Name: Stephanie Love MRN: [id number] Date of Birth: [birth year]  Today's Date: 11/29/2018 PT Individual Time:Session1: 4098-1191; Arthor Captain: [id number] PT Individual Time Calculation (min): 45 min & 45 min   Short Term Goals: Week 2:  PT Short Term Goal 1 (Week 2): STG=LTG due to ELOS  Skilled Therapeutic Interventions/Progress Updates:    Session1: Patient in supine, reports fatigued from earlier therapy sessions.  Stated dizziness worse today as well. Supine to sit S with rail and time.  Sit to stand S with rollator demonstrating safe technique with locking brakes, etc.  Patient ambulated with S x 160' with w/c following to allow transport to practice stairs.  Patient negotiated 10 steps with two hands to L rail with CGA for safety.  Patient assisted in w/c back to room and stand step to bed with rollator and S and sit to supine with S.  Left with all needs in reach and bed alarm activated.   Ackerman:  Patient in supine reports feeling better.  Supine to sit S and ambulated with rollator x 160' with S.  Assisted in w/c to 4MW gym to practice car transfer to SUV height and pt able to perform with S and cues for technique.  Negotiated ramp with rollator and CGA.  Assisted in w/c to room and obtained HEP for pt and she performed standing minisquats at sink with UE support, side stepping at counter with UE support, backward walking with 1 UE support and S, tandem stand with 1 UE support x 30 sec and sit<>stand with bilat UE support x 10.  Patient returned to supine and left with needs in reach and bed alarm active.   Therapy Documentation Precautions:  Precautions Precautions: Fall Precaution Comments: ataxia, dizziness Restrictions Weight Bearing Restrictions: No Pain: Pain Assessment Faces Pain Scale: No hurt    Therapy/Group: Individual Therapy  Reginia Naas  Magda Kiel, PT 11/29/2018, 4:52 PM

## 2018-11-29 NOTE — Progress Notes (Signed)
Physical Therapy Discharge Summary  Patient Details  Name: Stephanie Love MRN: [id number] Date of Birth: Oct 26, 1935  Today's Date: 11/30/2018 PT Individual Time: 1003-1057 PT Individual Time Calculation (min): 54 min   Patient seen with daughter present for caregiver education.  Patient in gym negotiated 5 steps with R railing (daughter reports her rail is on R hand side at home) with CGA and then daughter able to return demonstrate appropriate technique for CGA on 5 steps with R rail. Patient ambulated with rollator x 150' with S and daughter provided appropriate technique for S level assistance.  Performed car transfer (simulated) to SUV height with makeshift running board with S to [place] increased time with rollator.  Patient performed her HEP at counter with daughter providing appropriate guarding for sit<>stand, standing knee bends, side stepping, backward walking and tandem stand.  Daughter educated on pt able to recover from floor during session yesterday and discussed when pt eventually goes home getting technology for home monitoring etc.  Patient and daughter verbalized understanding of all.  Patient in supine end of session with RN aware pt ready for d/c home.     Patient has met 8 of 11 long term goals due to improved balance, increased strength, ability to compensate for deficits and improved coordination.  Patient to discharge at an ambulatory level [place].   Patient's care partner is independent to provide the necessary physical assistance at discharge.  Reasons goals not met: Patient with intermittent dizziness contributing to decreased balance at times with dynamic standing balance and needing CGA on stairs for safety as well as min A for car transfer to SUV height vehicle.  Recommendation:  Patient will benefit from ongoing skilled PT services in home health setting to continue to advance safe functional mobility, address ongoing impairments in balance, dizziness, endurance and  safety, and minimize fall risk.  Equipment: rollator  Reasons for discharge: treatment goals met and discharge from hospital  Patient/family agrees with progress made and goals achieved: Yes  PT Discharge Precautions/Restrictions Precautions Precautions: Fall Precaution Comments: ataxia, dizziness  Pain Pain Assessment Faces Pain Scale: No hurt Vision/Perception  Perception Perception: Within Functional Limits Praxis Praxis: Intact  Cognition Overall Cognitive Status: Within Functional Limits for tasks assessed Arousal/Alertness: Awake/alert Orientation Level: Oriented X4 Attention: Selective Safety/Judgment: Appears intact Sensation Sensation Light Touch: Appears Intact Hot/Cold: Appears Intact Proprioception: Appears Intact Stereognosis: Appears Intact Coordination Gross Motor Movements are Fluid and Coordinated: No Fine Motor Movements are Fluid and Coordinated: No Coordination and Movement Description: LUE/LLE ataxia Finger Nose Finger Test: pt can complete with slight dysmetria  Motor  Motor Motor: Ataxia Motor - Discharge Observations: decreased coordination on the L   Mobility Bed Mobility Bed Mobility: Rolling Right;Supine to Sit;Sit to Supine Rolling Right: Independent with assistive device Supine to Sit: Supervision/Verbal cueing Sit to Supine: Supervision/Verbal cueing Transfers Transfers: Stand Pivot Transfers;Sit to Stand;Stand to Sit Sit to Stand: Supervision/Verbal cueing Stand to Sit: Supervision/Verbal cueing Stand Pivot Transfers: Supervision/Verbal cueing Transfer (Assistive device): Rollator Locomotion  Gait Ambulation: Yes Gait Assistance: Supervision/Verbal cueing Gait Distance (Feet): 160 Feet Assistive device: Rollator Gait Gait: Yes Gait Pattern: Decreased stride length;Decreased step length - left High Level Ambulation High Level Ambulation: Side stepping;Backwards walking Side Stepping: wtih 1-2 UE support and S Backwards  Walking: with 1 UE support and S Stairs / Additional Locomotion Stairs: Yes Stairs Assistance: Contact Guard/Touching assist Stair Management Technique: One rail Left;Sideways Number of Stairs: 10 Height of Stairs: 6 Ramp: Contact Guard/touching assist Wheelchair  Mobility Wheelchair Mobility: No  Trunk/Postural Assessment  Cervical Assessment Cervical Assessment: Exceptions to WFL(tends to keep head/neck very stiff and c/o neck fatigue) Thoracic Assessment Thoracic Assessment: Within Functional Limits Lumbar Assessment Lumbar Assessment: Within Functional Limits Postural Control Postural Control: Within Functional Limits(with some trunk stiffness due to dizziness)  Balance Static Sitting Balance Static Sitting - Level of Assistance: 7: Independent Dynamic Sitting Balance Dynamic Sitting - Level of Assistance: 6: Modified independent (Device/Increase time) Static Standing Balance Static Standing - Level of Assistance: 5: Stand by assistance Dynamic Standing Balance Dynamic Standing - Level of Assistance: 5: Stand by assistance Extremity Assessment      RLE Assessment Active Range of Motion (AROM) Comments: WFL General Strength Comments: grossly 4/5 LLE Assessment Active Range of Motion (AROM) Comments: WFL General Strength Comments: grossly 4-/5 with some coodination deficits noted    Jamison Oka, PT 11/30/2018

## 2018-11-29 NOTE — Progress Notes (Signed)
Patient continues to refuse Vitamin C, Iron, and Muscle Rub. MD aware of patient status.

## 2018-11-29 NOTE — Progress Notes (Signed)
[facility] PHYSICAL MEDICINE & REHABILITATION PROGRESS NOTE  Subjective/Complaints: Patient seen sitting up in bed this morning.  She states she slept well overnight.  She states she had a fair weekend.  She states she is ready for discharge tomorrow.  ROS: Denies CP, shortness of breath, diarrhea.  Objective: Vital Signs: Blood pressure 109/63, pulse 80, temperature 98.5 F (36.9 C), temperature source Oral, resp. rate 15, height 5\' 2"  (1.575 m), weight 59.8 kg, SpO2 96 %. No results found. No results for input(s): WBC, HGB, HCT, PLT in the last 72 hours. No results for input(s): NA, K, CL, CO2, GLUCOSE, BUN, CREATININE, CALCIUM in the last 72 hours.  Physical Exam: BP 109/63 (BP Location: Left Arm)   Pulse 80   Temp 98.5 F (36.9 C) (Oral)   Resp 15   Ht 5\' 2"  (1.575 m)   Wt 59.8 kg   SpO2 96%   BMI 24.11 kg/m  Constitutional: No distress . Vital signs reviewed. HENT: Normocephalic.  Atraumatic.   Eyes: EOMI.  No discharge. Cardiovascular: No JVD. Respiratory: Normal effort. GI: Non-distended. Musc: No edema or tenderness in extremities. Neurological: Alert and oriented. Motor: Right upper extremity/right lower extremity: 5/5 proximal distal, stable Left upper extremity/left lower extremity: 4-4+/5 proximal distal, stable HOH Skin: Skin is warm and dry.  Psychiatric: She has a normal mood and affect. Her behavior is normal.   Assessment/Plan: 1. Functional deficits secondary to bilateral brain infarcts as well as right brain bleeds which require 3+ hours per day of interdisciplinary therapy in a comprehensive inpatient rehab setting.  Physiatrist is providing close team supervision and 24 hour management of active medical problems listed below.  Physiatrist and rehab team continue to assess barriers to discharge/monitor patient progress toward functional and medical goals  Care Tool:  Bathing    Body parts bathed by patient: Right arm, Left arm, Chest, Abdomen,  Front perineal area, Buttocks, Right upper leg, Left upper leg, Right lower leg, Left lower leg, Face         Bathing assist Assist Level: Supervision/Verbal cueing     Upper Body Dressing/Undressing Upper body dressing   What is the patient wearing?: Bra, Pull over shirt    Upper body assist Assist Level: Independent    Lower Body Dressing/Undressing Lower body dressing      What is the patient wearing?: Underwear/pull up     Lower body assist Assist for lower body dressing: Supervision/Verbal cueing     Toileting Toileting    Toileting assist Assist for toileting: Contact Guard/Touching assist     Transfers Chair/bed transfer  Transfers assist     Chair/bed transfer assist level: Contact Guard/Touching assist     Locomotion Ambulation   Ambulation assist      Assist level: Contact Guard/Touching assist Assistive device: Walker-rolling Max distance: 100'   Walk 10 feet activity   Assist     Assist level: Contact Guard/Touching assist Assistive device: Walker-rolling   Walk 50 feet activity   Assist Walk 50 feet with 2 turns activity did not occur: Safety/medical concerns  Assist level: Contact Guard/Touching assist Assistive device: Walker-rolling    Walk 150 feet activity   Assist Walk 150 feet activity did not occur: Safety/medical concerns  Assist level: Contact Guard/Touching assist Assistive device: Walker-rolling    Walk 10 feet on uneven surface  activity   Assist Walk 10 feet on uneven surfaces activity did not occur: Safety/medical concerns         Wheelchair  Assist Will patient use wheelchair at discharge?: No Type of Wheelchair: Manual    Wheelchair assist level: Supervision/Verbal cueing Max wheelchair distance: 85'    Wheelchair 50 feet with 2 turns activity    Assist        Assist Level: Supervision/Verbal cueing   Wheelchair 150 feet activity     Assist Wheelchair 150 feet activity  did not occur: Safety/medical concerns          Medical Problem List and Plan: 1.  Weakness, balance deficits with posterior lean secondary to bilateral infarcts as well as right brain bleeds.  Continue CIR  Weekend notes reviewed-stable  Plan for d/c tomorrow  Will see patient for transitional care management in 1-2 weeks post-discharge 2.  Antithrombotics: -DVT/anticoagulation:  Mechanical: Sequential compression devices, below knee Bilateral lower extremities             -antiplatelet therapy: ASA 3. Pain Management: Tylenol prn.  4. Mood: LCSW to follow for evaluation and support.              -antipsychotic agents: N/A 5. Neuropsych: This patient is capable of making decisions on her own behalf. 6. Skin/Wound Care: Routine pressure relief measures.  7. Fluids/Electrolytes/Nutrition: Routine I/Os.    BMP within acceptable range on 5/27 8. HTN: Monitor BP bid. On Cozaar PTA.  Cont Metoprolol  Controlled on 6/8  Monitor with increased mobility 9.  Dyslipidemia: No meds due to NASH  10. COPD: Continue Daliresp and Incruse daily.   11. Hypothyroid: On supplement.  12.Transient urinary retention:     PVRs showing borderline retention, unchanged 13.H/o anemia:   Hemoglobin 7.4 on 6/1, labs pending  Given iron infusion on 6/4  Iron with Vit C ordered on 6/2, decreased to daily on 6/4 due to nausea-patient refusing  Continue to monitor 14.  Prediabetes  Continue to monitor  Control on 6/5, CBGs DC'd 15.  Hypoalbuminemia  Supplement initiated on 5/28 16.  Constipation  Bowel regimen increased on 6/2, increased again on 6/8  LOS: 12 days A FACE TO FACE EVALUATION WAS PERFORMED   Stephanie Love 11/29/2018, 9:53 AM

## 2018-11-29 NOTE — Progress Notes (Signed)
Occupational Therapy Session Note  Patient Details  Name: Stephanie Love MRN: [id number] Date of Birth: 1935/07/20  Today's Date: 11/29/2018 OT Individual Time: 0930-1015 OT Individual Time Calculation (min): 45 min    Short Term Goals: Week 1:  OT Short Term Goal 1 (Week 1): Pt will transfer on and off toilet with min A. OT Short Term Goal 1 - Progress (Week 1): Met OT Short Term Goal 2 (Week 1): Pt will transfer on and off shower bench with min A. OT Short Term Goal 2 - Progress (Week 1): Met OT Short Term Goal 3 (Week 1): Pt will be able to stand with min A or less during LB self care. OT Short Term Goal 3 - Progress (Week 1): Met Week 2:  OT Short Term Goal 1 (Week 2): STG=LTG d/t ELOS    Skilled Therapeutic Interventions/Progress Updates:    Pt seen this session to practice transfers using rollator.  Pt stated she had a shower yesterday and did not need to bathe again today as she already had clean clothing on.  Pt ambulated into bathroom with close S (complaining of dizziness), she completed toilet transfer with S and then sat at sink with rollator for oral care/ grooming.  Improved awareness of positioning rollator, using brakes, etc.   Pt taken to therapy gym with a simulated bathtub set up. Practiced tub bench transfers with S.  Reviewed how to place the shower curtain to avoid water spillage, safety techniques with drying off.    Pt taken back to her room and pt requested to lay down as she was feeling tired. Pt transferred to bed and rested. Bed alarm set and all needs met.   Therapy Documentation Precautions:  Precautions Precautions: Fall Precaution Comments: ataxia Restrictions Weight Bearing Restrictions: No    Pain: Pain Assessment Pain Scale: 0-10 Pain Score: 0-No pain      Therapy/Group: Individual Therapy  Athelene Hursey 11/29/2018, 12:06 PM

## 2018-11-29 NOTE — Progress Notes (Signed)
Occupational Therapy Discharge Summary  Patient Details  Name: Stephanie Love MRN: [id number] Date of Birth: May 26, 1936     Patient has met 8 of 8 long term goals due to improved activity tolerance, improved balance, postural control, ability to compensate for deficits, functional use of  LEFT upper and LEFT lower extremity and improved coordination.  Patient to discharge at overall Supervision level. She has improved a great deal as she initially needed mod A with mobility at admission.  Patient's care partner is independent to provide the necessary physical assistance at discharge.    Reasons goals not met: n/a  Recommendation:  Patient will benefit from ongoing skilled OT services in home health setting to continue to advance functional skills in the area of BADL.  Equipment: BSC and TTB  Reasons for discharge: treatment goals met  Patient/family agrees with progress made and goals achieved: Yes  OT Discharge Precautions/Restrictions  Precautions Precaution Comments: ataxia, dizziness   Pain Pain Assessment Pain Scale: 0-10 Pain Score: 0-No pain ADL ADL ADL Comments: Supervision with BADLs and ADL transfers Vision Baseline Vision/History: No visual deficits Patient Visual Report: No change from baseline Eye Alignment: Within Functional Limits Ocular Range of Motion: Within Functional Limits Visual Fields: No apparent deficits Perception  Perception: Within Functional Limits Praxis Praxis: Intact Cognition Overall Cognitive Status: Within Functional Limits for tasks assessed Sensation Sensation Light Touch: Appears Intact Hot/Cold: Appears Intact Proprioception: Appears Intact Stereognosis: Appears Intact Coordination Gross Motor Movements are Fluid and Coordinated: No Fine Motor Movements are Fluid and Coordinated: No Coordination and Movement Description: LUE/LLE ataxia Finger Nose Finger Test: pt can complete with slight dysmetria  Motor  Motor Motor -  Discharge Observations: decreased coordinatin on the L  Mobility    S with transfers Trunk/Postural Assessment    improved postural control but continues to need S with balance and transfers due to dizziness Balance Static Sitting Balance Static Sitting - Level of Assistance: 7: Independent Dynamic Sitting Balance Dynamic Sitting - Level of Assistance: 5: Stand by assistance Static Standing Balance Static Standing - Level of Assistance: 5: Stand by assistance Dynamic Standing Balance Dynamic Standing - Level of Assistance: 5: Stand by assistance(with one hand support on Rolator) Extremity/Trunk Assessment RUE Assessment RUE Assessment: Within Functional Limits LUE Assessment LUE Assessment: Within Functional Limits General Strength Comments: pt reports improved L hand coordination   [place] 11/29/2018, 12:14 PM

## 2018-11-30 ENCOUNTER — Ambulatory Visit (HOSPITAL_COMMUNITY): Payer: Medicare Other

## 2018-11-30 ENCOUNTER — Encounter (HOSPITAL_COMMUNITY): Payer: Medicare Other | Admitting: Occupational Therapy

## 2018-11-30 MED ORDER — FERROUS SULFATE 325 (65 FE) MG PO TABS: 325.0000 mg | ORAL_TABLET | Freq: Every day | ORAL | 0 refills | Status: DC

## 2018-11-30 MED ORDER — POLYETHYLENE GLYCOL 3350 17 G PO PACK: 17.0000 g | PACK | Freq: Two times a day (BID) | ORAL | 0 refills | Status: DC

## 2018-11-30 MED ORDER — TIOTROPIUM BROMIDE MONOHYDRATE 1.25 MCG/ACT IN AERS: 2.0000 | INHALATION_SPRAY | Freq: Every day | RESPIRATORY_TRACT | Status: DC

## 2018-11-30 MED ORDER — METOPROLOL SUCCINATE ER 25 MG PO TB24: 25.0000 mg | ORAL_TABLET | Freq: Every day | ORAL | 1 refills | Status: DC

## 2018-11-30 NOTE — Progress Notes (Signed)
Occupational Therapy Session Note  Patient Details  Name: TRENDA CORLISS MRN: [id number] Date of Birth: 1936-02-17  Today's Date: 11/30/2018 OT Individual Time: 0900-1000 OT Individual Time Calculation (min): 60 min    Short Term Goals: Week 1:  OT Short Term Goal 1 (Week 1): Pt will transfer on and off toilet with min A. OT Short Term Goal 1 - Progress (Week 1): Met OT Short Term Goal 2 (Week 1): Pt will transfer on and off shower bench with min A. OT Short Term Goal 2 - Progress (Week 1): Met OT Short Term Goal 3 (Week 1): Pt will be able to stand with min A or less during LB self care. OT Short Term Goal 3 - Progress (Week 1): Met Week 2:  OT Short Term Goal 1 (Week 2): STG=LTG d/t ELOS      Skilled Therapeutic Interventions/Progress Updates:    Pt seen this session for family education with her pt's daughter. Pt stated she was feeling dizzier today but was able to participate the entire session. Reviewed how pt is able to do self care, transfers with close S.  Practiced with return demonstration sit to stands, transfers, and turning with rollator, and tub bench transfers with S and with a focus on visual fixation.  Discussed where to place grab bars in tub, placement of shower head and curtain, safety strategies with getting out of the tub.   Pt and her daughter are prepared to go home today.     Therapy Documentation Precautions:  Precautions Precautions: Fall Precaution Comments: ataxia, dizziness Restrictions Weight Bearing Restrictions: No    Vital Signs: Therapy Vitals Pulse Rate: 92 Resp: 18 Patient Position (if appropriate): Lying Oxygen Therapy SpO2: 98 % O2 Device: Room Air Pain: Pain Assessment Pain Scale: 0-10 Pain Score: 0-No pain ADL: ADL ADL Comments: Supervision with BADLs and ADL transfers  Therapy/Group: Individual Therapy  [place] 11/30/2018, 12:12 PM

## 2018-11-30 NOTE — Discharge Instructions (Signed)
Inpatient Rehab Discharge Instructions  Stephanie Love Discharge date and time:  11/30/18  Activities/Precautions/ Functional Status: Activity: no lifting, driving, or strenuous exercise. Diet: cardiac diet/ Monitor sweets/starches due to pre-diabetes.   Wound Care: none needed   Functional status:  ___ No restrictions     ___ Walk up steps independently _X__ 24/7 supervision/assistance   ___ Walk up steps with assistance ___ Intermittent supervision/assistance  ___ Bathe/dress independently __X_ Walk with walker    __X_ Bathe/dress with assistance ___ Walk Independently    ___ Shower independently ___ Walk with assistance    ___ Shower with assistance _X__ No alcohol     ___ Return to work/school ________   Special Instructions:   COMMUNITY REFERRALS UPON DISCHARGE:    Home Health:   PT & OT   Agency:[facility]   [place]   Date of last service:11/30/2018  Medical Equipment/Items Ordered:ROLLATOR Vassie Moselle, 3 IN 1 & TUB BENCH  Agency/Supplier:[facility]  [phone]   GENERAL COMMUNITY RESOURCES FOR PATIENT/FAMILY: Support Groups:[facility] SUPPORT [facility] THE SECOND Thursday @ 6:00-7;00 PM ( SEPT-MAY ) ON THE REHAB UNIT QUESTIONS CONTACT AMY [phone]  STROKE/TIA DISCHARGE INSTRUCTIONS SMOKING Cigarette smoking nearly doubles your risk of having a stroke & is the single most alterable risk factor  If you smoke or have smoked in the last 12 months, you are advised to quit smoking for your health.  Most of the excess cardiovascular risk related to smoking disappears within a year of stopping.  Ask you doctor about anti-smoking medications  [place] Quit Line: 1-800-QUIT NOW  Free Smoking Cessation Classes [phone]  CHOLESTEROL Know your levels; limit fat & cholesterol in your diet  Lipid Panel     Component Value Date/Time   CHOL 124 11/14/2018 0434   TRIG 40 11/14/2018 0434   HDL 51 11/14/2018 0434   CHOLHDL 2.4 11/14/2018 0434   VLDL 8  11/14/2018 0434   LDLCALC 65 11/14/2018 0434      Many patients benefit from treatment even if their cholesterol is at goal.  Goal: Total Cholesterol (CHOL) less than 160  Goal:  Triglycerides (TRIG) less than 150  Goal:  HDL greater than 40  Goal:  LDL (LDLCALC) less than 100   BLOOD PRESSURE American Stroke Association blood pressure target is less that 120/80 mm/Hg  Your discharge blood pressure is:  BP: (!) 104/51  Monitor your blood pressure  Limit your salt and alcohol intake  Many individuals will require more than one medication for high blood pressure  DIABETES (A1c is a blood sugar average for last 3 months) Goal HGBA1c is under 7% (HBGA1c is blood sugar average for last 3 months)  Diabetes: No known diagnosis of diabetes  --pre diabetic  Lab Results  Component Value Date   HGBA1C 5.9 (H) 11/14/2018     Your HGBA1c can be lowered with medications, healthy diet, and exercise.  Check your blood sugar as directed by your physician  Call your physician if you experience unexplained or low blood sugars.  PHYSICAL ACTIVITY/REHABILITATION Goal is 30 minutes at least 4 days per week  Activity: No driving, Therapies: see above Return to work: N/A  Activity decreases your risk of heart attack and stroke and makes your heart stronger.  It helps control your weight and blood pressure; helps you relax and can improve your mood.  Participate in a regular exercise program.  Talk with your doctor about the best form of exercise for you (dancing, walking, swimming, cycling).  DIET/WEIGHT Goal is to maintain a healthy weight  Your discharge diet is:  Diet Order            Diet Heart Room service appropriate? Yes; Fluid consistency: Thin  Diet effective now            liquids Your height is:  Height: 5\' 2"  (157.5 cm) Your current weight is: Weight: 60.4 kg Your Body Mass Index (BMI) is:  BMI (Calculated): 24.35  Following the type of diet specifically designed for you  will help prevent another stroke.  You are at goal weight   Your goal Body Mass Index (BMI) is 19-24.  Healthy food habits can help reduce 3 risk factors for stroke:  High cholesterol, hypertension, and excess weight.  RESOURCES Stroke/Support Group:  Call [phone]   STROKE EDUCATION PROVIDED/REVIEWED AND GIVEN TO PATIENT Stroke warning signs and symptoms How to activate emergency medical system (call 911). Medications prescribed at discharge. Need for follow-up after discharge. Personal risk factors for stroke. Pneumonia vaccine given:  Flu vaccine given:  My questions have been answered, the writing is legible, and I understand these instructions.  I will adhere to these goals & educational materials that have been provided to me after my discharge from the hospital.     My questions have been answered and I understand these instructions. I will adhere to these goals and the provided educational materials after my discharge from the hospital.  Patient/Caregiver Signature _______________________________ Date __________  Clinician Signature _______________________________________ Date __________  Please bring this form and your medication list with you to all your follow-up doctor's appointments.

## 2018-11-30 NOTE — Progress Notes (Signed)
Patient discharge today home. Patient vitals are stable and patient alert and oriented. Patient homes medications returned to patient. Patient left via wheelchair with daughter and nursing staff.

## 2018-11-30 NOTE — Progress Notes (Signed)
Social Work  Discharge Note  The overall goal for the admission was met for:   Discharge location: [place] [facility]  Length of Stay: Yes-13 DAYS  Discharge activity level: Yes-SUPERVISION-CGA  Home/community participation: Yes  Services provided included: MD, RD, PT, OT, SLP, RN, CM, Pharmacy and SW  Financial Services: Medicare  Follow-up services arranged: Home Health: [facility]-PT & OT, DME: [facility]-ROLLATOR ROLLING WALKER, 3 IN 1 AND TUB BENCH and Patient/Family has no preference for HH/DME agencies  Comments (or additional information):DAUGHTER WAS HERE FOR EDUCATION DAY OF DC AND FELT IT WENT WELL AND AWARE OF THE 24 HR CARE NEED  Patient/Family verbalized understanding of follow-up arrangements: Yes  Individual responsible for coordination of the follow-up plan: SHERRY-DAUGHTER  Confirmed correct DME delivered: Elease Hashimoto 11/30/2018    Elease Hashimoto

## 2018-12-02 ENCOUNTER — Telehealth: Payer: Self-pay | Admitting: Registered Nurse

## 2018-12-02 NOTE — Telephone Encounter (Signed)
Transitional Care call Transitional Questions Answered by Judeen Hammans- Daughter  Patient name: Stephanie Love  DOB: 04-17-1936 1. Are you/is patient experiencing any problems since coming home? No a. Are there any questions regarding any aspect of care? No 2. Are there any questions regarding medications administration/dosing? No a. Are meds being taken as prescribed? Yes b. "Patient should review meds with caller to confirm" Medication List Reviewed 3. Have there been any falls? No 4. Has Home Health been to the house and/or have they contacted you? Yes, [facility] a. If not, have you tried to contact them? NA b. Can we help you contact them? No 5. Are bowels and bladder emptying properly? Yes a. Are there any unexpected incontinence issues? No b. If applicable, is patient following bowel/bladder programs? NA 6. Any fevers, problems with breathing, unexpected pain? No 7. Are there any skin problems or new areas of breakdown? No 8. Has the patient/family member arranged specialty MD follow up (ie cardiology/neurology/renal/surgical/etc.)?  Ms. Sherry instructed to call [facility] to schedule HFU appointment, she verbalizes understanding.  a. Can we help arrange? No 9. Does the patient need any other services or support that we can help arrange? No 10. Are caregivers following through as expected in assisting the patient? Yes 11. Has the patient quit smoking, drinking alcohol, or using drugs as recommended? (                        )  Appointment date/time Telephone Call, with Dr. Posey Pronto 12/13/2018 11:00 appointment. At [place]

## 2018-12-03 DIAGNOSIS — E039 Hypothyroidism, unspecified: Secondary | ICD-10-CM | POA: Diagnosis not present

## 2018-12-03 DIAGNOSIS — D649 Anemia, unspecified: Secondary | ICD-10-CM | POA: Diagnosis not present

## 2018-12-03 DIAGNOSIS — Z9181 History of falling: Secondary | ICD-10-CM | POA: Diagnosis not present

## 2018-12-03 DIAGNOSIS — E785 Hyperlipidemia, unspecified: Secondary | ICD-10-CM | POA: Diagnosis not present

## 2018-12-03 DIAGNOSIS — I69354 Hemiplegia and hemiparesis following cerebral infarction affecting left non-dominant side: Secondary | ICD-10-CM | POA: Diagnosis not present

## 2018-12-03 DIAGNOSIS — J449 Chronic obstructive pulmonary disease, unspecified: Secondary | ICD-10-CM | POA: Diagnosis not present

## 2018-12-03 DIAGNOSIS — I10 Essential (primary) hypertension: Secondary | ICD-10-CM | POA: Diagnosis not present

## 2018-12-07 ENCOUNTER — Telehealth: Payer: Self-pay | Admitting: Cardiology

## 2018-12-07 DIAGNOSIS — E785 Hyperlipidemia, unspecified: Secondary | ICD-10-CM | POA: Diagnosis not present

## 2018-12-07 DIAGNOSIS — I10 Essential (primary) hypertension: Secondary | ICD-10-CM | POA: Diagnosis not present

## 2018-12-07 DIAGNOSIS — E039 Hypothyroidism, unspecified: Secondary | ICD-10-CM | POA: Diagnosis not present

## 2018-12-07 DIAGNOSIS — J449 Chronic obstructive pulmonary disease, unspecified: Secondary | ICD-10-CM | POA: Diagnosis not present

## 2018-12-07 DIAGNOSIS — D649 Anemia, unspecified: Secondary | ICD-10-CM | POA: Diagnosis not present

## 2018-12-07 DIAGNOSIS — I69354 Hemiplegia and hemiparesis following cerebral infarction affecting left non-dominant side: Secondary | ICD-10-CM | POA: Diagnosis not present

## 2018-12-07 NOTE — Telephone Encounter (Signed)
LMOVM for pt to return call. Need to re educate patient about when to send manual transmissions and that she doesn't have to do it every day.

## 2018-12-07 NOTE — Telephone Encounter (Signed)
I spoke with pt and educated her on when to send a transmission and when not to. The pt verbalized understanding

## 2018-12-08 ENCOUNTER — Telehealth: Payer: Self-pay

## 2018-12-08 DIAGNOSIS — E785 Hyperlipidemia, unspecified: Secondary | ICD-10-CM | POA: Diagnosis not present

## 2018-12-08 DIAGNOSIS — J449 Chronic obstructive pulmonary disease, unspecified: Secondary | ICD-10-CM | POA: Diagnosis not present

## 2018-12-08 DIAGNOSIS — E039 Hypothyroidism, unspecified: Secondary | ICD-10-CM | POA: Diagnosis not present

## 2018-12-08 DIAGNOSIS — D649 Anemia, unspecified: Secondary | ICD-10-CM | POA: Diagnosis not present

## 2018-12-08 DIAGNOSIS — I10 Essential (primary) hypertension: Secondary | ICD-10-CM | POA: Diagnosis not present

## 2018-12-08 DIAGNOSIS — I69354 Hemiplegia and hemiparesis following cerebral infarction affecting left non-dominant side: Secondary | ICD-10-CM | POA: Diagnosis not present

## 2018-12-08 NOTE — Telephone Encounter (Signed)
[name], PT from [facility] called requesting verbal orders for HHPT 1wk1, 2wk3, 1wk3. Orders approved and given per discharge summary.

## 2018-12-09 DIAGNOSIS — Z9181 History of falling: Secondary | ICD-10-CM | POA: Diagnosis not present

## 2018-12-09 DIAGNOSIS — E039 Hypothyroidism, unspecified: Secondary | ICD-10-CM | POA: Diagnosis not present

## 2018-12-09 DIAGNOSIS — I69354 Hemiplegia and hemiparesis following cerebral infarction affecting left non-dominant side: Secondary | ICD-10-CM | POA: Diagnosis not present

## 2018-12-09 DIAGNOSIS — J449 Chronic obstructive pulmonary disease, unspecified: Secondary | ICD-10-CM | POA: Diagnosis not present

## 2018-12-09 DIAGNOSIS — D649 Anemia, unspecified: Secondary | ICD-10-CM | POA: Diagnosis not present

## 2018-12-09 DIAGNOSIS — I10 Essential (primary) hypertension: Secondary | ICD-10-CM | POA: Diagnosis not present

## 2018-12-09 DIAGNOSIS — E785 Hyperlipidemia, unspecified: Secondary | ICD-10-CM | POA: Diagnosis not present

## 2018-12-10 ENCOUNTER — Telehealth: Payer: Self-pay | Admitting: *Deleted

## 2018-12-10 DIAGNOSIS — J449 Chronic obstructive pulmonary disease, unspecified: Secondary | ICD-10-CM | POA: Diagnosis not present

## 2018-12-10 DIAGNOSIS — I69354 Hemiplegia and hemiparesis following cerebral infarction affecting left non-dominant side: Secondary | ICD-10-CM | POA: Diagnosis not present

## 2018-12-10 DIAGNOSIS — E039 Hypothyroidism, unspecified: Secondary | ICD-10-CM | POA: Diagnosis not present

## 2018-12-10 DIAGNOSIS — I10 Essential (primary) hypertension: Secondary | ICD-10-CM | POA: Diagnosis not present

## 2018-12-10 DIAGNOSIS — E785 Hyperlipidemia, unspecified: Secondary | ICD-10-CM | POA: Diagnosis not present

## 2018-12-10 DIAGNOSIS — I69359 Hemiplegia and hemiparesis following cerebral infarction affecting unspecified side: Secondary | ICD-10-CM | POA: Diagnosis not present

## 2018-12-10 DIAGNOSIS — I69398 Other sequelae of cerebral infarction: Secondary | ICD-10-CM | POA: Diagnosis not present

## 2018-12-10 DIAGNOSIS — D649 Anemia, unspecified: Secondary | ICD-10-CM | POA: Diagnosis not present

## 2018-12-10 DIAGNOSIS — Z6823 Body mass index (BMI) 23.0-23.9, adult: Secondary | ICD-10-CM | POA: Diagnosis not present

## 2018-12-10 NOTE — Telephone Encounter (Signed)
Izora Gala OT called and is requesting to have OT POC 1xmonthx2 months to reasses after PT has completed. Approval given.

## 2018-12-13 ENCOUNTER — Encounter: Payer: Self-pay | Admitting: Physical Medicine & Rehabilitation

## 2018-12-13 ENCOUNTER — Other Ambulatory Visit: Payer: Self-pay

## 2018-12-13 ENCOUNTER — Encounter: Payer: Medicare Other | Attending: Physical Medicine & Rehabilitation | Admitting: Physical Medicine & Rehabilitation

## 2018-12-13 VITALS — BP 120/68 | HR 74 | Temp 99.1°F | Ht 62.0 in | Wt 128.0 lb

## 2018-12-13 DIAGNOSIS — R269 Unspecified abnormalities of gait and mobility: Secondary | ICD-10-CM | POA: Diagnosis not present

## 2018-12-13 DIAGNOSIS — D638 Anemia in other chronic diseases classified elsewhere: Secondary | ICD-10-CM | POA: Diagnosis not present

## 2018-12-13 DIAGNOSIS — I69398 Other sequelae of cerebral infarction: Secondary | ICD-10-CM | POA: Diagnosis not present

## 2018-12-13 DIAGNOSIS — D508 Other iron deficiency anemias: Secondary | ICD-10-CM | POA: Diagnosis not present

## 2018-12-13 DIAGNOSIS — I10 Essential (primary) hypertension: Secondary | ICD-10-CM | POA: Diagnosis not present

## 2018-12-13 DIAGNOSIS — I6930 Unspecified sequelae of cerebral infarction: Secondary | ICD-10-CM

## 2018-12-13 HISTORY — DX: Unspecified abnormalities of gait and mobility: I69.398

## 2018-12-13 NOTE — Patient Instructions (Signed)
Please consider vestibular rehab

## 2018-12-13 NOTE — Progress Notes (Addendum)
Subjective:    Patient ID: Stephanie Love, female    DOB: June 29, 1935, 83 y.o.   MRN: [id number]  HPI 83 year old female with history of COPD, NASH, anemia presents for transitional care management after receiving CIR for bilateral infarcts and brain bleeds.  Daughter supplements history. At discharge she was instructed to have supervision, which she does.  She has not had lab work since being discharged. She saw her PCP.  She has an appointment with Cards, but not Neuro. BP is controlled. Denies problems with urination bowel movements. Denies falls.  Therapies: 2/week DME: Bedside commode, shower chair Mobility: Rollator at all times  Pain Inventory Average Pain 0 Pain Right Now 0 My pain is na  In the last 24 hours, has pain interfered with the following? General activity 0 Relation with others 0 Enjoyment of life 0 What TIME of day is your pain at its worst? na Sleep (in general) Good  Pain is worse with: na Pain improves with: na Relief from Meds: na  Mobility walk without assistance use a walker ability to climb steps?  yes do you drive?  no  Function retired I need assistance with the following:  bathing, meal prep, household duties and shopping  Neuro/Psych weakness tremor trouble walking dizziness  Prior Studies Any changes since last visit?  no  Physicians involved in your care Any changes since last visit?  no   Family History  Problem Relation Age of Onset  . Cancer Mother   . Diabetes Father   . Heart disease Father    Social History   Socioeconomic History  . Marital status: Widowed    Spouse name: Not on file  . Number of children: Not on file  . Years of education: Not on file  . Highest education level: Not on file  Occupational History  . Not on file  Social Needs  . Financial resource strain: Not very hard  . Food insecurity    Worry: Never true    Inability: Never true  . Transportation needs    Medical: No   Non-medical: No  Tobacco Use  . Smoking status: Former Smoker    Types: Cigarettes    Quit date: 11/16/1981    Years since quitting: 37.0  . Smokeless tobacco: Never Used  Substance and Sexual Activity  . Alcohol use: Not Currently  . Drug use: Never  . Sexual activity: Not Currently  Lifestyle  . Physical activity    Days per week: 0 days    Minutes per session: 0 min  . Stress: Not at all  Relationships  . Social Herbalist on phone: Twice a week    Gets together: Twice a week    Attends religious service: 1 to 4 times per year    Active member of club or organization: Yes    Attends meetings of clubs or organizations: 1 to 4 times per year    Relationship status: Widowed  Other Topics Concern  . Not on file  Social History Narrative  . Not on file   Past Surgical History:  Procedure Laterality Date  . ABDOMINAL HYSTERECTOMY  2012  . AMPUTATION TOE     right 2nd toe   . CHOLECYSTECTOMY  2013  . LOOP RECORDER INSERTION N/A 11/16/2018   Procedure: LOOP RECORDER INSERTION;  Surgeon: Thompson Grayer, MD;  Location: [facility];  Service: Cardiovascular;  Laterality: N/A;   Past Medical History:  Diagnosis Date  . Anemia   .  Arthritis   . COPD (chronic obstructive pulmonary disease) ([place])   . Diverticulosis   . GERD (gastroesophageal reflux disease)   . Hepatitis A virus infection 1963  . Hypertension   . Hypothyroid   . Microhematuria   . NASH (nonalcoholic steatohepatitis)   . Osteoarthritis   . Stroke ([place]) 11/13/2018  . UTI (urinary tract infection)    BP 120/68   Pulse 74   Temp 99.1 F (37.3 C)   Ht 5\' 2"  (1.575 m)   Wt 128 lb (58.1 kg)   SpO2 97%   BMI 23.41 kg/m   Opioid Risk Score:   Fall Risk Score:  `1  Depression screen PHQ 2/9  Depression screen PHQ 2/9 08/10/2018  Decreased Interest 0  Down, Depressed, Hopeless 1  PHQ - 2 Score 1     Review of Systems  Constitutional: Negative.   HENT: Negative.   Eyes: Negative.    Respiratory: Negative.   Cardiovascular: Negative.   Gastrointestinal: Negative.   Endocrine: Negative.   Genitourinary: Negative.   Musculoskeletal: Positive for gait problem.  Skin: Negative.   Allergic/Immunologic: Negative.   Neurological: Positive for dizziness, tremors and weakness.  Hematological: Negative.   Psychiatric/Behavioral: Negative.   All other systems reviewed and are negative.      Objective:   Physical Exam Constitutional: No distress . Vital signs reviewed. HENT: Normocephalic.  Atraumatic.   Eyes: EOMI. No discharge. Cardiovascular: No JVD. Respiratory: Normal effort. GI: Non-distended. Musc: No edema or tenderness in extremities. Neurological: Alert and oriented. Motor: Right upper extremity/right lower extremity: 5/5 proximal distal Left upper extremity/left lower extremity: 4+-5/5 proximal distal, stable HOH Skin: Skin is warm and dry.  Psychiatric: She has a normal mood and affect. Her behavior is normal.     Assessment & Plan:  83 year old female with history of COPD, NASH, anemia presents for transitional care management after receiving CIR for bilateral infarcts and brain bleeds.  1. Weakness, balance deficits with posterior lean secondary to bilateral infarcts as well as right brain bleeds.             Continue therapies  Follow up with Cards  Needs Neuro appointment  2. HTN:   Cont meds  Controlled today  3.H/o anemia:   Encouraged follow up with PCP regarding CBC as well iron transfusions  4. Gait abnormality  Cont therapies  Cont rollator for safety  Meds reviewed Referrals reviewed - Needs Neuro appointment All questions answered

## 2018-12-14 DIAGNOSIS — M1711 Unilateral primary osteoarthritis, right knee: Secondary | ICD-10-CM | POA: Diagnosis not present

## 2018-12-15 DIAGNOSIS — J449 Chronic obstructive pulmonary disease, unspecified: Secondary | ICD-10-CM | POA: Diagnosis not present

## 2018-12-15 DIAGNOSIS — I69354 Hemiplegia and hemiparesis following cerebral infarction affecting left non-dominant side: Secondary | ICD-10-CM | POA: Diagnosis not present

## 2018-12-15 DIAGNOSIS — I10 Essential (primary) hypertension: Secondary | ICD-10-CM | POA: Diagnosis not present

## 2018-12-15 DIAGNOSIS — E039 Hypothyroidism, unspecified: Secondary | ICD-10-CM | POA: Diagnosis not present

## 2018-12-15 DIAGNOSIS — E785 Hyperlipidemia, unspecified: Secondary | ICD-10-CM | POA: Diagnosis not present

## 2018-12-15 DIAGNOSIS — D649 Anemia, unspecified: Secondary | ICD-10-CM | POA: Diagnosis not present

## 2018-12-16 ENCOUNTER — Telehealth: Payer: Self-pay

## 2018-12-16 DIAGNOSIS — I6930 Unspecified sequelae of cerebral infarction: Secondary | ICD-10-CM

## 2018-12-16 DIAGNOSIS — I69398 Other sequelae of cerebral infarction: Secondary | ICD-10-CM

## 2018-12-16 NOTE — Telephone Encounter (Signed)
We can order vestibular rehab.  Thanks.

## 2018-12-16 NOTE — Telephone Encounter (Signed)
Darlene from [place] at [facility] stating they need orders for [place] rehab for patient

## 2018-12-17 DIAGNOSIS — E785 Hyperlipidemia, unspecified: Secondary | ICD-10-CM | POA: Diagnosis not present

## 2018-12-17 DIAGNOSIS — I69354 Hemiplegia and hemiparesis following cerebral infarction affecting left non-dominant side: Secondary | ICD-10-CM | POA: Diagnosis not present

## 2018-12-17 DIAGNOSIS — J449 Chronic obstructive pulmonary disease, unspecified: Secondary | ICD-10-CM | POA: Diagnosis not present

## 2018-12-17 DIAGNOSIS — I10 Essential (primary) hypertension: Secondary | ICD-10-CM | POA: Diagnosis not present

## 2018-12-17 DIAGNOSIS — E039 Hypothyroidism, unspecified: Secondary | ICD-10-CM | POA: Diagnosis not present

## 2018-12-17 DIAGNOSIS — D649 Anemia, unspecified: Secondary | ICD-10-CM | POA: Diagnosis not present

## 2018-12-17 NOTE — Telephone Encounter (Signed)
Order placed

## 2018-12-17 NOTE — Addendum Note (Signed)
Addended by: Caro Hight on: 12/17/2018 12:42 PM   Modules accepted: Orders

## 2018-12-20 ENCOUNTER — Ambulatory Visit (INDEPENDENT_AMBULATORY_CARE_PROVIDER_SITE_OTHER): Payer: Medicare Other | Admitting: *Deleted

## 2018-12-20 DIAGNOSIS — I63119 Cerebral infarction due to embolism of unspecified vertebral artery: Secondary | ICD-10-CM

## 2018-12-20 LAB — CUP PACEART REMOTE DEVICE CHECK
Date Time Interrogation Session: 20200628161155
Implantable Pulse Generator Implant Date: 20200526

## 2018-12-21 DIAGNOSIS — E785 Hyperlipidemia, unspecified: Secondary | ICD-10-CM | POA: Diagnosis not present

## 2018-12-21 DIAGNOSIS — I10 Essential (primary) hypertension: Secondary | ICD-10-CM | POA: Diagnosis not present

## 2018-12-21 DIAGNOSIS — I69354 Hemiplegia and hemiparesis following cerebral infarction affecting left non-dominant side: Secondary | ICD-10-CM | POA: Diagnosis not present

## 2018-12-21 DIAGNOSIS — J449 Chronic obstructive pulmonary disease, unspecified: Secondary | ICD-10-CM | POA: Diagnosis not present

## 2018-12-21 DIAGNOSIS — I69359 Hemiplegia and hemiparesis following cerebral infarction affecting unspecified side: Secondary | ICD-10-CM | POA: Diagnosis not present

## 2018-12-21 DIAGNOSIS — D649 Anemia, unspecified: Secondary | ICD-10-CM | POA: Diagnosis not present

## 2018-12-21 DIAGNOSIS — E039 Hypothyroidism, unspecified: Secondary | ICD-10-CM | POA: Diagnosis not present

## 2018-12-21 DIAGNOSIS — I69398 Other sequelae of cerebral infarction: Secondary | ICD-10-CM | POA: Diagnosis not present

## 2018-12-22 HISTORY — PX: PARTIAL HIP ARTHROPLASTY: SHX733

## 2018-12-23 DIAGNOSIS — D649 Anemia, unspecified: Secondary | ICD-10-CM | POA: Diagnosis not present

## 2018-12-23 DIAGNOSIS — E785 Hyperlipidemia, unspecified: Secondary | ICD-10-CM | POA: Diagnosis not present

## 2018-12-23 DIAGNOSIS — I10 Essential (primary) hypertension: Secondary | ICD-10-CM | POA: Diagnosis not present

## 2018-12-23 DIAGNOSIS — J449 Chronic obstructive pulmonary disease, unspecified: Secondary | ICD-10-CM | POA: Diagnosis not present

## 2018-12-23 DIAGNOSIS — I69354 Hemiplegia and hemiparesis following cerebral infarction affecting left non-dominant side: Secondary | ICD-10-CM | POA: Diagnosis not present

## 2018-12-23 DIAGNOSIS — E039 Hypothyroidism, unspecified: Secondary | ICD-10-CM | POA: Diagnosis not present

## 2018-12-30 DIAGNOSIS — J449 Chronic obstructive pulmonary disease, unspecified: Secondary | ICD-10-CM | POA: Diagnosis not present

## 2018-12-30 DIAGNOSIS — I69354 Hemiplegia and hemiparesis following cerebral infarction affecting left non-dominant side: Secondary | ICD-10-CM | POA: Diagnosis not present

## 2018-12-30 DIAGNOSIS — E039 Hypothyroidism, unspecified: Secondary | ICD-10-CM | POA: Diagnosis not present

## 2018-12-30 DIAGNOSIS — I10 Essential (primary) hypertension: Secondary | ICD-10-CM | POA: Diagnosis not present

## 2018-12-30 DIAGNOSIS — D649 Anemia, unspecified: Secondary | ICD-10-CM | POA: Diagnosis not present

## 2018-12-30 DIAGNOSIS — E785 Hyperlipidemia, unspecified: Secondary | ICD-10-CM | POA: Diagnosis not present

## 2018-12-31 NOTE — Progress Notes (Signed)
Carelink Summary Report / Loop Recorder 

## 2019-01-02 DIAGNOSIS — E785 Hyperlipidemia, unspecified: Secondary | ICD-10-CM | POA: Diagnosis not present

## 2019-01-02 DIAGNOSIS — I69354 Hemiplegia and hemiparesis following cerebral infarction affecting left non-dominant side: Secondary | ICD-10-CM | POA: Diagnosis not present

## 2019-01-02 DIAGNOSIS — E039 Hypothyroidism, unspecified: Secondary | ICD-10-CM | POA: Diagnosis not present

## 2019-01-02 DIAGNOSIS — J449 Chronic obstructive pulmonary disease, unspecified: Secondary | ICD-10-CM | POA: Diagnosis not present

## 2019-01-02 DIAGNOSIS — D649 Anemia, unspecified: Secondary | ICD-10-CM | POA: Diagnosis not present

## 2019-01-02 DIAGNOSIS — Z9181 History of falling: Secondary | ICD-10-CM | POA: Diagnosis not present

## 2019-01-02 DIAGNOSIS — I10 Essential (primary) hypertension: Secondary | ICD-10-CM | POA: Diagnosis not present

## 2019-01-03 DIAGNOSIS — D649 Anemia, unspecified: Secondary | ICD-10-CM | POA: Diagnosis not present

## 2019-01-03 DIAGNOSIS — J449 Chronic obstructive pulmonary disease, unspecified: Secondary | ICD-10-CM | POA: Diagnosis not present

## 2019-01-03 DIAGNOSIS — E039 Hypothyroidism, unspecified: Secondary | ICD-10-CM | POA: Diagnosis not present

## 2019-01-03 DIAGNOSIS — I69354 Hemiplegia and hemiparesis following cerebral infarction affecting left non-dominant side: Secondary | ICD-10-CM | POA: Diagnosis not present

## 2019-01-03 DIAGNOSIS — E785 Hyperlipidemia, unspecified: Secondary | ICD-10-CM | POA: Diagnosis not present

## 2019-01-03 DIAGNOSIS — I10 Essential (primary) hypertension: Secondary | ICD-10-CM | POA: Diagnosis not present

## 2019-01-04 ENCOUNTER — Ambulatory Visit (INDEPENDENT_AMBULATORY_CARE_PROVIDER_SITE_OTHER): Payer: Medicare Other | Admitting: Neurology

## 2019-01-04 ENCOUNTER — Other Ambulatory Visit: Payer: Self-pay

## 2019-01-04 ENCOUNTER — Telehealth: Payer: Self-pay

## 2019-01-04 ENCOUNTER — Encounter: Payer: Self-pay | Admitting: Neurology

## 2019-01-04 VITALS — BP 124/72 | HR 75 | Temp 97.1°F | Ht 62.0 in | Wt 130.4 lb

## 2019-01-04 DIAGNOSIS — I639 Cerebral infarction, unspecified: Secondary | ICD-10-CM

## 2019-01-04 NOTE — Progress Notes (Signed)
[facility] [address] [place]. [place] [address] [phone]       OFFICE FOLLOW-UP NOTE  [name] Date of Birth:  1935/11/04 Medical Record Number:  [id number]   HPI: Stephanie Love is a 83 year old pleasant Caucasian lady seen today for office first follow-up visit following hospital admission for stroke in May 2020.  She is accompanied by her daughter.  History is obtained from them, review of electronic medical records and I personally reviewed imaging films in PACS. Stephanie Smithis a 70 y.o.femalepast medical history of COPD, hypertension, hypothyroidism,GERDresents to the emergency room at [facility] with sudden onset dizzinessthat began around 6:30 PM on 11/14/18.She had nausea and vomited multiple times. She went to [facility] and was evaluated by tele neurology.On assessment,noted to have disconjugate gaze, dysarthria and left-sided ataxia.Risk versus benefits was discussed with patient and she is received IV TPA (start time was 9.22pm). Herblood pressure remained below 237 systolic per reports.She was transferred to [facility] for further management. Stroke work up completed, inc Loop monitor placed for long term ongoing cardiac monitoring in setting of cryptogenic stroke CT angiogram of the brain and neck were obtained at [facility] which showed no large vessel stenosis or occlusion.  MRI scan of the brain showed acute left superior cerebellar artery infarct as well as small acute bilateral parietal and right frontal infarcts.  New small volume right parietal subarachnoid hemorrhage and small right subdural hematoma without mass-effect.  2D echo showed normal ejection fraction without cardiac source of embolism.  LDL cholesterol 65 mg percent and hemoglobin A1c was 5.9.  Patient was seen by physical occupational speech therapy and felt to be good patient for inpatient rehab.  She had mild left-sided dysmetria at the time of  discharge.  Patient states she is done well.  She is presently at home living with her daughter.  She feels incoordination and dizziness have improved.  She is getting home physical and occupational therapy and making good steady progress.  She can walk indoors short distances by herself and uses a walker for long distances.  She has not started going to her own apartment during the daytime but spends the night in the daughter's home with her.  She is thinking about moving into her apartment full-time over the next few weeks.  She still feels her balance is not back to baseline and at times she has trouble controlling her left side.  She is tolerating aspirin well without bruising or bleeding.  She states her blood pressures well controlled today it is 129/72.  She has no new complaints. ROS:   14 system review of systems is positive for dizziness, gait imbalance, balance difficulties and all other systems negative  PMH:  Past Medical History:  Diagnosis Date  . Anemia   . Arthritis   . COPD (chronic obstructive pulmonary disease) ([place])   . Diverticulosis   . GERD (gastroesophageal reflux disease)   . Hepatitis A virus infection 1963  . Hypertension   . Hypothyroid   . Microhematuria   . NASH (nonalcoholic steatohepatitis)   . Osteoarthritis   . Stroke ([place]) 11/13/2018  . UTI (urinary tract infection)     Social History:  Social History   Socioeconomic History  . Marital status: Widowed    Spouse name: Not on file  . Number of children: Not on file  . Years of education: Not on file  . Highest education level: Not on file  Occupational History  . Not  on file  Social Needs  . Financial resource strain: Not very hard  . Food insecurity    Worry: Never true    Inability: Never true  . Transportation needs    Medical: No    Non-medical: No  Tobacco Use  . Smoking status: Former Smoker    Types: Cigarettes    Quit date: 11/16/1981    Years since quitting: 37.1  . Smokeless  tobacco: Never Used  Substance and Sexual Activity  . Alcohol use: Not Currently  . Drug use: Never  . Sexual activity: Not Currently  Lifestyle  . Physical activity    Days per week: 0 days    Minutes per session: 0 min  . Stress: Not at all  Relationships  . Social Herbalist on phone: Twice a week    Gets together: Twice a week    Attends religious service: 1 to 4 times per year    Active member of club or organization: Yes    Attends meetings of clubs or organizations: 1 to 4 times per year    Relationship status: Widowed  . Intimate partner violence    Fear of current or ex partner: Patient refused    Emotionally abused: Patient refused    Physically abused: Patient refused    Forced sexual activity: Patient refused  Other Topics Concern  . Not on file  Social History Narrative  . Not on file    Medications:   Current Outpatient Medications on File Prior to Visit  Medication Sig Dispense Refill  . aspirin EC 325 MG EC tablet Take 1 tablet (325 mg total) by mouth daily. 30 tablet 0  . cholecalciferol (VITAMIN D3) 25 MCG (1000 UT) tablet Take 1,000 Units by mouth daily.    Marland Kitchen escitalopram (LEXAPRO) 10 MG tablet Take 10 mg by mouth daily.    . lansoprazole (PREVACID) 15 MG capsule Take 15 mg by mouth daily at 12 noon.    Marland Kitchen levothyroxine (SYNTHROID, LEVOTHROID) 112 MCG tablet Take 112 mcg by mouth daily before breakfast.    . metoprolol succinate (TOPROL-XL) 25 MG 24 hr tablet Take 1 tablet (25 mg total) by mouth daily. 30 tablet 1  . roflumilast (DALIRESP) 500 MCG TABS tablet Take 500 mcg by mouth daily.     Marland Kitchen SPIRIVA HANDIHALER 18 MCG inhalation capsule     . furosemide (LASIX) 20 MG tablet TK 1 T PO D PRF SWELLING     No current facility-administered medications on file prior to visit.     Allergies:   Allergies  Allergen Reactions  . Penicillins Anaphylaxis    Did it involve swelling of the face/tongue/throat, SOB, or low BP? yes Did it involve sudden  or severe rash/hives, skin peeling, or any reaction on the inside of your mouth or nose? no Did you need to seek medical attention at a hospital or doctor's office? yes When did it last happen?pt was 20 If all above answers are "NO", may proceed with cephalosporin use.   . Codeine     hyper    Physical Exam General: Frail elderly Caucasian lady seated, in no evident distress Head: head normocephalic and atraumatic.  Neck: supple with no carotid or supraclavicular bruits Cardiovascular: regular rate and rhythm, no murmurs Musculoskeletal: no deformity except mild kyphoscoliosis Skin:  no rash/petichiae Vascular:  Normal pulses all extremities Vitals:   01/04/19 0852  BP: 124/72  Pulse: 75  Temp: (!) 97.1 F (36.2 C)   Neurologic  Exam Mental Status: Awake and fully alert. Oriented to place and time. Recent and remote memory intact. Attention span, concentration and fund of knowledge appropriate. Mood and affect appropriate.  Cranial Nerves: Fundoscopic exam reveals sharp disc margins. Pupils equal, briskly reactive to light. Extraocular movements full without nystagmus. Visual fields full to confrontation. Hearing intact. Facial sensation intact. Face, tongue, palate moves normally and symmetrically.  Motor: Normal bulk and tone. Normal strength in all tested extremity muscles. Sensory.: intact to touch ,pinprick .position and vibratory sensation.  Coordination: Rapid alternating movements normal in all extremities. Finger-to-nose and heel-to-shin performed accurately bilaterally. Gait and Station: Arises from chair without difficulty. Stance is slightly stooped.  Uses a wheeled walker.. Gait demonstrates normal stride length and slight imbalance .  Unable to perform tandem walking. Reflexes: 1+ and symmetric. Toes downgoing.   NIHSS  0 Modified Rankin  2  CTangio brain and neck  11/13/18 at [place]: 1. No evidence of acute intracranial abnormality. 2. Minimal intracranial  atherosclerosis without large vessel occlusion, significant stenosis, or aneurysm. 3. Widely patent cervical carotid and vertebral arteries. 4. Aortic Atherosclerosis (ICD10-I70.0) and Emphysema (ICD10-J43.9).  MRI Brain 11/14/2018: 1. 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 2. Punctate acute bilateral parietal and right frontal infarcts. 3. Small volume right parieto-occipital subarachnoid hemorrhage and small right-sided subdural hematoma without mass effect.   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  Vascular risk factors of hypertension and age only.     PLAN: I had a long d/w patient about his recent stroke, risk for recurrent stroke/TIAs, personally independently reviewed imaging studies and stroke evaluation results and answered questions.Continue aspirin 81 mg daily  for secondary stroke prevention and maintain strict control of hypertension with blood pressure goal below 130/90, diabetes with hemoglobin A1c goal below 6.5% and lipids with LDL cholesterol goal below 70 mg/dL. I also advised the patient to eat a healthy diet with plenty of whole grains, cereals, fruits and vegetables, exercise regularly and maintain ideal body weight.  She was advised to use a walker and we discussed fall and safety precautions.  She was advised to continue home physical and occupational therapy and perhaps consider outpatient therapy as well later.  She may consider possible participation in the [place] trial if interested and will be given information to review and decide.  Followup in the future with my nurse practitioner Janett Billow in 3 months or call earlier if necessary. Greater than 50% of time during this 25 minute visit was spent on counseling,explanation of diagnosis of cryptogenic stroke, planning of further management, discussion with patient and family and coordination  of care Antony Contras, MD  [facility] [address] [place], [place] [address]  Phone [phone] Fax [phone] Note: This document was prepared with digital dictation and possible smart phrase technology. Any transcriptional errors that result from this process are unintentional

## 2019-01-04 NOTE — Telephone Encounter (Signed)
I called pts daughter Judeen Hammans about pt not getting lab work that was order today. She stated her mom will be going to her PCP this week and they will email the results. The research team saw pt today and she will email them the results.

## 2019-01-04 NOTE — Patient Instructions (Signed)
I had a long d/w patient about his recent stroke, risk for recurrent stroke/TIAs, personally independently reviewed imaging studies and stroke evaluation results and answered questions.Continue aspirin 81 mg daily  for secondary stroke prevention and maintain strict control of hypertension with blood pressure goal below 130/90, diabetes with hemoglobin A1c goal below 6.5% and lipids with LDL cholesterol goal below 70 mg/dL. I also advised the patient to eat a healthy diet with plenty of whole grains, cereals, fruits and vegetables, exercise regularly and maintain ideal body weight.  She was advised to use a walker and we discussed fall and safety precautions.  She was advised to continue home physical and occupational therapy and perhaps consider outpatient therapy as well later.  She may consider possible participation in the [place] trial if interested and will be given information to review and decide.  Followup in the future with my nurse practitioner Janett Billow in 3 months or call earlier if necessary.   Fall Prevention in the Home, Adult Falls can cause injuries. They can happen to people of all ages. There are many things you can do to make your home safe and to help prevent falls. Ask for help when making these changes, if needed. What actions can I take to prevent falls? General Instructions  Use good lighting in all rooms. Replace any light bulbs that burn out.  Turn on the lights when you go into a dark area. Use night-lights.  Keep items that you use often in easy-to-reach places. Lower the shelves around your home if necessary.  Set up your furniture so you have a clear path. Avoid moving your furniture around.  Do not have throw rugs and other things on the floor that can make you trip.  Avoid walking on wet floors.  If any of your floors are uneven, fix them.  Add color or contrast paint or tape to clearly mark and help you see: ? Any grab bars or handrails. ? First and last steps  of stairways. ? Where the edge of each step is.  If you use a stepladder: ? Make sure that it is fully opened. Do not climb a closed stepladder. ? Make sure that both sides of the stepladder are locked into place. ? Ask someone to hold the stepladder for you while you use it.  If there are any pets around you, be aware of where they are. What can I do in the bathroom?      Keep the floor dry. Clean up any water that spills onto the floor as soon as it happens.  Remove soap buildup in the tub or shower regularly.  Use non-skid mats or decals on the floor of the tub or shower.  Attach bath mats securely with double-sided, non-slip rug tape.  If you need to sit down in the shower, use a plastic, non-slip stool.  Install grab bars by the toilet and in the tub and shower. Do not use towel bars as grab bars. What can I do in the bedroom?  Make sure that you have a light by your bed that is easy to reach.  Do not use any sheets or blankets that are too big for your bed. They should not hang down onto the floor.  Have a firm chair that has side arms. You can use this for support while you get dressed. What can I do in the kitchen?  Clean up any spills right away.  If you need to reach something above you, use a  strong step stool that has a grab bar.  Keep electrical cords out of the way.  Do not use floor polish or wax that makes floors slippery. If you must use wax, use non-skid floor wax. What can I do with my stairs?  Do not leave any items on the stairs.  Make sure that you have a light switch at the top of the stairs and the bottom of the stairs. If you do not have them, ask someone to add them for you.  Make sure that there are handrails on both sides of the stairs, and use them. Fix handrails that are broken or loose. Make sure that handrails are as long as the stairways.  Install non-slip stair treads on all stairs in your home.  Avoid having throw rugs at the top  or bottom of the stairs. If you do have throw rugs, attach them to the floor with carpet tape.  Choose a carpet that does not hide the edge of the steps on the stairway.  Check any carpeting to make sure that it is firmly attached to the stairs. Fix any carpet that is loose or worn. What can I do on the outside of my home?  Use bright outdoor lighting.  Regularly fix the edges of walkways and driveways and fix any cracks.  Remove anything that might make you trip as you walk through a door, such as a raised step or threshold.  Trim any bushes or trees on the path to your home.  Regularly check to see if handrails are loose or broken. Make sure that both sides of any steps have handrails.  Install guardrails along the edges of any raised decks and porches.  Clear walking paths of anything that might make someone trip, such as tools or rocks.  Have any leaves, snow, or ice cleared regularly.  Use sand or salt on walking paths during winter.  Clean up any spills in your garage right away. This includes grease or oil spills. What other actions can I take?  Wear shoes that: ? Have a low heel. Do not wear high heels. ? Have rubber bottoms. ? Are comfortable and fit you well. ? Are closed at the toe. Do not wear open-toe sandals.  Use tools that help you move around (mobility aids) if they are needed. These include: ? Canes. ? Walkers. ? Scooters. ? Crutches.  Review your medicines with your doctor. Some medicines can make you feel dizzy. This can increase your chance of falling. Ask your doctor what other things you can do to help prevent falls. Where to find more information  Centers for Disease Control and Prevention, STEADI: https://garcia.biz/  [employer] on Aging: BrainJudge.co.uk Contact a doctor if:  You are afraid of falling at home.  You feel weak, drowsy, or dizzy at home.  You fall at home. Summary  There are many simple things that you can  do to make your home safe and to help prevent falls.  Ways to make your home safe include removing tripping hazards and installing grab bars in the bathroom.  Ask for help when making these changes in your home. This information is not intended to replace advice given to you by your health care provider. Make sure you discuss any questions you have with your health care provider. Document Released: 04/05/2009 Document Revised: 09/30/2018 Document Reviewed: 01/22/2017 Elsevier Patient Education  2020 [employer].   Stroke Prevention Some medical conditions and behaviors are associated with a higher chance  of having a stroke. You can help prevent a stroke by making nutrition, lifestyle, and other changes, including managing any medical conditions you may have. What nutrition changes can be made?   Eat healthy foods. You can do this by: ? Choosing foods high in fiber, such as fresh fruits and vegetables and whole grains. ? Eating at least 5 or more servings of fruits and vegetables a day. Try to fill half of your plate at each meal with fruits and vegetables. ? Choosing lean protein foods, such as lean cuts of meat, poultry without skin, fish, tofu, beans, and nuts. ? Eating low-fat dairy products. ? Avoiding foods that are high in salt (sodium). This can help lower blood pressure. ? Avoiding foods that have saturated fat, trans fat, and cholesterol. This can help prevent high cholesterol. ? Avoiding processed and premade foods.  Follow your health care provider's specific guidelines for losing weight, controlling high blood pressure (hypertension), lowering high cholesterol, and managing diabetes. These may include: ? Reducing your daily calorie intake. ? Limiting your daily sodium intake to 1,500 milligrams (mg). ? Using only healthy fats for cooking, such as olive oil, canola oil, or sunflower oil. ? Counting your daily carbohydrate intake. What lifestyle changes can be made?   Maintain a healthy weight. Talk to your health care provider about your ideal weight.  Get at least 30 minutes of moderate physical activity at least 5 days a week. Moderate activity includes brisk walking, biking, and swimming.  Do not use any products that contain nicotine or tobacco, such as cigarettes and e-cigarettes. If you need help quitting, ask your health care provider. It may also be helpful to avoid exposure to secondhand smoke.  Limit alcohol intake to no more than 1 drink a day for nonpregnant women and 2 drinks a day for men. One drink equals 12 oz of beer, 5 oz of wine, or 1 oz of hard liquor.  Stop any illegal drug use.  Avoid taking birth control pills. Talk to your health care provider about the risks of taking birth control pills if: ? You are over 17 years old. ? You smoke. ? You get migraines. ? You have ever had a blood clot. What other changes can be made?  Manage your cholesterol levels. ? Eating a healthy diet is important for preventing high cholesterol. If cholesterol cannot be managed through diet alone, you may also need to take medicines. ? Take any prescribed medicines to control your cholesterol as told by your health care provider.  Manage your diabetes. ? Eating a healthy diet and exercising regularly are important parts of managing your blood sugar. If your blood sugar cannot be managed through diet and exercise, you may need to take medicines. ? Take any prescribed medicines to control your diabetes as told by your health care provider.  Control your hypertension. ? To reduce your risk of stroke, try to keep your blood pressure below 130/80. ? Eating a healthy diet and exercising regularly are an important part of controlling your blood pressure. If your blood pressure cannot be managed through diet and exercise, you may need to take medicines. ? Take any prescribed medicines to control hypertension as told by your health care provider. ? Ask your  health care provider if you should monitor your blood pressure at home. ? Have your blood pressure checked every year, even if your blood pressure is normal. Blood pressure increases with age and some medical conditions.  Get evaluated for sleep disorders (  sleep apnea). Talk to your health care provider about getting a sleep evaluation if you snore a lot or have excessive sleepiness.  Take over-the-counter and prescription medicines only as told by your health care provider. Aspirin or blood thinners (antiplatelets or anticoagulants) may be recommended to reduce your risk of forming blood clots that can lead to stroke.  Make sure that any other medical conditions you have, such as atrial fibrillation or atherosclerosis, are managed. What are the warning signs of a stroke? The warning signs of a stroke can be easily remembered as BEFAST.  B is for balance. Signs include: ? Dizziness. ? Loss of balance or coordination. ? Sudden trouble walking.  E is for eyes. Signs include: ? A sudden change in vision. ? Trouble seeing.  F is for face. Signs include: ? Sudden weakness or numbness of the face. ? The face or eyelid drooping to one side.  A is for arms. Signs include: ? Sudden weakness or numbness of the arm, usually on one side of the body.  S is for speech. Signs include: ? Trouble speaking (aphasia). ? Trouble understanding.  T is for time. ? These symptoms may represent a serious problem that is an emergency. Do not wait to see if the symptoms will go away. Get medical help right away. Call your local emergency services (911 in the [place] Do not drive yourself to the hospital.  Other signs of stroke may include: ? A sudden, severe headache with no known cause. ? Nausea or vomiting. ? Seizure. Where to find more information For more information, visit:  American Stroke Association: www.strokeassociation.org  National Stroke Association: www.stroke.org Summary  You can  prevent a stroke by eating healthy, exercising, not smoking, limiting alcohol intake, and managing any medical conditions you may have.  Do not use any products that contain nicotine or tobacco, such as cigarettes and e-cigarettes. If you need help quitting, ask your health care provider. It may also be helpful to avoid exposure to secondhand smoke.  Remember BEFAST for warning signs of stroke. Get help right away if you or a loved one has any of these signs. This information is not intended to replace advice given to you by your health care provider. Make sure you discuss any questions you have with your health care provider. Document Released: 07/17/2004 Document Revised: 05/22/2017 Document Reviewed: 07/15/2016 Elsevier Patient Education  2020 [employer].

## 2019-01-05 DIAGNOSIS — E785 Hyperlipidemia, unspecified: Secondary | ICD-10-CM | POA: Diagnosis not present

## 2019-01-05 DIAGNOSIS — I69354 Hemiplegia and hemiparesis following cerebral infarction affecting left non-dominant side: Secondary | ICD-10-CM | POA: Diagnosis not present

## 2019-01-05 DIAGNOSIS — D649 Anemia, unspecified: Secondary | ICD-10-CM | POA: Diagnosis not present

## 2019-01-05 DIAGNOSIS — I10 Essential (primary) hypertension: Secondary | ICD-10-CM | POA: Diagnosis not present

## 2019-01-05 DIAGNOSIS — J449 Chronic obstructive pulmonary disease, unspecified: Secondary | ICD-10-CM | POA: Diagnosis not present

## 2019-01-05 DIAGNOSIS — E039 Hypothyroidism, unspecified: Secondary | ICD-10-CM | POA: Diagnosis not present

## 2019-01-06 DIAGNOSIS — K7460 Unspecified cirrhosis of liver: Secondary | ICD-10-CM | POA: Diagnosis not present

## 2019-01-06 DIAGNOSIS — N390 Urinary tract infection, site not specified: Secondary | ICD-10-CM | POA: Diagnosis not present

## 2019-01-06 DIAGNOSIS — F339 Major depressive disorder, recurrent, unspecified: Secondary | ICD-10-CM | POA: Diagnosis not present

## 2019-01-06 DIAGNOSIS — D509 Iron deficiency anemia, unspecified: Secondary | ICD-10-CM | POA: Diagnosis not present

## 2019-01-06 DIAGNOSIS — Z139 Encounter for screening, unspecified: Secondary | ICD-10-CM | POA: Diagnosis not present

## 2019-01-07 DIAGNOSIS — D509 Iron deficiency anemia, unspecified: Secondary | ICD-10-CM | POA: Diagnosis not present

## 2019-01-10 DIAGNOSIS — D509 Iron deficiency anemia, unspecified: Secondary | ICD-10-CM | POA: Diagnosis not present

## 2019-01-12 DIAGNOSIS — I69354 Hemiplegia and hemiparesis following cerebral infarction affecting left non-dominant side: Secondary | ICD-10-CM | POA: Diagnosis not present

## 2019-01-12 DIAGNOSIS — E039 Hypothyroidism, unspecified: Secondary | ICD-10-CM | POA: Diagnosis not present

## 2019-01-12 DIAGNOSIS — J449 Chronic obstructive pulmonary disease, unspecified: Secondary | ICD-10-CM | POA: Diagnosis not present

## 2019-01-12 DIAGNOSIS — D649 Anemia, unspecified: Secondary | ICD-10-CM | POA: Diagnosis not present

## 2019-01-12 DIAGNOSIS — E785 Hyperlipidemia, unspecified: Secondary | ICD-10-CM | POA: Diagnosis not present

## 2019-01-12 DIAGNOSIS — I10 Essential (primary) hypertension: Secondary | ICD-10-CM | POA: Diagnosis not present

## 2019-01-13 ENCOUNTER — Telehealth: Payer: Self-pay | Admitting: Physical Medicine & Rehabilitation

## 2019-01-13 DIAGNOSIS — D649 Anemia, unspecified: Secondary | ICD-10-CM | POA: Diagnosis not present

## 2019-01-13 DIAGNOSIS — I69354 Hemiplegia and hemiparesis following cerebral infarction affecting left non-dominant side: Secondary | ICD-10-CM | POA: Diagnosis not present

## 2019-01-13 DIAGNOSIS — J449 Chronic obstructive pulmonary disease, unspecified: Secondary | ICD-10-CM | POA: Diagnosis not present

## 2019-01-13 DIAGNOSIS — E785 Hyperlipidemia, unspecified: Secondary | ICD-10-CM | POA: Diagnosis not present

## 2019-01-13 DIAGNOSIS — E039 Hypothyroidism, unspecified: Secondary | ICD-10-CM | POA: Diagnosis not present

## 2019-01-13 DIAGNOSIS — I10 Essential (primary) hypertension: Secondary | ICD-10-CM | POA: Diagnosis not present

## 2019-01-13 NOTE — Telephone Encounter (Signed)
Mark Bias home health PT needs to get verbal orders to extend patient to 2w2 1w1 starting 01/16/19.  Please call him on his confidential voicemail at [phone].

## 2019-01-14 DIAGNOSIS — I69354 Hemiplegia and hemiparesis following cerebral infarction affecting left non-dominant side: Secondary | ICD-10-CM | POA: Diagnosis not present

## 2019-01-14 DIAGNOSIS — E039 Hypothyroidism, unspecified: Secondary | ICD-10-CM | POA: Diagnosis not present

## 2019-01-14 DIAGNOSIS — I10 Essential (primary) hypertension: Secondary | ICD-10-CM | POA: Diagnosis not present

## 2019-01-14 DIAGNOSIS — J449 Chronic obstructive pulmonary disease, unspecified: Secondary | ICD-10-CM | POA: Diagnosis not present

## 2019-01-14 DIAGNOSIS — E785 Hyperlipidemia, unspecified: Secondary | ICD-10-CM | POA: Diagnosis not present

## 2019-01-14 DIAGNOSIS — D649 Anemia, unspecified: Secondary | ICD-10-CM | POA: Diagnosis not present

## 2019-01-14 NOTE — Telephone Encounter (Signed)
Approval given

## 2019-01-17 DIAGNOSIS — Z139 Encounter for screening, unspecified: Secondary | ICD-10-CM | POA: Diagnosis not present

## 2019-01-17 DIAGNOSIS — J449 Chronic obstructive pulmonary disease, unspecified: Secondary | ICD-10-CM | POA: Diagnosis not present

## 2019-01-17 DIAGNOSIS — Z0000 Encounter for general adult medical examination without abnormal findings: Secondary | ICD-10-CM | POA: Diagnosis not present

## 2019-01-17 DIAGNOSIS — D509 Iron deficiency anemia, unspecified: Secondary | ICD-10-CM | POA: Diagnosis not present

## 2019-01-17 DIAGNOSIS — Z1331 Encounter for screening for depression: Secondary | ICD-10-CM | POA: Diagnosis not present

## 2019-01-17 DIAGNOSIS — I69359 Hemiplegia and hemiparesis following cerebral infarction affecting unspecified side: Secondary | ICD-10-CM | POA: Diagnosis not present

## 2019-01-18 DIAGNOSIS — D649 Anemia, unspecified: Secondary | ICD-10-CM | POA: Diagnosis not present

## 2019-01-18 DIAGNOSIS — I69354 Hemiplegia and hemiparesis following cerebral infarction affecting left non-dominant side: Secondary | ICD-10-CM | POA: Diagnosis not present

## 2019-01-18 DIAGNOSIS — I10 Essential (primary) hypertension: Secondary | ICD-10-CM | POA: Diagnosis not present

## 2019-01-18 DIAGNOSIS — J449 Chronic obstructive pulmonary disease, unspecified: Secondary | ICD-10-CM | POA: Diagnosis not present

## 2019-01-18 DIAGNOSIS — E785 Hyperlipidemia, unspecified: Secondary | ICD-10-CM | POA: Diagnosis not present

## 2019-01-18 DIAGNOSIS — E039 Hypothyroidism, unspecified: Secondary | ICD-10-CM | POA: Diagnosis not present

## 2019-01-19 DIAGNOSIS — I10 Essential (primary) hypertension: Secondary | ICD-10-CM | POA: Diagnosis not present

## 2019-01-19 DIAGNOSIS — I69354 Hemiplegia and hemiparesis following cerebral infarction affecting left non-dominant side: Secondary | ICD-10-CM | POA: Diagnosis not present

## 2019-01-19 DIAGNOSIS — E039 Hypothyroidism, unspecified: Secondary | ICD-10-CM | POA: Diagnosis not present

## 2019-01-19 DIAGNOSIS — J449 Chronic obstructive pulmonary disease, unspecified: Secondary | ICD-10-CM | POA: Diagnosis not present

## 2019-01-19 DIAGNOSIS — E785 Hyperlipidemia, unspecified: Secondary | ICD-10-CM | POA: Diagnosis not present

## 2019-01-19 DIAGNOSIS — D649 Anemia, unspecified: Secondary | ICD-10-CM | POA: Diagnosis not present

## 2019-01-20 DIAGNOSIS — E785 Hyperlipidemia, unspecified: Secondary | ICD-10-CM | POA: Diagnosis not present

## 2019-01-20 DIAGNOSIS — I10 Essential (primary) hypertension: Secondary | ICD-10-CM | POA: Diagnosis not present

## 2019-01-20 DIAGNOSIS — E039 Hypothyroidism, unspecified: Secondary | ICD-10-CM | POA: Diagnosis not present

## 2019-01-20 DIAGNOSIS — J449 Chronic obstructive pulmonary disease, unspecified: Secondary | ICD-10-CM | POA: Diagnosis not present

## 2019-01-20 DIAGNOSIS — I69354 Hemiplegia and hemiparesis following cerebral infarction affecting left non-dominant side: Secondary | ICD-10-CM | POA: Diagnosis not present

## 2019-01-20 DIAGNOSIS — D649 Anemia, unspecified: Secondary | ICD-10-CM | POA: Diagnosis not present

## 2019-01-21 ENCOUNTER — Encounter: Payer: Medicare Other | Admitting: *Deleted

## 2019-01-21 DIAGNOSIS — E785 Hyperlipidemia, unspecified: Secondary | ICD-10-CM | POA: Diagnosis not present

## 2019-01-21 DIAGNOSIS — J449 Chronic obstructive pulmonary disease, unspecified: Secondary | ICD-10-CM | POA: Diagnosis not present

## 2019-01-21 DIAGNOSIS — I129 Hypertensive chronic kidney disease with stage 1 through stage 4 chronic kidney disease, or unspecified chronic kidney disease: Secondary | ICD-10-CM | POA: Diagnosis not present

## 2019-01-21 DIAGNOSIS — I69359 Hemiplegia and hemiparesis following cerebral infarction affecting unspecified side: Secondary | ICD-10-CM | POA: Diagnosis not present

## 2019-01-24 ENCOUNTER — Other Ambulatory Visit: Payer: Self-pay

## 2019-01-24 ENCOUNTER — Telehealth: Payer: Self-pay | Admitting: Cardiology

## 2019-01-24 ENCOUNTER — Encounter: Payer: Medicare Other | Attending: Physical Medicine & Rehabilitation | Admitting: Physical Medicine & Rehabilitation

## 2019-01-24 ENCOUNTER — Encounter: Payer: Self-pay | Admitting: Physical Medicine & Rehabilitation

## 2019-01-24 VITALS — BP 135/83 | HR 87 | Temp 98.6°F | Ht 62.0 in | Wt 123.6 lb

## 2019-01-24 DIAGNOSIS — R269 Unspecified abnormalities of gait and mobility: Secondary | ICD-10-CM | POA: Insufficient documentation

## 2019-01-24 DIAGNOSIS — R262 Difficulty in walking, not elsewhere classified: Secondary | ICD-10-CM | POA: Diagnosis not present

## 2019-01-24 DIAGNOSIS — D649 Anemia, unspecified: Secondary | ICD-10-CM | POA: Insufficient documentation

## 2019-01-24 DIAGNOSIS — E039 Hypothyroidism, unspecified: Secondary | ICD-10-CM | POA: Insufficient documentation

## 2019-01-24 DIAGNOSIS — I10 Essential (primary) hypertension: Secondary | ICD-10-CM | POA: Diagnosis not present

## 2019-01-24 DIAGNOSIS — D508 Other iron deficiency anemias: Secondary | ICD-10-CM

## 2019-01-24 DIAGNOSIS — M25552 Pain in left hip: Secondary | ICD-10-CM | POA: Insufficient documentation

## 2019-01-24 DIAGNOSIS — J449 Chronic obstructive pulmonary disease, unspecified: Secondary | ICD-10-CM | POA: Diagnosis not present

## 2019-01-24 DIAGNOSIS — R251 Tremor, unspecified: Secondary | ICD-10-CM | POA: Diagnosis not present

## 2019-01-24 DIAGNOSIS — Z87891 Personal history of nicotine dependence: Secondary | ICD-10-CM | POA: Diagnosis not present

## 2019-01-24 DIAGNOSIS — R531 Weakness: Secondary | ICD-10-CM | POA: Insufficient documentation

## 2019-01-24 DIAGNOSIS — I639 Cerebral infarction, unspecified: Secondary | ICD-10-CM

## 2019-01-24 DIAGNOSIS — I69398 Other sequelae of cerebral infarction: Secondary | ICD-10-CM | POA: Insufficient documentation

## 2019-01-24 DIAGNOSIS — K7581 Nonalcoholic steatohepatitis (NASH): Secondary | ICD-10-CM | POA: Diagnosis not present

## 2019-01-24 DIAGNOSIS — E785 Hyperlipidemia, unspecified: Secondary | ICD-10-CM | POA: Diagnosis not present

## 2019-01-24 DIAGNOSIS — I69354 Hemiplegia and hemiparesis following cerebral infarction affecting left non-dominant side: Secondary | ICD-10-CM | POA: Diagnosis not present

## 2019-01-24 HISTORY — DX: Pain in left hip: M25.552

## 2019-01-24 MED ORDER — DICLOFENAC SODIUM 1 % TD GEL: 2.0000 g | Freq: Four times a day (QID) | TRANSDERMAL | 0 refills | Status: DC

## 2019-01-24 NOTE — Telephone Encounter (Signed)
Spoke w/ pt and instructed her how to send a manual transmission w/ her home monitor. Transmission received.

## 2019-01-24 NOTE — Telephone Encounter (Signed)
LMOVM requesting that pt send a manual transmission w/ her home monitor.

## 2019-01-24 NOTE — Progress Notes (Addendum)
Subjective:    Patient ID: Stephanie Love, female    DOB: [birth year], 83 y.o.   MRN: [id number]  HPI Female with history of COPD, NASH, anemia presents for follow up for bilateral infarcts and brain bleeds.  Daughter supplements history.  Last clinic visit on 12/13/2018.  Since that time, she states she had a fall on uneven ground in her yard, falling on her left hip, denies head trauma.  She is still in therapies. She saw Cards and Neuro. BP is controlled. She is following up with PCP regarding anemia.  Using rollator at all times.   Pain Inventory Average Pain 0 Pain Right Now 8 My pain is aching  In the last 24 hours, has pain interfered with the following? General activity 5 Relation with others 0 Enjoyment of life 5 What TIME of day is your pain at its worst? all Sleep (in general) Fair  Pain is worse with: walking and bending Pain improves with: rest Relief from Meds: 2  Mobility walk without assistance use a walker how many minutes can you walk? 15 ability to climb steps?  yes do you drive?  no  Function retired  Neuro/Psych tremor trouble walking depression  Prior Studies Any changes since last visit?  yes CT/MRI  Physicians involved in your care Primary care Dr. Marco Collie   Family History  Problem Relation Age of Onset  . Cancer Mother   . Diabetes Father   . Heart disease Father    Social History   Socioeconomic History  . Marital status: Widowed    Spouse name: Not on file  . Number of children: Not on file  . Years of education: Not on file  . Highest education level: Not on file  Occupational History  . Not on file  Social Needs  . Financial resource strain: Not very hard  . Food insecurity    Worry: Never true    Inability: Never true  . Transportation needs    Medical: No    Non-medical: No  Tobacco Use  . Smoking status: Former Smoker    Types: Cigarettes    Quit date: 11/16/1981    Years since quitting: 37.2  . Smokeless  tobacco: Never Used  Substance and Sexual Activity  . Alcohol use: Not Currently  . Drug use: Never  . Sexual activity: Not Currently  Lifestyle  . Physical activity    Days per week: 0 days    Minutes per session: 0 min  . Stress: Not at all  Relationships  . Social Herbalist on phone: Twice a week    Gets together: Twice a week    Attends religious service: 1 to 4 times per year    Active member of club or organization: Yes    Attends meetings of clubs or organizations: 1 to 4 times per year    Relationship status: Widowed  Other Topics Concern  . Not on file  Social History Narrative  . Not on file   Past Surgical History:  Procedure Laterality Date  . ABDOMINAL HYSTERECTOMY  2012  . AMPUTATION TOE     right 2nd toe   . CHOLECYSTECTOMY  2013  . LOOP RECORDER INSERTION N/A 11/16/2018   Procedure: LOOP RECORDER INSERTION;  Surgeon: Thompson Grayer, MD;  Location: [facility];  Service: Cardiovascular;  Laterality: N/A;   Past Medical History:  Diagnosis Date  . Anemia   . Arthritis   . COPD (chronic obstructive  pulmonary disease) ([place])   . Diverticulosis   . GERD (gastroesophageal reflux disease)   . Hepatitis A virus infection 1963  . Hypertension   . Hypothyroid   . Microhematuria   . NASH (nonalcoholic steatohepatitis)   . Osteoarthritis   . Stroke ([place]) 11/13/2018  . UTI (urinary tract infection)    BP 135/83   Pulse 87   Temp 98.6 F (37 C)   Ht 5\' 2"  (1.575 m)   Wt 123 lb 9.6 oz (56.1 kg)   SpO2 94%   BMI 22.61 kg/m   Opioid Risk Score:   Fall Risk Score:  `1  Depression screen PHQ 2/9  Depression screen [facility] 2/9 01/24/2019 08/10/2018  Decreased Interest 0 0  Down, Depressed, Hopeless 0 1  PHQ - 2 Score 0 1  Altered sleeping 2 -  Tired, decreased energy 1 -  Change in appetite 2 -  Feeling bad or failure about yourself  2 -  Trouble concentrating 1 -  Moving slowly or fidgety/restless 0 -  Suicidal thoughts 0 -  PHQ-9 Score 8  -  Difficult doing work/chores Somewhat difficult -   Review of Systems  Constitutional: Negative.   HENT: Negative.   Eyes: Negative.   Respiratory: Negative.   Cardiovascular: Negative.   Gastrointestinal: Negative.   Endocrine: Negative.   Genitourinary: Negative.   Musculoskeletal: Positive for gait problem.  Skin: Negative.   Allergic/Immunologic: Negative.   Neurological: Positive for dizziness, tremors and weakness.  Hematological: Negative.   Psychiatric/Behavioral: Negative.   All other systems reviewed and are negative.     Objective:   Physical Exam Constitutional: No distress . Vital signs reviewed. HENT:  Normocephalic. Atraumatic. Eyes:  EOMI. No discharge. Cardiovascular: No JVD. Respiratory:  Normal effort. GI: Non-distended. Musc:  Mild pain in internal rotation of LLE at the hip  No TTP LLE  Gait: Antalgic with rollator Neurological:  Alert  Motor: Right upper extremity/right lower extremity: 5/5 proximal distal Left upper extremity: 4+-5 proximal to distal Left lower extremity: HF 2/5 (pain inhibition), KE 4/5 (pain inhibition), 5/5 ADF HOH Skin: Skin is warm and dry.  Psychiatric: She has a normal mood and affect. Her behavior is normal.     Assessment & Plan:  Female with history of COPD, NASH, anemia presents for transitional care management after receiving CIR for bilateral infarcts and brain bleeds.  1. Weakness, balance deficits with posterior lean secondary to bilateral infarcts as well as right brain bleeds.             Continue therapies  Cont follow up with Neuro   2.H/o anemia:   Cont follow up with PCP, recent transfusion and iron infusion  3. Gait abnormality   Cont therapies  Cont rollator for safety, transition to cane after left hip improvement  4. Left hip pain  After recent fall  Discussed xrays, however, patient notes pain is improving and she would like to hold off and contact her PCP if the pain become worse  Will order  Voltaren gel  5. Pain  Controlled with tylenol  RTC 6 weeks to follow up on hip pain independently and inconjunction with therapies - patient and daughter in agreement

## 2019-01-25 DIAGNOSIS — E785 Hyperlipidemia, unspecified: Secondary | ICD-10-CM | POA: Diagnosis not present

## 2019-01-25 DIAGNOSIS — J449 Chronic obstructive pulmonary disease, unspecified: Secondary | ICD-10-CM | POA: Diagnosis not present

## 2019-01-25 DIAGNOSIS — D649 Anemia, unspecified: Secondary | ICD-10-CM | POA: Diagnosis not present

## 2019-01-25 DIAGNOSIS — I69354 Hemiplegia and hemiparesis following cerebral infarction affecting left non-dominant side: Secondary | ICD-10-CM | POA: Diagnosis not present

## 2019-01-25 DIAGNOSIS — I10 Essential (primary) hypertension: Secondary | ICD-10-CM | POA: Diagnosis not present

## 2019-01-25 DIAGNOSIS — E039 Hypothyroidism, unspecified: Secondary | ICD-10-CM | POA: Diagnosis not present

## 2019-01-25 NOTE — Telephone Encounter (Signed)
Transmission reviewed. ECGs suggest SR w/ ectopy and some TWOS.

## 2019-01-28 DIAGNOSIS — D649 Anemia, unspecified: Secondary | ICD-10-CM | POA: Diagnosis not present

## 2019-01-28 DIAGNOSIS — R262 Difficulty in walking, not elsewhere classified: Secondary | ICD-10-CM | POA: Diagnosis not present

## 2019-01-28 DIAGNOSIS — S72002D Fracture of unspecified part of neck of left femur, subsequent encounter for closed fracture with routine healing: Secondary | ICD-10-CM | POA: Diagnosis not present

## 2019-01-28 DIAGNOSIS — Z741 Need for assistance with personal care: Secondary | ICD-10-CM | POA: Diagnosis not present

## 2019-01-28 DIAGNOSIS — Z96642 Presence of left artificial hip joint: Secondary | ICD-10-CM | POA: Diagnosis not present

## 2019-01-28 DIAGNOSIS — R279 Unspecified lack of coordination: Secondary | ICD-10-CM | POA: Diagnosis not present

## 2019-01-28 DIAGNOSIS — R102 Pelvic and perineal pain: Secondary | ICD-10-CM | POA: Diagnosis not present

## 2019-01-28 DIAGNOSIS — Z7401 Bed confinement status: Secondary | ICD-10-CM | POA: Diagnosis not present

## 2019-01-28 DIAGNOSIS — M84452A Pathological fracture, left femur, initial encounter for fracture: Secondary | ICD-10-CM | POA: Diagnosis not present

## 2019-01-28 DIAGNOSIS — S72002A Fracture of unspecified part of neck of left femur, initial encounter for closed fracture: Secondary | ICD-10-CM | POA: Diagnosis not present

## 2019-01-28 DIAGNOSIS — Z8673 Personal history of transient ischemic attack (TIA), and cerebral infarction without residual deficits: Secondary | ICD-10-CM | POA: Diagnosis not present

## 2019-01-28 DIAGNOSIS — M1990 Unspecified osteoarthritis, unspecified site: Secondary | ICD-10-CM | POA: Diagnosis present

## 2019-01-28 DIAGNOSIS — I700 Atherosclerosis of aorta: Secondary | ICD-10-CM | POA: Diagnosis not present

## 2019-01-28 DIAGNOSIS — M6281 Muscle weakness (generalized): Secondary | ICD-10-CM | POA: Diagnosis not present

## 2019-01-28 DIAGNOSIS — M1612 Unilateral primary osteoarthritis, left hip: Secondary | ICD-10-CM | POA: Diagnosis not present

## 2019-01-28 DIAGNOSIS — I10 Essential (primary) hypertension: Secondary | ICD-10-CM | POA: Diagnosis not present

## 2019-01-28 DIAGNOSIS — R5381 Other malaise: Secondary | ICD-10-CM | POA: Diagnosis not present

## 2019-01-28 DIAGNOSIS — D62 Acute posthemorrhagic anemia: Secondary | ICD-10-CM | POA: Diagnosis not present

## 2019-01-28 DIAGNOSIS — Z20828 Contact with and (suspected) exposure to other viral communicable diseases: Secondary | ICD-10-CM | POA: Diagnosis not present

## 2019-01-28 DIAGNOSIS — S7002XS Contusion of left hip, sequela: Secondary | ICD-10-CM | POA: Diagnosis not present

## 2019-01-28 DIAGNOSIS — Z7982 Long term (current) use of aspirin: Secondary | ICD-10-CM | POA: Diagnosis not present

## 2019-01-28 DIAGNOSIS — Z66 Do not resuscitate: Secondary | ICD-10-CM | POA: Diagnosis not present

## 2019-01-28 DIAGNOSIS — F330 Major depressive disorder, recurrent, mild: Secondary | ICD-10-CM | POA: Diagnosis not present

## 2019-01-28 DIAGNOSIS — K219 Gastro-esophageal reflux disease without esophagitis: Secondary | ICD-10-CM | POA: Diagnosis not present

## 2019-01-28 DIAGNOSIS — J439 Emphysema, unspecified: Secondary | ICD-10-CM | POA: Diagnosis not present

## 2019-01-28 DIAGNOSIS — I69354 Hemiplegia and hemiparesis following cerebral infarction affecting left non-dominant side: Secondary | ICD-10-CM | POA: Diagnosis not present

## 2019-01-28 DIAGNOSIS — D509 Iron deficiency anemia, unspecified: Secondary | ICD-10-CM | POA: Diagnosis not present

## 2019-01-28 DIAGNOSIS — S728X2A Other fracture of left femur, initial encounter for closed fracture: Secondary | ICD-10-CM | POA: Diagnosis not present

## 2019-01-28 DIAGNOSIS — E039 Hypothyroidism, unspecified: Secondary | ICD-10-CM | POA: Diagnosis not present

## 2019-01-28 DIAGNOSIS — Z87891 Personal history of nicotine dependence: Secondary | ICD-10-CM | POA: Diagnosis not present

## 2019-01-28 DIAGNOSIS — R269 Unspecified abnormalities of gait and mobility: Secondary | ICD-10-CM | POA: Diagnosis not present

## 2019-01-28 DIAGNOSIS — M2550 Pain in unspecified joint: Secondary | ICD-10-CM | POA: Diagnosis not present

## 2019-01-28 DIAGNOSIS — S72012A Unspecified intracapsular fracture of left femur, initial encounter for closed fracture: Secondary | ICD-10-CM | POA: Diagnosis not present

## 2019-01-28 DIAGNOSIS — Z79891 Long term (current) use of opiate analgesic: Secondary | ICD-10-CM | POA: Diagnosis not present

## 2019-01-28 DIAGNOSIS — R600 Localized edema: Secondary | ICD-10-CM | POA: Diagnosis not present

## 2019-01-28 DIAGNOSIS — J449 Chronic obstructive pulmonary disease, unspecified: Secondary | ICD-10-CM | POA: Diagnosis present

## 2019-01-28 DIAGNOSIS — M25552 Pain in left hip: Secondary | ICD-10-CM | POA: Diagnosis not present

## 2019-01-28 DIAGNOSIS — Z95818 Presence of other cardiac implants and grafts: Secondary | ICD-10-CM | POA: Diagnosis not present

## 2019-02-02 DIAGNOSIS — R279 Unspecified lack of coordination: Secondary | ICD-10-CM | POA: Diagnosis not present

## 2019-02-02 DIAGNOSIS — R600 Localized edema: Secondary | ICD-10-CM | POA: Diagnosis not present

## 2019-02-02 DIAGNOSIS — J449 Chronic obstructive pulmonary disease, unspecified: Secondary | ICD-10-CM | POA: Diagnosis not present

## 2019-02-02 DIAGNOSIS — S72002D Fracture of unspecified part of neck of left femur, subsequent encounter for closed fracture with routine healing: Secondary | ICD-10-CM | POA: Diagnosis not present

## 2019-02-02 DIAGNOSIS — R262 Difficulty in walking, not elsewhere classified: Secondary | ICD-10-CM | POA: Diagnosis not present

## 2019-02-02 DIAGNOSIS — E039 Hypothyroidism, unspecified: Secondary | ICD-10-CM | POA: Diagnosis not present

## 2019-02-02 DIAGNOSIS — R5381 Other malaise: Secondary | ICD-10-CM | POA: Diagnosis not present

## 2019-02-02 DIAGNOSIS — M6281 Muscle weakness (generalized): Secondary | ICD-10-CM | POA: Diagnosis not present

## 2019-02-02 DIAGNOSIS — M2550 Pain in unspecified joint: Secondary | ICD-10-CM | POA: Diagnosis not present

## 2019-02-02 DIAGNOSIS — Z741 Need for assistance with personal care: Secondary | ICD-10-CM | POA: Diagnosis not present

## 2019-02-02 DIAGNOSIS — Z7401 Bed confinement status: Secondary | ICD-10-CM | POA: Diagnosis not present

## 2019-02-02 DIAGNOSIS — I10 Essential (primary) hypertension: Secondary | ICD-10-CM | POA: Diagnosis not present

## 2019-02-02 DIAGNOSIS — Z96642 Presence of left artificial hip joint: Secondary | ICD-10-CM | POA: Diagnosis not present

## 2019-02-02 DIAGNOSIS — K219 Gastro-esophageal reflux disease without esophagitis: Secondary | ICD-10-CM | POA: Diagnosis not present

## 2019-02-02 DIAGNOSIS — D649 Anemia, unspecified: Secondary | ICD-10-CM | POA: Diagnosis not present

## 2019-02-02 DIAGNOSIS — F330 Major depressive disorder, recurrent, mild: Secondary | ICD-10-CM | POA: Diagnosis not present

## 2019-02-02 DIAGNOSIS — S72002A Fracture of unspecified part of neck of left femur, initial encounter for closed fracture: Secondary | ICD-10-CM | POA: Diagnosis not present

## 2019-02-02 DIAGNOSIS — Z8673 Personal history of transient ischemic attack (TIA), and cerebral infarction without residual deficits: Secondary | ICD-10-CM | POA: Diagnosis not present

## 2019-02-02 DIAGNOSIS — R269 Unspecified abnormalities of gait and mobility: Secondary | ICD-10-CM | POA: Diagnosis not present

## 2019-02-11 ENCOUNTER — Other Ambulatory Visit: Payer: Self-pay

## 2019-02-11 NOTE — Patient Outreach (Signed)
First attempt to obtain mRs. No answer. Left message for return call.  

## 2019-02-15 ENCOUNTER — Other Ambulatory Visit: Payer: Self-pay

## 2019-02-15 NOTE — Patient Outreach (Signed)
Second attempt to obtain mRs. No answer. Left message for return call.  

## 2019-02-21 DIAGNOSIS — E785 Hyperlipidemia, unspecified: Secondary | ICD-10-CM | POA: Diagnosis not present

## 2019-02-21 DIAGNOSIS — E860 Dehydration: Secondary | ICD-10-CM | POA: Diagnosis not present

## 2019-02-21 DIAGNOSIS — I129 Hypertensive chronic kidney disease with stage 1 through stage 4 chronic kidney disease, or unspecified chronic kidney disease: Secondary | ICD-10-CM | POA: Diagnosis not present

## 2019-02-21 DIAGNOSIS — N390 Urinary tract infection, site not specified: Secondary | ICD-10-CM | POA: Diagnosis not present

## 2019-02-21 DIAGNOSIS — J449 Chronic obstructive pulmonary disease, unspecified: Secondary | ICD-10-CM | POA: Diagnosis not present

## 2019-02-21 DIAGNOSIS — S72143A Displaced intertrochanteric fracture of unspecified femur, initial encounter for closed fracture: Secondary | ICD-10-CM | POA: Diagnosis not present

## 2019-02-21 DIAGNOSIS — Z6821 Body mass index (BMI) 21.0-21.9, adult: Secondary | ICD-10-CM | POA: Diagnosis not present

## 2019-02-21 DIAGNOSIS — I69359 Hemiplegia and hemiparesis following cerebral infarction affecting unspecified side: Secondary | ICD-10-CM | POA: Diagnosis not present

## 2019-02-22 ENCOUNTER — Other Ambulatory Visit: Payer: Self-pay

## 2019-02-22 NOTE — Patient Outreach (Signed)
3 outreach attempts were completed to obtain mRs. mRs could not be obtained because patient never returned my calls. mRs=7 

## 2019-02-23 ENCOUNTER — Ambulatory Visit (INDEPENDENT_AMBULATORY_CARE_PROVIDER_SITE_OTHER): Payer: Medicare Other | Admitting: *Deleted

## 2019-02-23 DIAGNOSIS — I63119 Cerebral infarction due to embolism of unspecified vertebral artery: Secondary | ICD-10-CM | POA: Diagnosis not present

## 2019-02-24 LAB — CUP PACEART REMOTE DEVICE CHECK
Date Time Interrogation Session: 20200902180925
Implantable Pulse Generator Implant Date: 20200526

## 2019-03-07 ENCOUNTER — Ambulatory Visit: Payer: Medicare Other | Admitting: Physical Medicine & Rehabilitation

## 2019-03-09 ENCOUNTER — Other Ambulatory Visit: Payer: Self-pay

## 2019-03-09 NOTE — Patient Outreach (Signed)
[facility] consult/screening: Referral source: MD office Reason: medication assistance  Placed call to patient who answered and identified herself. Reports she is doing well. Reports recent stoke with left her leg weak. Uses a walker.  Reports she fell 6 weeks ago and fractured her hip. Reports a partial hip replacement.  States that she can not afford her Dailresp or sprivia. Reports cost is 137.00 for each.  Reports she is managing okay with her other medications.    Denies any other needs at this time.  PLAN: will order for pharmacy to assist patient with medication cost and resources. Will plan to close to nursing as no needs identified.  Tomasa Rand, RN, BSN, CEN [facility] [employer] [phone]

## 2019-03-10 ENCOUNTER — Other Ambulatory Visit: Payer: Self-pay | Admitting: Pharmacist

## 2019-03-10 NOTE — Patient Outreach (Addendum)
[place] [facility]) Care Management  [place]   03/10/2019  Stephanie Love [birth year] [id number]  Reason for referral: Medication Assistance with Daliresp + Spiriva   **Note patient was referred to [place] in February 2020 for medication assistance with Spiriva and Daliresp.  Applications mailed to patient at that time however patient declined further interest in applying. **  Referral source: Dr. 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  HIPAA identifiers verified.   Subjective:  Patient reports she remembers discussing patient assistance program with [facility] in Spring 2020 but thought she did not qualify which is why she decided to stop paperwork. Patient uses [employer] and states she spends at least $200 / month on medications.  She reports she recently had a stroke in May and is still recovering her strength in her left side.   Objective: The ASCVD Risk score Mikey Bussing DC Jr., et al., 2013) failed to calculate for the following reasons:   The 2013 ASCVD risk score is only valid for ages 49 to 61   The patient has a prior MI or stroke diagnosis  Lab Results  Component Value Date   CREATININE 0.90 11/24/2018   CREATININE 0.88 11/17/2018    Lab Results  Component Value Date   HGBA1C 5.9 (H) 11/14/2018    Lipid Panel     Component Value Date/Time   CHOL 124 11/14/2018 0434   TRIG 40 11/14/2018 0434   HDL 51 11/14/2018 0434   CHOLHDL 2.4 11/14/2018 0434   VLDL 8 11/14/2018 0434   LDLCALC 65 11/14/2018 0434    BP Readings from Last 3 Encounters:  01/24/19 135/83  01/04/19 124/72  12/13/18 120/68    Allergies  Allergen Reactions  . Penicillins Anaphylaxis    Did it involve swelling of the  face/tongue/throat, SOB, or low BP? yes Did it involve sudden or severe rash/hives, skin peeling, or any reaction on the inside of your mouth or nose? no Did you need to seek medical attention at a hospital or doctor's office? yes When did it last happen?pt was 20 If all above answers are "NO", may proceed with cephalosporin use.   . Codeine     hyper    Medications Reviewed Today    Reviewed by Jamse Arn, MD (Physician) on 01/24/19 at [phone]  Med List Status: <None>  Medication Order Taking? Sig Documenting Provider Last Dose Status Informant  aspirin EC 325 MG EC tablet [id number] Yes Take 1 tablet (325 mg total) by mouth daily. Metzger-Cihelka, Desiree, NP Taking Active   cholecalciferol (VITAMIN D3) 25 MCG (1000 UT) tablet [id number] Yes Take 1,000 Units by mouth daily. [provider] Taking Active Self  diclofenac sodium (VOLTAREN) 1 % GEL [id number]  Apply 2 g topically 4 (four) times daily. Jamse Arn, MD  Active   escitalopram (LEXAPRO) 10 MG tablet [id number] Yes Take 10 mg by mouth daily. [provider] Taking Active   furosemide (LASIX) 20 MG tablet [id number] Yes TK 1 T PO D PRF SWELLING [provider] Taking Active   lansoprazole (PREVACID) 15 MG capsule [id number] Yes Take 15 mg by mouth daily at 12 noon. [provider] Taking Active Self  levothyroxine (SYNTHROID, LEVOTHROID) 112 MCG tablet [id number] Yes Take 112 mcg by  mouth daily before breakfast. [provider] Taking Active Self  metoprolol succinate (TOPROL-XL) 25 MG 24 hr tablet [id number] Yes Take 1 tablet (25 mg total) by mouth daily. Bary Leriche, PA-C Taking Active   roflumilast (DALIRESP) 500 MCG TABS tablet [id number] Yes Take 500 mcg by mouth daily.  [provider] Taking Active Self  SPIRIVA HANDIHALER 18 MCG inhalation capsule [id number] Yes  [provider] Taking Active           Assessment: Drugs sorted by  system:  Neurologic/Psychologic: escitalopram  Hematologic: aspirin 325  Cardiovascular: metoprolol succinate  Pulmonary/Allergy: roflumilast, tiotropium inhaler, albuterol inhaler  Gastrointestinal: lansoprazole  Endocrine: levothyroxine  Topical: diclofenac gel  Vitamins/Minerals/Supplements: Vitamin D  Medication Review Findings:  . Taking Aspirin 352m BID, likely increased from once daily to twice daily after recent hip surgery for VTE prophyalxis, has not been adjusted back to usual dose, patient complaining of stomach pain o Will contact PCP    Medication Assistance Findings:  Medication assistance needs identified: Spiriva and Daliresp  Extra Help:  May be eligible for Partial Extra Help Low Income Subsidy based on reported income and assets.  Will assist patient with application.  She may also need denial letter for patient assistance program applications if income < 150% FPL.   Patient Assistance Programs: Spiriva made by [place] met: Yes o Out-of-pocket prescription expenditure met:   Not Applicable - Patient has met application requirements to apply for this program.  - Reviewed program requirements with patient.    Daliresp made by [place]uneminrequirement met: Yes o Out-of-pocket prescription expenditure met:   Yes - Patient has met application requirements to apply for this program.  - Reviewed program requirements with patient.     Plan: . Assisted patient with applying for Extra Help.  . I will route patient assistance letter to [facility] Easttechnician who will coordinate patient assistance program application process for medications listed above.  [facility] will assist with obtaining all required documents from both patient and provider(s) and submit application(s) once completed.   . Will f/u with PCP re: Aspirin  CRalene Bathe PharmD, [phone]

## 2019-03-10 NOTE — Progress Notes (Signed)
Carelink Summary Report / Loop Recorder 

## 2019-03-11 ENCOUNTER — Other Ambulatory Visit: Payer: Self-pay | Admitting: Pharmacy Technician

## 2019-03-11 ENCOUNTER — Ambulatory Visit: Payer: Self-pay | Admitting: Pharmacist

## 2019-03-11 NOTE — Patient Outreach (Signed)
[place] [facility]) Care Management  03/11/2019  Stephanie Love 06/16/1936 [id number]                                       Medication Assistance Referral  Referral From: [place]: [place]ann [place] / B-I Patient application portion:  Mailed Provider application portion: Faxed  to Dr. Presley Raddle Provider address/fax verified via: Office website  Medication/Company: [employer] / [place] Patient application portion:  Mailed Provider application portion: Faxed  to Dr. Presley Raddle Provider address/fax verified via: Office website   Follow up:  Will follow up with patient in 7-10 business days to confirm application(s) have been received.  [name] Chana Bode [place] Certified Pharmacy Technician [place] Management Direct [phone]

## 2019-03-14 ENCOUNTER — Ambulatory Visit: Payer: Self-pay | Admitting: Pharmacist

## 2019-03-14 ENCOUNTER — Other Ambulatory Visit: Payer: Self-pay | Admitting: Pharmacist

## 2019-03-14 NOTE — Patient Outreach (Addendum)
[place] [facility]) Care Management  [place] 03/14/2019  CHERRIE WELLHAUSEN [birth year] [id number]  Reason for call: f/u on ASA dose  No response yet from Dr. Meta Hatchet office re: ASA dose.  Call placed to patient to let her know that I have not had any callback yet.  Patient states she will call Dr. Nyra Capes herself today to also inquire.  She is worried about having another stroke but also does not want to continue taking ASA BID due to stomach pain unless absolutely necessary.  She is aware to watch for patient assistance program applications to arrive in the mail this week.    [name], PharmD, [place] [phone]   Addendum: Incoming call from patient.  MD clarified that she is only to take ASA 81mg .  Patient calling to inform me so no further calls necessary to MD office.   [name], PharmD, [place] [phone]

## 2019-03-16 ENCOUNTER — Telehealth: Payer: Self-pay

## 2019-03-16 NOTE — Telephone Encounter (Signed)
Left message for patient to regarding disconnected monitor 

## 2019-03-16 NOTE — Telephone Encounter (Signed)
Transmission received.

## 2019-03-16 NOTE — Telephone Encounter (Signed)
° ° ° °  1. Has your device fired? NO  2. Is you device beeping? NO  3. Are you experiencing draining or swelling at device site? NO  4. Are you calling to see if we received your device transmission? Returning call  5. Have you passed out? NO    Please route to [place]

## 2019-03-16 NOTE — Telephone Encounter (Signed)
LMOVM for pt to return call 

## 2019-03-17 NOTE — Telephone Encounter (Signed)
Full report received and monitor up to date.

## 2019-03-21 DIAGNOSIS — Z23 Encounter for immunization: Secondary | ICD-10-CM | POA: Diagnosis not present

## 2019-03-23 DIAGNOSIS — J449 Chronic obstructive pulmonary disease, unspecified: Secondary | ICD-10-CM | POA: Diagnosis not present

## 2019-03-23 DIAGNOSIS — I129 Hypertensive chronic kidney disease with stage 1 through stage 4 chronic kidney disease, or unspecified chronic kidney disease: Secondary | ICD-10-CM | POA: Diagnosis not present

## 2019-03-23 DIAGNOSIS — E785 Hyperlipidemia, unspecified: Secondary | ICD-10-CM | POA: Diagnosis not present

## 2019-03-23 DIAGNOSIS — I69359 Hemiplegia and hemiparesis following cerebral infarction affecting unspecified side: Secondary | ICD-10-CM | POA: Diagnosis not present

## 2019-03-27 LAB — CUP PACEART REMOTE DEVICE CHECK
Date Time Interrogation Session: 20201004040500
Implantable Pulse Generator Implant Date: 20200526

## 2019-03-28 ENCOUNTER — Ambulatory Visit (INDEPENDENT_AMBULATORY_CARE_PROVIDER_SITE_OTHER): Payer: Medicare Other | Admitting: *Deleted

## 2019-03-28 DIAGNOSIS — I639 Cerebral infarction, unspecified: Secondary | ICD-10-CM | POA: Diagnosis not present

## 2019-03-29 DIAGNOSIS — S72002A Fracture of unspecified part of neck of left femur, initial encounter for closed fracture: Secondary | ICD-10-CM | POA: Diagnosis not present

## 2019-03-29 LAB — CUP PACEART REMOTE DEVICE CHECK
Date Time Interrogation Session: 20201005181536
Implantable Pulse Generator Implant Date: 20200526

## 2019-03-31 ENCOUNTER — Ambulatory Visit: Payer: Medicare Other | Admitting: Physical Medicine & Rehabilitation

## 2019-04-05 ENCOUNTER — Other Ambulatory Visit: Payer: Self-pay

## 2019-04-05 ENCOUNTER — Encounter: Payer: Medicare Other | Attending: Physical Medicine & Rehabilitation | Admitting: Physical Medicine & Rehabilitation

## 2019-04-05 ENCOUNTER — Other Ambulatory Visit: Payer: Self-pay | Admitting: Pharmacy Technician

## 2019-04-05 ENCOUNTER — Encounter: Payer: Self-pay | Admitting: Physical Medicine & Rehabilitation

## 2019-04-05 VITALS — BP 144/73 | HR 83 | Temp 97.5°F | Ht 62.0 in | Wt 124.0 lb

## 2019-04-05 DIAGNOSIS — G479 Sleep disorder, unspecified: Secondary | ICD-10-CM

## 2019-04-05 DIAGNOSIS — I69398 Other sequelae of cerebral infarction: Secondary | ICD-10-CM | POA: Diagnosis not present

## 2019-04-05 DIAGNOSIS — Z96649 Presence of unspecified artificial hip joint: Secondary | ICD-10-CM | POA: Diagnosis not present

## 2019-04-05 DIAGNOSIS — I639 Cerebral infarction, unspecified: Secondary | ICD-10-CM

## 2019-04-05 DIAGNOSIS — R262 Difficulty in walking, not elsewhere classified: Secondary | ICD-10-CM | POA: Diagnosis not present

## 2019-04-05 DIAGNOSIS — M25552 Pain in left hip: Secondary | ICD-10-CM | POA: Diagnosis not present

## 2019-04-05 DIAGNOSIS — R531 Weakness: Secondary | ICD-10-CM | POA: Insufficient documentation

## 2019-04-05 DIAGNOSIS — J449 Chronic obstructive pulmonary disease, unspecified: Secondary | ICD-10-CM | POA: Insufficient documentation

## 2019-04-05 DIAGNOSIS — R251 Tremor, unspecified: Secondary | ICD-10-CM | POA: Diagnosis not present

## 2019-04-05 DIAGNOSIS — I10 Essential (primary) hypertension: Secondary | ICD-10-CM | POA: Insufficient documentation

## 2019-04-05 DIAGNOSIS — Z87891 Personal history of nicotine dependence: Secondary | ICD-10-CM | POA: Insufficient documentation

## 2019-04-05 DIAGNOSIS — R269 Unspecified abnormalities of gait and mobility: Secondary | ICD-10-CM | POA: Insufficient documentation

## 2019-04-05 DIAGNOSIS — E039 Hypothyroidism, unspecified: Secondary | ICD-10-CM | POA: Insufficient documentation

## 2019-04-05 DIAGNOSIS — K7581 Nonalcoholic steatohepatitis (NASH): Secondary | ICD-10-CM | POA: Diagnosis not present

## 2019-04-05 DIAGNOSIS — D649 Anemia, unspecified: Secondary | ICD-10-CM | POA: Diagnosis not present

## 2019-04-05 HISTORY — DX: Presence of unspecified artificial hip joint: Z96.649

## 2019-04-05 NOTE — Progress Notes (Signed)
Subjective:    Patient ID: Stephanie Love, female    DOB: 07-20-1935, 83 y.o.   MRN: [id number]  HPI Female with history of COPD, NASH, anemia presents for follow up for bilateral infarcts and brain bleeds, now s/p left hemiarthroplasty.  Daughter supplements history.  Last clinic visit on 01/24/2019.  Since that time, patient states she broke her left hip and had partial hip replacement.  Denies falls.  Pain Inventory Average Pain 5 Pain Right Now 5 My pain is stabbing and aching  In the last 24 hours, has pain interfered with the following? General activity 4 Relation with others 5 Enjoyment of life 3 What TIME of day is your pain at its worst? morning, night Sleep (in general) Poor  Pain is worse with: walking, bending and some activites Pain improves with: rest and therapy/exercise Relief from Meds: 0  Mobility walk without assistance walk with assistance use a walker how many minutes can you walk? 15 ability to climb steps?  yes do you drive?  yes Do you have any goals in this area?  yes  Function retired Do you have any goals in this area?  no  Neuro/Psych tremor trouble walking depression  Prior Studies Any changes since last visit?  no  Physicians involved in your care Any changes since last visit?  no   Family History  Problem Relation Age of Onset  . Cancer Mother   . Diabetes Father   . Heart disease Father    Social History   Socioeconomic History  . Marital status: Widowed    Spouse name: Not on file  . Number of children: Not on file  . Years of education: Not on file  . Highest education level: Not on file  Occupational History  . Not on file  Social Needs  . Financial resource strain: Not very hard  . Food insecurity    Worry: Never true    Inability: Never true  . Transportation needs    Medical: No    Non-medical: No  Tobacco Use  . Smoking status: Former Smoker    Types: Cigarettes    Quit date: 11/16/1981    Years since  quitting: 37.4  . Smokeless tobacco: Never Used  Substance and Sexual Activity  . Alcohol use: Not Currently  . Drug use: Never  . Sexual activity: Not Currently  Lifestyle  . Physical activity    Days per week: 0 days    Minutes per session: 0 min  . Stress: Not at all  Relationships  . Social Herbalist on phone: Twice a week    Gets together: Twice a week    Attends religious service: 1 to 4 times per year    Active member of club or organization: Yes    Attends meetings of clubs or organizations: 1 to 4 times per year    Relationship status: Widowed  Other Topics Concern  . Not on file  Social History Narrative  . Not on file   Past Surgical History:  Procedure Laterality Date  . ABDOMINAL HYSTERECTOMY  2012  . AMPUTATION TOE     right 2nd toe   . CHOLECYSTECTOMY  2013  . LOOP RECORDER INSERTION N/A 11/16/2018   Procedure: LOOP RECORDER INSERTION;  Surgeon: Thompson Grayer, MD;  Location: [facility];  Service: Cardiovascular;  Laterality: N/A;   Past Medical History:  Diagnosis Date  . Anemia   . Arthritis   . COPD (chronic  obstructive pulmonary disease) ([place])   . Diverticulosis   . GERD (gastroesophageal reflux disease)   . Hepatitis A virus infection 1963  . Hypertension   . Hypothyroid   . Microhematuria   . NASH (nonalcoholic steatohepatitis)   . Osteoarthritis   . Stroke ([place]) 11/13/2018  . UTI (urinary tract infection)    BP (!) 144/73   Pulse 83   Temp (!) 97.5 F (36.4 C)   Ht 5\' 2"  (1.575 m)   Wt 124 lb (56.2 kg)   SpO2 98%   BMI 22.68 kg/m   Opioid Risk Score:   Fall Risk Score:  `1  Depression screen PHQ 2/9  Depression screen [facility] 2/9 03/09/2019 01/24/2019 08/10/2018  Decreased Interest 0 0 0  Down, Depressed, Hopeless 1 0 1  PHQ - 2 Score 1 0 1  Altered sleeping - 2 -  Tired, decreased energy - 1 -  Change in appetite - 2 -  Feeling bad or failure about yourself  - 2 -  Trouble concentrating - 1 -  Moving slowly or  fidgety/restless - 0 -  Suicidal thoughts - 0 -  PHQ-9 Score - 8 -  Difficult doing work/chores - Somewhat difficult -   Review of Systems  Constitutional: Negative.   HENT: Negative.   Eyes: Negative.   Respiratory: Negative.   Cardiovascular: Negative.   Gastrointestinal: Negative.   Endocrine: Negative.   Genitourinary: Negative.   Musculoskeletal: Positive for arthralgias and gait problem.  Skin: Negative.   Allergic/Immunologic: Negative.   Neurological: Positive for tremors.  Hematological: Negative.   Psychiatric/Behavioral: Negative.       Objective:   Physical Exam Constitutional: No distress . Vital signs reviewed. HENT:  Normocephalic. Atraumatic.  Eyes:  EOMI. No discharge. Cardiovascular: No JVD. Respiratory:  Normal effort. GI: Non-distended. Musc:  Mild pain with TTP left hip  Gait: Antalgic with rollator Neurological:  Alert Motor:  Left upper extremity: 4+-5 proximal to distal Left lower extremity: HF 4-/5 (pain inhibition), KE 4/5 (pain inhibition), HOH Skin: Skin is warm and dry.  Psychiatric: She has a normal mood and affect. Her behavior is normal.     Assessment & Plan:  Female with history of COPD, NASH, anemia presents for follow up for bilateral infarcts and brain bleeds, now s/p left hemiarthroplasty.  1. Weakness, balance deficits with posterior lean secondary to bilateral infarcts as well as right brain bleeds.             Continue HEP  Cont follow up with Neuro   2. Gait abnormality   Cont HEP  Cont rollator for safety  3. Left hip pain  Now s/p hemiarthroplasty  Cont Voltaren gel  Limit Tylenol  Patient would like to follow up with Surgery  4. Sleep disturbance  Cont Melatonin

## 2019-04-05 NOTE — Patient Outreach (Signed)
[place] [facility]) Care Management  04/05/2019  Stephanie Love 06-May-1936 [id number]    Successful call placed to patient regarding patient assistance application(s) for Daliresp and Spiriva , HIPAA identifiers verified. [name] states that she has not received applications. Patient requests that I mail out another set.  Follow up:  Will follow up with patient in 7-10  business days to confirm application has been received.  [name] Chana Bode [place] Certified Pharmacy Technician [place] Management Direct [phone]

## 2019-04-06 ENCOUNTER — Telehealth: Payer: Self-pay

## 2019-04-06 NOTE — Telephone Encounter (Signed)
Called pt to inform her that our provider NP Frann Rider is not feeling well and will be out of the office tomorrow. So we would need to r/s her appointment.  Was able to r/s her.

## 2019-04-06 NOTE — Progress Notes (Signed)
Carelink Summary Report / Loop Recorder 

## 2019-04-07 ENCOUNTER — Ambulatory Visit: Payer: Medicare Other | Admitting: Adult Health

## 2019-04-11 DIAGNOSIS — D509 Iron deficiency anemia, unspecified: Secondary | ICD-10-CM | POA: Diagnosis not present

## 2019-04-11 DIAGNOSIS — Z6821 Body mass index (BMI) 21.0-21.9, adult: Secondary | ICD-10-CM | POA: Diagnosis not present

## 2019-04-11 DIAGNOSIS — R5383 Other fatigue: Secondary | ICD-10-CM | POA: Diagnosis not present

## 2019-04-11 DIAGNOSIS — M7989 Other specified soft tissue disorders: Secondary | ICD-10-CM | POA: Diagnosis not present

## 2019-04-12 DIAGNOSIS — D509 Iron deficiency anemia, unspecified: Secondary | ICD-10-CM | POA: Diagnosis not present

## 2019-04-19 DIAGNOSIS — D509 Iron deficiency anemia, unspecified: Secondary | ICD-10-CM | POA: Diagnosis not present

## 2019-04-22 DIAGNOSIS — I129 Hypertensive chronic kidney disease with stage 1 through stage 4 chronic kidney disease, or unspecified chronic kidney disease: Secondary | ICD-10-CM | POA: Diagnosis not present

## 2019-04-22 DIAGNOSIS — J449 Chronic obstructive pulmonary disease, unspecified: Secondary | ICD-10-CM | POA: Diagnosis not present

## 2019-04-22 DIAGNOSIS — E785 Hyperlipidemia, unspecified: Secondary | ICD-10-CM | POA: Diagnosis not present

## 2019-04-22 DIAGNOSIS — I69359 Hemiplegia and hemiparesis following cerebral infarction affecting unspecified side: Secondary | ICD-10-CM | POA: Diagnosis not present

## 2019-04-25 DIAGNOSIS — D509 Iron deficiency anemia, unspecified: Secondary | ICD-10-CM | POA: Diagnosis not present

## 2019-04-25 DIAGNOSIS — Z139 Encounter for screening, unspecified: Secondary | ICD-10-CM | POA: Diagnosis not present

## 2019-04-25 DIAGNOSIS — J411 Mucopurulent chronic bronchitis: Secondary | ICD-10-CM | POA: Diagnosis not present

## 2019-04-25 DIAGNOSIS — Z6822 Body mass index (BMI) 22.0-22.9, adult: Secondary | ICD-10-CM | POA: Diagnosis not present

## 2019-04-28 ENCOUNTER — Other Ambulatory Visit: Payer: Self-pay | Admitting: Pharmacy Technician

## 2019-04-28 NOTE — Patient Outreach (Signed)
[place] [facility]) Care Management  04/28/2019  Stephanie Love [birth year] [id number]    Successful call placed to patient regarding patient assistance application(s) for Daliresp and Spiriva , HIPAA identifiers verified. [name] confirms that she received patient assistance applications. She states that she received a letter stating she was denied for [place] she she was unsure if she would still qualify for patient assistance. Informed her that we would still be able to continue the application process for assistance. She states that she will try to fill forms out tomorrow and send them back in.  Follow up:  Will route note to [facility] for case closure if document(s) have not been received in the next 15 business days.  [name] [name] Certified Pharmacy Technician [place] Management Direct Dial:[phone]

## 2019-05-01 LAB — CUP PACEART REMOTE DEVICE CHECK
Date Time Interrogation Session: 20201107181626
Implantable Pulse Generator Implant Date: 20200526

## 2019-05-02 ENCOUNTER — Ambulatory Visit (INDEPENDENT_AMBULATORY_CARE_PROVIDER_SITE_OTHER): Payer: Medicare Other | Admitting: *Deleted

## 2019-05-02 DIAGNOSIS — I639 Cerebral infarction, unspecified: Secondary | ICD-10-CM

## 2019-05-09 DIAGNOSIS — Z6822 Body mass index (BMI) 22.0-22.9, adult: Secondary | ICD-10-CM | POA: Diagnosis not present

## 2019-05-09 DIAGNOSIS — D509 Iron deficiency anemia, unspecified: Secondary | ICD-10-CM | POA: Diagnosis not present

## 2019-05-13 DIAGNOSIS — D509 Iron deficiency anemia, unspecified: Secondary | ICD-10-CM | POA: Diagnosis not present

## 2019-05-16 DIAGNOSIS — Z6822 Body mass index (BMI) 22.0-22.9, adult: Secondary | ICD-10-CM | POA: Diagnosis not present

## 2019-05-16 DIAGNOSIS — D509 Iron deficiency anemia, unspecified: Secondary | ICD-10-CM | POA: Diagnosis not present

## 2019-05-23 ENCOUNTER — Other Ambulatory Visit: Payer: Self-pay | Admitting: Pharmacist

## 2019-05-23 DIAGNOSIS — D509 Iron deficiency anemia, unspecified: Secondary | ICD-10-CM | POA: Diagnosis not present

## 2019-05-23 DIAGNOSIS — I129 Hypertensive chronic kidney disease with stage 1 through stage 4 chronic kidney disease, or unspecified chronic kidney disease: Secondary | ICD-10-CM | POA: Diagnosis not present

## 2019-05-23 DIAGNOSIS — J449 Chronic obstructive pulmonary disease, unspecified: Secondary | ICD-10-CM | POA: Diagnosis not present

## 2019-05-23 DIAGNOSIS — I69359 Hemiplegia and hemiparesis following cerebral infarction affecting unspecified side: Secondary | ICD-10-CM | POA: Diagnosis not present

## 2019-05-23 DIAGNOSIS — E785 Hyperlipidemia, unspecified: Secondary | ICD-10-CM | POA: Diagnosis not present

## 2019-05-23 NOTE — Patient Outreach (Signed)
[place] [facility]) Care Management  [place]   05/23/2019  ELMER ALESSI 1935/12/02 [id number]  [facility] pharmacy has mailed out patient assistance program application(s) for Daliresp and Spiriva to patient and confirmed that application(s) received.   Patient has not yet returned the application(s) to [facility].  [facility] pharmacy will therefore close case at this time until patient able to return application(s).  If / when application(s) received at [facility], pharmacy can re-pen case and assist with submitting application(s) for processing.   [name], PharmD, [place] [phone]

## 2019-05-25 ENCOUNTER — Encounter: Payer: Self-pay | Admitting: Adult Health

## 2019-05-25 ENCOUNTER — Other Ambulatory Visit: Payer: Self-pay

## 2019-05-25 ENCOUNTER — Ambulatory Visit (INDEPENDENT_AMBULATORY_CARE_PROVIDER_SITE_OTHER): Payer: Medicare Other | Admitting: Adult Health

## 2019-05-25 VITALS — BP 140/70 | HR 83 | Temp 97.0°F | Ht 62.0 in | Wt 129.2 lb

## 2019-05-25 DIAGNOSIS — I10 Essential (primary) hypertension: Secondary | ICD-10-CM | POA: Diagnosis not present

## 2019-05-25 DIAGNOSIS — I639 Cerebral infarction, unspecified: Secondary | ICD-10-CM

## 2019-05-25 DIAGNOSIS — R2689 Other abnormalities of gait and mobility: Secondary | ICD-10-CM | POA: Diagnosis not present

## 2019-05-25 DIAGNOSIS — R278 Other lack of coordination: Secondary | ICD-10-CM | POA: Diagnosis not present

## 2019-05-25 DIAGNOSIS — I69398 Other sequelae of cerebral infarction: Secondary | ICD-10-CM | POA: Diagnosis not present

## 2019-05-25 NOTE — Progress Notes (Signed)
[facility] [address] [place]. [place] [address] [phone]       OFFICE FOLLOW-UP NOTE  [name] Date of Birth:  1936-01-21 Medical Record Number:  [id number]   HPI:  Initial visit 01/04/2019 Dr. Leonie Man: Stephanie Love is a 83 year old pleasant Caucasian lady seen today for office first follow-up visit following hospital admission for stroke in May 2020.  She is accompanied by her daughter.  History is obtained from them, review of electronic medical records and I personally reviewed imaging films in PACS. Stephanie Smithis a 22 y.o.femalepast medical history of COPD, hypertension, hypothyroidism,GERDresents to the emergency room at [facility] with sudden onset dizzinessthat began around 6:30 PM on 11/14/18.She had nausea and vomited multiple times. She went to [facility] and was evaluated by tele neurology.On assessment,noted to have disconjugate gaze, dysarthria and left-sided ataxia.Risk versus benefits was discussed with patient and she is received IV TPA (start time was 9.22pm). Herblood pressure remained below 99991111 systolic per reports.[facility]. Stroke work up completed, inc Loop monitor placed for long term ongoing cardiac monitoring in setting of cryptogenic stroke. CT angiogram of the brain and neck were obtained at [facility] which showed no large vessel stenosis or occlusion.  MRI scan of the brain showed acute left superior cerebellar artery infarct as well as small acute bilateral parietal and right frontal infarcts.  New small volume right parietal subarachnoid hemorrhage and small right subdural hematoma without mass-effect.  2D echo showed normal ejection fraction without cardiac source of embolism.  LDL cholesterol 65 mg percent and hemoglobin A1c was 5.9.  Patient was seen by physical occupational speech therapy and felt to be good patient for inpatient rehab.  She had mild  left-sided dysmetria at the time of discharge.  Patient states she is done well.  She is presently at home living with her daughter.  She feels incoordination and dizziness have improved.  She is getting home physical and occupational therapy and making good steady progress.  She can walk indoors short distances by herself and uses a walker for long distances.  She has not started going to her own apartment during the daytime but spends the night in the daughter's home with her.  She is thinking about moving into her apartment full-time over the next few weeks.  She still feels her balance is not back to baseline and at times she has trouble controlling her left side.  She is tolerating aspirin well without bruising or bleeding.  She states her blood pressures well controlled today it is 129/72.  She has no new complaints.  Update 05/25/2019: Stephanie Love is an 83 year old female who is being seen today for stroke follow-up.  Residual deficits of mild left-sided dysmetria and imbalance.  She does report great improvement but unfortunately since prior visit she broke her left hip after a fall and had subsequent partial hip replacement.  She does endorse that back of recovery due to hip.  She is able to ambulate without cane or rolling walker.  She does report occasional short-term memory concerns such as forgetting where she placed an item.  She has returned to living on her home maintaining all ADLs and IADLs without difficulty.  She has continued on aspirin 325 mg daily without bleeding or bruising.  Blood pressure today 140/70.  Loop recorder has not shown atrial fibrillation thus far.  Denies new or worsening stroke/TIA symptoms.     ROS:   14  system review of systems is positive for gait imbalance, hip pain and all other systems negative  PMH:  Past Medical History:  Diagnosis Date   Anemia    Arthritis    COPD (chronic obstructive pulmonary disease) (HCC)    Diverticulosis    GERD  (gastroesophageal reflux disease)    Hepatitis A virus infection 1963   Hypertension    Hypothyroid    Microhematuria    NASH (nonalcoholic steatohepatitis)    Osteoarthritis    Stroke ([place]) 11/13/2018   UTI (urinary tract infection)     Social History:  Social History   Socioeconomic History   Marital status: Widowed    Spouse name: Not on file   Number of children: Not on file   Years of education: Not on file   Highest education level: Not on file  Occupational History   Not on file  Social Needs   Financial resource strain: Not very hard   Food insecurity    Worry: Never true    Inability: Never true   Transportation needs    Medical: No    Non-medical: No  Tobacco Use   Smoking status: Former Smoker    Types: Cigarettes    Quit date: 11/16/1981    Years since quitting: 37.5   Smokeless tobacco: Never Used  Substance and Sexual Activity   Alcohol use: Not Currently   Drug use: Never   Sexual activity: Not Currently  Lifestyle   Physical activity    Days per week: 0 days    Minutes per session: 0 min   Stress: Not at all  Relationships   Social connections    Talks on phone: Twice a week    Gets together: Twice a week    Attends religious service: 1 to 4 times per year    Active member of club or organization: Yes    Attends meetings of clubs or organizations: 1 to 4 times per year    Relationship status: Widowed   Intimate partner violence    Fear of current or ex partner: Patient refused    Emotionally abused: Patient refused    Physically abused: Patient refused    Forced sexual activity: Patient refused  Other Topics Concern   Not on file  Social History Narrative   Not on file    Medications:   Current Outpatient Medications on File Prior to Visit  Medication Sig Dispense Refill   albuterol (VENTOLIN HFA) 108 (90 Base) MCG/ACT inhaler Inhale 1-2 puffs into the lungs every 6 (six) hours as needed for wheezing or  shortness of breath.     aspirin EC 325 MG EC tablet Take 1 tablet (325 mg total) by mouth daily. 30 tablet 0   cholecalciferol (VITAMIN D3) 25 MCG (1000 UT) tablet Take 1,000 Units by mouth daily.     diclofenac sodium (VOLTAREN) 1 % GEL Apply 2 g topically 4 (four) times daily. (Patient taking differently: Apply 2 g topically daily as needed. ) 350 g 0   escitalopram (LEXAPRO) 10 MG tablet Take 10 mg by mouth daily.     lansoprazole (PREVACID) 15 MG capsule Take 15 mg by mouth daily at 12 noon.     levothyroxine (SYNTHROID, LEVOTHROID) 112 MCG tablet Take 112 mcg by mouth daily before breakfast.     metoprolol succinate (TOPROL-XL) 25 MG 24 hr tablet Take 1 tablet (25 mg total) by mouth daily. 30 tablet 1   roflumilast (DALIRESP) 500 MCG TABS tablet Take 500 mcg  by mouth daily.      SPIRIVA HANDIHALER 18 MCG inhalation capsule Place 18 mcg into inhaler and inhale daily.      No current facility-administered medications on file prior to visit.     Allergies:   Allergies  Allergen Reactions   Penicillins Anaphylaxis    Did it involve swelling of the face/tongue/throat, SOB, or low BP? yes Did it involve sudden or severe rash/hives, skin peeling, or any reaction on the inside of your mouth or nose? no Did you need to seek medical attention at a hospital or doctor's office? yes When did it last happen?pt was 22 If all above answers are "NO", may proceed with cephalosporin use.    Codeine     hyper    Physical Exam  Today's Vitals   05/25/19 0749  BP: 140/70  Pulse: 83  Temp: (!) 97 F (36.1 C)  Weight: 129 lb 3.2 oz (58.6 kg)  Height: 5\' 2"  (1.575 m)   Body mass index is 23.63 kg/m.   General: Very pleasant elderly Caucasian lady seated, in no evident distress Head: head normocephalic and atraumatic.  Neck: supple with no carotid or supraclavicular bruits Cardiovascular: regular rate and rhythm, no murmurs Musculoskeletal: no deformity except mild  kyphoscoliosis and decreased left hip ROM Skin:  no rash/petichiae Vascular:  Normal pulses all extremities  Neurologic Exam Mental Status: Awake and fully alert. Oriented to place and time. Recent memory slightly diminished and remote memory intact. Attention span, concentration and fund of knowledge appropriate. Mood and affect appropriate.  Cranial Nerves: Pupils equal, briskly reactive to light. Extraocular movements full without nystagmus. Visual fields full to confrontation. Hearing intact. Facial sensation intact. Face, tongue, palate moves normally and symmetrically.  Motor: Normal bulk and tone. Normal strength in all tested extremity muscles. Sensory.: intact to touch ,pinprick .position and vibratory sensation.  Coordination: Rapid alternating movements normal in all extremities. Finger-to-nose and heel-to-shin evident for dysmetria on left side and normal on right side. Gait and Station: Arises from chair without difficulty. Stance is slightly stooped. Gait demonstrates short slow steps and slight imbalance but able to ambulate without device.  Unable to perform tandem walking. Reflexes: 1+ and symmetric. Toes downgoing.   Imaging: CTangio brain and neck  11/13/18 at [place]: 1. No evidence of acute intracranial abnormality. 2. Minimal intracranial atherosclerosis without large vessel occlusion, significant stenosis, or aneurysm. 3. Widely patent cervical carotid and vertebral arteries. 4. Aortic Atherosclerosis (ICD10-I70.0) and Emphysema (ICD10-J43.9).  MRI Brain 11/14/2018: 1. 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 2. Punctate acute bilateral parietal and right frontal infarcts. 3. Small volume right parieto-occipital subarachnoid hemorrhage and small right-sided subdural hematoma without mass effect.   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   Vascular risk factors of hypertension and age only.  She unfortunately suffered a fall with resultant left hip fracture and had hip replacement.  Residual stroke deficits of mild imbalance and left-sided dysmetria     PLAN: -Continue aspirin 325 mg daily for secondary stroke prevention -does report occasional upset stomach with use of aspirin therefore recommended trialing enteric-coated -Continue to follow with PCP for HTN management -Advised to continue to do exercises as recommended during therapy for potential ongoing improvement -Advised to do memory exercises and compensation strategies for concerns of mild short-term memory -Continue to monitor loop recorder -maintain strict control of hypertension with blood pressure goal below 130/90, diabetes with  hemoglobin A1c goal below 6.5% and lipids with LDL cholesterol goal below 70 mg/dL. I also advised the patient to eat a healthy diet with plenty of whole grains, cereals, fruits and vegetables, exercise regularly and maintain ideal body weight.    Follow-up in 6 months or call earlier if needed  Greater than 50% of time during this 25 minute visit was spent on counseling,explanation of diagnosis of cryptogenic stroke, discussion regarding importance of stroke risk factor management, discussion regarding residual deficits and potential ongoing improvement, planning of further management, discussion with patient answering all questions to satisfaction  Frann Rider, AGNP-BC  [facility] [address] [place], [place] [address]  Phone [phone] Fax [phone] Note: This document was prepared with digital dictation and possible smart phrase technology. Any transcriptional errors that result from this process are unintentional.

## 2019-05-25 NOTE — Progress Notes (Signed)
I agree with the above plan 

## 2019-05-25 NOTE — Patient Instructions (Signed)
Continue aspirin 325 mg daily for secondary stroke prevention -you can trial enteric-coated (EC) aspirin which may help decrease stomach symptoms  Continue to follow up with PCP regarding blood pressure management   Continue to do exercises as recommended during therapy for ongoing improvement  Recommend memory exercises such as crossword puzzles, word search, card games and reading  Continue to monitor blood pressure at home  Maintain strict control of hypertension with blood pressure goal below 130/90, diabetes with hemoglobin A1c goal below 6.5% and cholesterol with LDL cholesterol (bad cholesterol) goal below 70 mg/dL. I also advised the patient to eat a healthy diet with plenty of whole grains, cereals, fruits and vegetables, exercise regularly and maintain ideal body weight.  Followup in the future with me in 6 months or call earlier if needed       Thank you for coming to see [place] at [facility]. I hope we have been able to provide you high quality care today.  You may receive a patient satisfaction survey over the next few weeks. We would appreciate your feedback and comments so that we may continue to improve ourselves and the health of our patients.

## 2019-05-30 DIAGNOSIS — R609 Edema, unspecified: Secondary | ICD-10-CM | POA: Diagnosis not present

## 2019-05-30 DIAGNOSIS — D509 Iron deficiency anemia, unspecified: Secondary | ICD-10-CM | POA: Diagnosis not present

## 2019-05-30 DIAGNOSIS — Z139 Encounter for screening, unspecified: Secondary | ICD-10-CM | POA: Diagnosis not present

## 2019-05-30 DIAGNOSIS — L209 Atopic dermatitis, unspecified: Secondary | ICD-10-CM | POA: Diagnosis not present

## 2019-05-30 DIAGNOSIS — K644 Residual hemorrhoidal skin tags: Secondary | ICD-10-CM | POA: Diagnosis not present

## 2019-06-01 NOTE — Progress Notes (Signed)
Carelink Summary Report / Loop Recorder 

## 2019-06-02 ENCOUNTER — Ambulatory Visit (INDEPENDENT_AMBULATORY_CARE_PROVIDER_SITE_OTHER): Payer: Medicare Other | Admitting: *Deleted

## 2019-06-02 DIAGNOSIS — I63119 Cerebral infarction due to embolism of unspecified vertebral artery: Secondary | ICD-10-CM | POA: Diagnosis not present

## 2019-06-02 LAB — CUP PACEART REMOTE DEVICE CHECK
Date Time Interrogation Session: 20201210131951
Implantable Pulse Generator Implant Date: 20200526

## 2019-06-27 DIAGNOSIS — I69359 Hemiplegia and hemiparesis following cerebral infarction affecting unspecified side: Secondary | ICD-10-CM | POA: Diagnosis not present

## 2019-06-27 DIAGNOSIS — F339 Major depressive disorder, recurrent, unspecified: Secondary | ICD-10-CM | POA: Diagnosis not present

## 2019-06-27 DIAGNOSIS — D509 Iron deficiency anemia, unspecified: Secondary | ICD-10-CM | POA: Diagnosis not present

## 2019-06-27 DIAGNOSIS — I471 Supraventricular tachycardia: Secondary | ICD-10-CM | POA: Diagnosis not present

## 2019-07-05 ENCOUNTER — Ambulatory Visit (INDEPENDENT_AMBULATORY_CARE_PROVIDER_SITE_OTHER): Payer: Medicare Other | Admitting: *Deleted

## 2019-07-05 DIAGNOSIS — I639 Cerebral infarction, unspecified: Secondary | ICD-10-CM

## 2019-07-05 LAB — CUP PACEART REMOTE DEVICE CHECK
Date Time Interrogation Session: 20210112132503
Implantable Pulse Generator Implant Date: 20200526

## 2019-07-05 NOTE — Progress Notes (Signed)
ILR remote 

## 2019-07-06 LAB — CUP PACEART REMOTE DEVICE CHECK
Date Time Interrogation Session: 20210112000500
Implantable Pulse Generator Implant Date: 20200526

## 2019-07-08 ENCOUNTER — Telehealth: Payer: Self-pay | Admitting: *Deleted

## 2019-07-08 NOTE — Telephone Encounter (Signed)
The pt returning [place] phone call.

## 2019-07-08 NOTE — Telephone Encounter (Signed)
Spoke with patient. Pt's daughter currently has COVID and drives her to appointment, so patient requests reprogramming appointment in February. Scheduled for 08/04/19 at 4:00pm. No additional concerns at this time.

## 2019-07-08 NOTE — Telephone Encounter (Signed)
Frequent LINQ alerts for "AF" episodes, ECGs show SR w/ectopy. Needs DC appointment for [facility] reprogramming.  LMOVM requesting call back to DC. Direct number provided.

## 2019-07-22 DIAGNOSIS — K7460 Unspecified cirrhosis of liver: Secondary | ICD-10-CM | POA: Diagnosis not present

## 2019-07-22 DIAGNOSIS — J449 Chronic obstructive pulmonary disease, unspecified: Secondary | ICD-10-CM | POA: Diagnosis not present

## 2019-07-22 DIAGNOSIS — K754 Autoimmune hepatitis: Secondary | ICD-10-CM | POA: Diagnosis not present

## 2019-07-22 DIAGNOSIS — I69359 Hemiplegia and hemiparesis following cerebral infarction affecting unspecified side: Secondary | ICD-10-CM | POA: Diagnosis not present

## 2019-07-26 ENCOUNTER — Telehealth: Payer: Self-pay

## 2019-07-26 NOTE — Telephone Encounter (Signed)
I spoke with the pt to let her know per Raquel Sarna, Rn the transmission was reviewed and it showed False A-Fib. I told her it was nothing to do right now and the nurse will discuss it more with her on her 08-04-2019 appointment. The pt asked how do they know the difference between real a-fib and false a-fib? I told her they look at the transmissions and based on the particular patterns they can tell whether the A-fib is real or not. The pt verbalized understanding and thanked me for the call.

## 2019-07-26 NOTE — Telephone Encounter (Signed)
Manual transmission reviewed. All available AF episodes false, ECGs show SR w/ectopy and undersensing. Will adjust detection at DC appointment on 08/04/19.  LMOVM requesting call back to DC to provide update. Direct number provided.

## 2019-07-26 NOTE — Telephone Encounter (Signed)
LMOVM advising of upcoming appointment in DC on 08/04/19. Advised to call back if any questions in the meantime. Direct DC phone number provided.

## 2019-07-26 NOTE — Telephone Encounter (Signed)
Pt was returning [place] phone call. She left a message on the voicemail.

## 2019-07-26 NOTE — Telephone Encounter (Signed)
I spoke with the pt and help her send a transmission per [employer]. I told the pt the nurse will review it and give her a call back.  She asked at her appointment will they be taking the loop out. I told her no. I let her know she will be reprogrammed. She will not be cut. The pt thanked me for my help and verbalized understanding.

## 2019-07-27 DIAGNOSIS — Z23 Encounter for immunization: Secondary | ICD-10-CM | POA: Diagnosis not present

## 2019-07-28 DIAGNOSIS — Z6822 Body mass index (BMI) 22.0-22.9, adult: Secondary | ICD-10-CM | POA: Diagnosis not present

## 2019-07-28 DIAGNOSIS — F339 Major depressive disorder, recurrent, unspecified: Secondary | ICD-10-CM | POA: Diagnosis not present

## 2019-07-28 DIAGNOSIS — D509 Iron deficiency anemia, unspecified: Secondary | ICD-10-CM | POA: Diagnosis not present

## 2019-07-28 DIAGNOSIS — J449 Chronic obstructive pulmonary disease, unspecified: Secondary | ICD-10-CM | POA: Diagnosis not present

## 2019-08-04 ENCOUNTER — Ambulatory Visit (INDEPENDENT_AMBULATORY_CARE_PROVIDER_SITE_OTHER): Payer: Medicare Other | Admitting: *Deleted

## 2019-08-04 ENCOUNTER — Other Ambulatory Visit: Payer: Self-pay

## 2019-08-04 DIAGNOSIS — I63119 Cerebral infarction due to embolism of unspecified vertebral artery: Secondary | ICD-10-CM

## 2019-08-04 LAB — CUP PACEART INCLINIC DEVICE CHECK
Date Time Interrogation Session: 20210211161221
Implantable Pulse Generator Implant Date: 20200526

## 2019-08-04 NOTE — Progress Notes (Signed)
Loop check in clinic. Battery status: good. R-waves 0.91 mV. 0 symptom episodes, 2 tachy episodes (old, appear over sensing), 0 pause episodes, 0 brady episodes. 57 AF episodes, as previous, reviewed episodes appear NSR with ectopy, (<0.1% burden). Monthly summary reports and ROV with Dr. Rayann Heman prn. Changed parameters to less sensitive, >6 minutes.

## 2019-08-08 ENCOUNTER — Ambulatory Visit (INDEPENDENT_AMBULATORY_CARE_PROVIDER_SITE_OTHER): Payer: Medicare Other | Admitting: *Deleted

## 2019-08-08 DIAGNOSIS — R269 Unspecified abnormalities of gait and mobility: Secondary | ICD-10-CM | POA: Diagnosis not present

## 2019-08-08 DIAGNOSIS — I69398 Other sequelae of cerebral infarction: Secondary | ICD-10-CM | POA: Diagnosis not present

## 2019-08-08 LAB — CUP PACEART REMOTE DEVICE CHECK
Date Time Interrogation Session: 20210214235639
Implantable Pulse Generator Implant Date: 20200526

## 2019-08-09 NOTE — Progress Notes (Signed)
ILR Remote 

## 2019-08-17 DIAGNOSIS — D509 Iron deficiency anemia, unspecified: Secondary | ICD-10-CM | POA: Diagnosis not present

## 2019-08-17 DIAGNOSIS — Z6823 Body mass index (BMI) 23.0-23.9, adult: Secondary | ICD-10-CM | POA: Diagnosis not present

## 2019-08-18 DIAGNOSIS — D649 Anemia, unspecified: Secondary | ICD-10-CM | POA: Diagnosis not present

## 2019-08-19 DIAGNOSIS — D649 Anemia, unspecified: Secondary | ICD-10-CM | POA: Diagnosis not present

## 2019-08-21 DIAGNOSIS — F339 Major depressive disorder, recurrent, unspecified: Secondary | ICD-10-CM | POA: Diagnosis not present

## 2019-08-21 DIAGNOSIS — J449 Chronic obstructive pulmonary disease, unspecified: Secondary | ICD-10-CM | POA: Diagnosis not present

## 2019-08-21 DIAGNOSIS — K7460 Unspecified cirrhosis of liver: Secondary | ICD-10-CM | POA: Diagnosis not present

## 2019-08-21 DIAGNOSIS — I69359 Hemiplegia and hemiparesis following cerebral infarction affecting unspecified side: Secondary | ICD-10-CM | POA: Diagnosis not present

## 2019-08-24 DIAGNOSIS — Z23 Encounter for immunization: Secondary | ICD-10-CM | POA: Diagnosis not present

## 2019-08-25 DIAGNOSIS — M5431 Sciatica, right side: Secondary | ICD-10-CM | POA: Diagnosis not present

## 2019-08-25 DIAGNOSIS — Z6823 Body mass index (BMI) 23.0-23.9, adult: Secondary | ICD-10-CM | POA: Diagnosis not present

## 2019-08-25 DIAGNOSIS — D509 Iron deficiency anemia, unspecified: Secondary | ICD-10-CM | POA: Diagnosis not present

## 2019-08-31 DIAGNOSIS — D509 Iron deficiency anemia, unspecified: Secondary | ICD-10-CM | POA: Diagnosis not present

## 2019-09-05 ENCOUNTER — Ambulatory Visit (INDEPENDENT_AMBULATORY_CARE_PROVIDER_SITE_OTHER): Payer: Medicare Other | Admitting: *Deleted

## 2019-09-05 DIAGNOSIS — I69398 Other sequelae of cerebral infarction: Secondary | ICD-10-CM

## 2019-09-05 DIAGNOSIS — R269 Unspecified abnormalities of gait and mobility: Secondary | ICD-10-CM

## 2019-09-06 DIAGNOSIS — Z6823 Body mass index (BMI) 23.0-23.9, adult: Secondary | ICD-10-CM | POA: Diagnosis not present

## 2019-09-06 DIAGNOSIS — D509 Iron deficiency anemia, unspecified: Secondary | ICD-10-CM | POA: Diagnosis not present

## 2019-09-07 DIAGNOSIS — D509 Iron deficiency anemia, unspecified: Secondary | ICD-10-CM | POA: Diagnosis not present

## 2019-09-08 LAB — CUP PACEART REMOTE DEVICE CHECK
Date Time Interrogation Session: 20210318013337
Implantable Pulse Generator Implant Date: 20200526

## 2019-09-15 DIAGNOSIS — D509 Iron deficiency anemia, unspecified: Secondary | ICD-10-CM | POA: Diagnosis not present

## 2019-09-15 DIAGNOSIS — Z6823 Body mass index (BMI) 23.0-23.9, adult: Secondary | ICD-10-CM | POA: Diagnosis not present

## 2019-09-15 DIAGNOSIS — J449 Chronic obstructive pulmonary disease, unspecified: Secondary | ICD-10-CM | POA: Diagnosis not present

## 2019-09-21 DIAGNOSIS — I69359 Hemiplegia and hemiparesis following cerebral infarction affecting unspecified side: Secondary | ICD-10-CM | POA: Diagnosis not present

## 2019-09-21 DIAGNOSIS — J449 Chronic obstructive pulmonary disease, unspecified: Secondary | ICD-10-CM | POA: Diagnosis not present

## 2019-09-21 DIAGNOSIS — F339 Major depressive disorder, recurrent, unspecified: Secondary | ICD-10-CM | POA: Diagnosis not present

## 2019-09-21 DIAGNOSIS — K7460 Unspecified cirrhosis of liver: Secondary | ICD-10-CM | POA: Diagnosis not present

## 2019-09-29 DIAGNOSIS — K644 Residual hemorrhoidal skin tags: Secondary | ICD-10-CM | POA: Diagnosis not present

## 2019-09-29 DIAGNOSIS — J449 Chronic obstructive pulmonary disease, unspecified: Secondary | ICD-10-CM | POA: Diagnosis not present

## 2019-09-29 DIAGNOSIS — D509 Iron deficiency anemia, unspecified: Secondary | ICD-10-CM | POA: Diagnosis not present

## 2019-09-29 DIAGNOSIS — Z6822 Body mass index (BMI) 22.0-22.9, adult: Secondary | ICD-10-CM | POA: Diagnosis not present

## 2019-10-06 ENCOUNTER — Ambulatory Visit (INDEPENDENT_AMBULATORY_CARE_PROVIDER_SITE_OTHER): Payer: Medicare Other | Admitting: *Deleted

## 2019-10-06 DIAGNOSIS — I69398 Other sequelae of cerebral infarction: Secondary | ICD-10-CM

## 2019-10-06 DIAGNOSIS — R269 Unspecified abnormalities of gait and mobility: Secondary | ICD-10-CM

## 2019-10-09 LAB — CUP PACEART REMOTE DEVICE CHECK
Date Time Interrogation Session: 20210418015615
Implantable Pulse Generator Implant Date: 20200526

## 2019-10-10 NOTE — Progress Notes (Signed)
ILR Remote 

## 2019-10-21 DIAGNOSIS — J449 Chronic obstructive pulmonary disease, unspecified: Secondary | ICD-10-CM | POA: Diagnosis not present

## 2019-10-21 DIAGNOSIS — K7460 Unspecified cirrhosis of liver: Secondary | ICD-10-CM | POA: Diagnosis not present

## 2019-10-21 DIAGNOSIS — F339 Major depressive disorder, recurrent, unspecified: Secondary | ICD-10-CM | POA: Diagnosis not present

## 2019-10-21 DIAGNOSIS — I69359 Hemiplegia and hemiparesis following cerebral infarction affecting unspecified side: Secondary | ICD-10-CM | POA: Diagnosis not present

## 2019-10-27 DIAGNOSIS — D509 Iron deficiency anemia, unspecified: Secondary | ICD-10-CM | POA: Diagnosis not present

## 2019-11-02 DIAGNOSIS — D509 Iron deficiency anemia, unspecified: Secondary | ICD-10-CM | POA: Diagnosis not present

## 2019-11-07 ENCOUNTER — Ambulatory Visit (INDEPENDENT_AMBULATORY_CARE_PROVIDER_SITE_OTHER): Payer: Medicare Other | Admitting: *Deleted

## 2019-11-07 DIAGNOSIS — I63119 Cerebral infarction due to embolism of unspecified vertebral artery: Secondary | ICD-10-CM | POA: Diagnosis not present

## 2019-11-09 DIAGNOSIS — D509 Iron deficiency anemia, unspecified: Secondary | ICD-10-CM | POA: Diagnosis not present

## 2019-11-09 LAB — CUP PACEART REMOTE DEVICE CHECK
Date Time Interrogation Session: 20210519015930
Implantable Pulse Generator Implant Date: 20200526

## 2019-11-09 NOTE — Progress Notes (Signed)
Carelink Summary Report / Loop Recorder 

## 2019-11-21 DIAGNOSIS — J449 Chronic obstructive pulmonary disease, unspecified: Secondary | ICD-10-CM | POA: Diagnosis not present

## 2019-11-21 DIAGNOSIS — K7460 Unspecified cirrhosis of liver: Secondary | ICD-10-CM | POA: Diagnosis not present

## 2019-11-21 DIAGNOSIS — F339 Major depressive disorder, recurrent, unspecified: Secondary | ICD-10-CM | POA: Diagnosis not present

## 2019-11-21 DIAGNOSIS — I69359 Hemiplegia and hemiparesis following cerebral infarction affecting unspecified side: Secondary | ICD-10-CM | POA: Diagnosis not present

## 2019-11-24 ENCOUNTER — Other Ambulatory Visit: Payer: Self-pay

## 2019-11-24 ENCOUNTER — Encounter: Payer: Self-pay | Admitting: Adult Health

## 2019-11-24 ENCOUNTER — Ambulatory Visit (INDEPENDENT_AMBULATORY_CARE_PROVIDER_SITE_OTHER): Payer: Medicare Other | Admitting: Adult Health

## 2019-11-24 VITALS — BP 137/72 | HR 70 | Wt 129.0 lb

## 2019-11-24 DIAGNOSIS — I10 Essential (primary) hypertension: Secondary | ICD-10-CM | POA: Diagnosis not present

## 2019-11-24 DIAGNOSIS — I639 Cerebral infarction, unspecified: Secondary | ICD-10-CM

## 2019-11-24 DIAGNOSIS — D649 Anemia, unspecified: Secondary | ICD-10-CM

## 2019-11-24 NOTE — Progress Notes (Signed)
[facility] [address] [place]. [place] [address] [phone]       OFFICE FOLLOW-UP NOTE  [name] Date of Birth:  [birth year] Medical Record Number:  [id number]    Chief complaint: Chief Complaint  Patient presents with  . Follow-up    stroke fu, rm 9,   . Cerebrovascular Accident    left sided dysmetria and imbalance - denies worsening      HPI:  Today, 11/24/2019, [name] returns for follow-up regarding left cerebellar and bilateral parietal and right frontal stroke in 10/2018.  She has been stable from a stroke standpoint with residual left-sided dysmetria. Denies new stroke/TIA symptoms.  Continues on aspirin 325 mg daily for secondary stroke prevention.  Blood pressure today 137/72.  Loop recorder has not shown atrial fibrillation thus far.   Over the past month, she hasn't been feeling well and sleeping a lot. History of anemia requiring iron infusions with recent infusion on 11/02/2019. She hasn't seen much benefit. She reports increased imbalance when she feels this way. Past few days improving. She has not had follow up again with PCP yet.  She also endorses arthritic knee pain which has been limiting mobility.  She has not had follow-up with orthopedics.      History provided for reference purposes only Update 05/25/2019 JM: [name] is an 84 year old female who is being seen today for stroke follow-up.  Residual deficits of mild left-sided dysmetria and imbalance.  She does report great improvement but unfortunately since prior visit she broke her left hip after a fall and had subsequent partial hip replacement.  She does endorse that lack of recovery due to hip.  She is able to ambulate without cane or rolling walker.  She does report occasional short-term memory concerns such as forgetting where she placed an item.  She has returned to living on her home maintaining all ADLs and IADLs without difficulty.  She has continued on aspirin 325  mg daily without bleeding or bruising.  Blood pressure today 140/70.  Loop recorder has not shown atrial fibrillation thus far.  Denies new or worsening stroke/TIA symptoms.  Initial visit 01/04/2019 Dr. Leonie Man: [name] is a 44 year old pleasant Caucasian lady seen today for office first follow-up visit following hospital admission for stroke in May 2020.  She is accompanied by her daughter.  History is obtained from them, review of electronic medical records and I personally reviewed imaging films in PACS. Stephanie Smithis a 75 y.o.femalepast medical history of COPD, hypertension, hypothyroidism,GERDresents to the emergency room at [facility] with sudden onset dizzinessthat began around 6:30 PM on 11/14/18.She had nausea and vomited multiple times. She went to [facility] and was evaluated by tele neurology.On assessment,noted to have disconjugate gaze, dysarthria and left-sided ataxia.Risk versus benefits was discussed with patient and she is received IV TPA (start time was 9.22pm). Herblood pressure remained below 99991111 systolic per reports.She was transferred to [facility] for further management. Stroke work up completed, inc Loop monitor placed for long term ongoing cardiac monitoring in setting of cryptogenic stroke. CT angiogram of the brain and neck were obtained at [facility] which showed no large vessel stenosis or occlusion.  MRI scan of the brain showed acute left superior cerebellar artery infarct as well as small acute bilateral parietal and right frontal infarcts.  New small volume right parietal subarachnoid hemorrhage and small right subdural hematoma without mass-effect.  2D echo showed normal ejection fraction without cardiac source of embolism.  LDL  cholesterol 65 mg percent and hemoglobin A1c was 5.9.  Patient was seen by physical occupational speech therapy and felt to be good patient for inpatient rehab.  She had mild left-sided dysmetria at the time of  discharge.  Patient states she is done well.  She is presently at home living with her daughter.  She feels incoordination and dizziness have improved.  She is getting home physical and occupational therapy and making good steady progress.  She can walk indoors short distances by herself and uses a walker for long distances.  She has not started going to her own apartment during the daytime but spends the night in the daughter's home with her.  She is thinking about moving into her apartment full-time over the next few weeks.  She still feels her balance is not back to baseline and at times she has trouble controlling her left side.  She is tolerating aspirin well without bruising or bleeding.  She states her blood pressures well controlled today it is 129/72.  She has no new complaints.     ROS:   14 system review of systems is positive for gait imbalance, hip pain and all other systems negative  PMH:  Past Medical History:  Diagnosis Date  . Anemia   . Arthritis   . COPD (chronic obstructive pulmonary disease) ([place])   . Diverticulosis   . GERD (gastroesophageal reflux disease)   . Hepatitis A virus infection 1963  . Hypertension   . Hypothyroid   . Microhematuria   . NASH (nonalcoholic steatohepatitis)   . Osteoarthritis   . Stroke ([place]) 11/13/2018  . UTI (urinary tract infection)     Social History:  Social History   Socioeconomic History  . Marital status: Widowed    Spouse name: Not on file  . Number of children: Not on file  . Years of education: Not on file  . Highest education level: Not on file  Occupational History  . Not on file  Tobacco Use  . Smoking status: Former Smoker    Types: Cigarettes    Quit date: 11/16/1981    Years since quitting: 38.0  . Smokeless tobacco: Never Used  Substance and Sexual Activity  . Alcohol use: Not Currently  . Drug use: Never  . Sexual activity: Not Currently  Other Topics Concern  . Not on file  Social History Narrative  .  Not on file   Social Determinants of Health   Financial Resource Strain:   . Difficulty of Paying Living Expenses:   Food Insecurity:   . Worried About Charity fundraiser in the Last Year:   . Arboriculturist in the Last Year:   Transportation Needs:   . Film/video editor (Medical):   Marland Kitchen Lack of Transportation (Non-Medical):   Physical Activity:   . Days of Exercise per Week:   . Minutes of Exercise per Session:   Stress:   . Feeling of Stress :   Social Connections:   . Frequency of Communication with Friends and Family:   . Frequency of Social Gatherings with Friends and Family:   . Attends Religious Services:   . Active Member of Clubs or Organizations:   . Attends Archivist Meetings:   Marland Kitchen Marital Status:   Intimate Partner Violence:   . Fear of Current or Ex-Partner:   . Emotionally Abused:   Marland Kitchen Physically Abused:   . Sexually Abused:     Medications:   Current Outpatient Medications  on File Prior to Visit  Medication Sig Dispense Refill  . aspirin EC 325 MG EC tablet Take 1 tablet (325 mg total) by mouth daily. 30 tablet 0  . cholecalciferol (VITAMIN D3) 25 MCG (1000 UT) tablet Take 1,000 Units by mouth daily.    Marland Kitchen escitalopram (LEXAPRO) 10 MG tablet Take 10 mg by mouth daily.    . furosemide (LASIX) 20 MG tablet     . lansoprazole (PREVACID) 15 MG capsule Take 15 mg by mouth daily at 12 noon.    Marland Kitchen levothyroxine (SYNTHROID, LEVOTHROID) 112 MCG tablet Take 112 mcg by mouth daily before breakfast.    . metoprolol succinate (TOPROL-XL) 25 MG 24 hr tablet Take 1 tablet (25 mg total) by mouth daily. 30 tablet 1  . roflumilast (DALIRESP) 500 MCG TABS tablet Take 500 mcg by mouth daily.     Viviana Simpler ELLIPTA 200-62.5-25 MCG/INH AEPB      No current facility-administered medications on file prior to visit.    Allergies:   Allergies  Allergen Reactions  . Penicillins Anaphylaxis    Did it involve swelling of the face/tongue/throat, SOB, or low BP? yes Did  it involve sudden or severe rash/hives, skin peeling, or any reaction on the inside of your mouth or nose? no Did you need to seek medical attention at a hospital or doctor's office? yes When did it last happen?pt was 20 If all above answers are "NO", may proceed with cephalosporin use.   . Codeine     hyper    Physical Exam  Today's Vitals   11/24/19 0830  BP: 137/72  Pulse: 70  Weight: 129 lb (58.5 kg)   Body mass index is 23.59 kg/m.   General: Very pleasant elderly Caucasian lady seated, in no evident distress Head: head normocephalic and atraumatic.  Neck: supple with no carotid or supraclavicular bruits Cardiovascular: regular rate and rhythm, no murmurs Musculoskeletal: no deformity except mild kyphoscoliosis and decreased left hip ROM Skin:  no rash/petichiae Vascular:  Normal pulses all extremities  Neurologic Exam Mental Status: Awake and fully alert. Oriented to place and time. Recent memory slightly diminished and remote memory intact. Attention span, concentration and fund of knowledge appropriate. Mood and affect appropriate.  Cranial Nerves: Pupils equal, briskly reactive to light. Extraocular movements full without nystagmus. Visual fields full to confrontation. Hearing intact. Facial sensation intact. Face, tongue, palate moves normally and symmetrically.  Motor: Normal bulk and tone. Normal strength in all tested extremity muscles. Sensory.: intact to touch ,pinprick .position and vibratory sensation.  Coordination: Rapid alternating movements normal in all extremities except decreased left hand dexterity. Finger-to-nose and heel-to-shin evident for dysmetria on left side and normal on right side. Gait and Station: Arises from chair without difficulty. Stance is slightly stooped. Gait demonstrates short slow steps and slight imbalance with use of cane.  Unable to perform tandem walking. Reflexes: 1+ and symmetric. Toes downgoing.      ASSESSMENT:  84 year old Caucasian lady with embolic bicerebral strokes of cryptogenic etiology in May 2020 treated with IV TPA with residual left-sided dysmetria.  Loop recorder placed which has not shown atrial fibrillation thus far.  Vascular risk factors of hypertension and age only.  She unfortunately suffered a fall in 01/2019 with resultant left hip fracture and had hip replacement.  Recently received iron infusion for history of iron deficiency anemia and continues to experience fatigue.  Also complains of right knee arthritic pain which has been interfering with gait     PLAN: -  Continue aspirin 325 mg daily for secondary stroke prevention  -Continue to follow with PCP for HTN management -Advised to follow-up with PCP in regards for ongoing fatigue and recent iron infusion -Advised to follow-up with orthopedics in regards to arthritic knee pain and possible benefit of injection -Continue to monitor loop recorder -maintain strict control of hypertension with blood pressure goal below 130/90, diabetes with hemoglobin A1c goal below 6.5% and lipids with LDL cholesterol goal below 70 mg/dL. I also advised the patient to eat a healthy diet with plenty of whole grains, cereals, fruits and vegetables, exercise regularly and maintain ideal body weight.    As she has been stable from a stroke standpoint, recommend follow-up as needed  I spent 29 minutes of face-to-face and non-face-to-face time with patient.  This included previsit chart review, lab review, study review, order entry, electronic health record documentation, patient education   Frann Rider, [facility]  [facility] [address] [place], [place] [address]  Phone [phone] Fax [phone] Note: This document was prepared with digital dictation and possible smart phrase technology. Any transcriptional errors that result from this process are unintentional.

## 2019-11-24 NOTE — Patient Instructions (Addendum)
Please follow up with your PCP regarding ongoing fatigue and lack of energy for repeat lab work Follow up with orthopedics regarding knee pain and possible injections  Continue aspirin 325 mg daily  for secondary stroke prevention  Continue to follow up with PCP regarding cholesterol and blood pressure management   Continue to monitor blood pressure at home  Maintain strict control of hypertension with blood pressure goal below 130/90, diabetes with hemoglobin A1c goal below 6.5% and cholesterol with LDL cholesterol (bad cholesterol) goal below 70 mg/dL. I also advised the patient to eat a healthy diet with plenty of whole grains, cereals, fruits and vegetables, exercise regularly and maintain ideal body weight.  As you have been stable from a stroke standpoint, please follow up as needed       Thank you for coming to see [place] at [facility]. I hope we have been able to provide you high quality care today.  You may receive a patient satisfaction survey over the next few weeks. We would appreciate your feedback and comments so that we may continue to improve ourselves and the health of our patients.

## 2019-11-25 NOTE — Progress Notes (Signed)
I agree with the above plan 

## 2019-12-12 ENCOUNTER — Ambulatory Visit (INDEPENDENT_AMBULATORY_CARE_PROVIDER_SITE_OTHER): Payer: Medicare Other | Admitting: *Deleted

## 2019-12-12 DIAGNOSIS — I639 Cerebral infarction, unspecified: Secondary | ICD-10-CM

## 2019-12-12 LAB — CUP PACEART REMOTE DEVICE CHECK
Date Time Interrogation Session: 20210621032348
Implantable Pulse Generator Implant Date: 20200526

## 2019-12-13 NOTE — Progress Notes (Signed)
Carelink Summary Report / Loop Recorder 

## 2020-01-16 ENCOUNTER — Ambulatory Visit: Payer: Medicare Other | Admitting: *Deleted

## 2020-01-16 DIAGNOSIS — I639 Cerebral infarction, unspecified: Secondary | ICD-10-CM

## 2020-01-17 LAB — CUP PACEART REMOTE DEVICE CHECK
Date Time Interrogation Session: 20210725233355
Implantable Pulse Generator Implant Date: 20200526

## 2020-01-19 NOTE — Progress Notes (Signed)
Carelink Summary Report / Loop Recorder 

## 2020-01-22 DIAGNOSIS — I69359 Hemiplegia and hemiparesis following cerebral infarction affecting unspecified side: Secondary | ICD-10-CM | POA: Diagnosis not present

## 2020-01-22 DIAGNOSIS — F339 Major depressive disorder, recurrent, unspecified: Secondary | ICD-10-CM | POA: Diagnosis not present

## 2020-01-22 DIAGNOSIS — K7460 Unspecified cirrhosis of liver: Secondary | ICD-10-CM | POA: Diagnosis not present

## 2020-01-22 DIAGNOSIS — J449 Chronic obstructive pulmonary disease, unspecified: Secondary | ICD-10-CM | POA: Diagnosis not present

## 2020-01-30 DIAGNOSIS — D509 Iron deficiency anemia, unspecified: Secondary | ICD-10-CM | POA: Diagnosis not present

## 2020-01-30 DIAGNOSIS — Z6823 Body mass index (BMI) 23.0-23.9, adult: Secondary | ICD-10-CM | POA: Diagnosis not present

## 2020-01-30 DIAGNOSIS — Z1331 Encounter for screening for depression: Secondary | ICD-10-CM | POA: Diagnosis not present

## 2020-01-30 DIAGNOSIS — Z1339 Encounter for screening examination for other mental health and behavioral disorders: Secondary | ICD-10-CM | POA: Diagnosis not present

## 2020-01-30 DIAGNOSIS — Z0000 Encounter for general adult medical examination without abnormal findings: Secondary | ICD-10-CM | POA: Diagnosis not present

## 2020-01-30 DIAGNOSIS — Z139 Encounter for screening, unspecified: Secondary | ICD-10-CM | POA: Diagnosis not present

## 2020-01-30 DIAGNOSIS — Z9181 History of falling: Secondary | ICD-10-CM | POA: Diagnosis not present

## 2020-01-31 DIAGNOSIS — D509 Iron deficiency anemia, unspecified: Secondary | ICD-10-CM | POA: Diagnosis not present

## 2020-02-06 DIAGNOSIS — D509 Iron deficiency anemia, unspecified: Secondary | ICD-10-CM | POA: Diagnosis not present

## 2020-02-06 DIAGNOSIS — Z6823 Body mass index (BMI) 23.0-23.9, adult: Secondary | ICD-10-CM | POA: Diagnosis not present

## 2020-02-06 DIAGNOSIS — K7460 Unspecified cirrhosis of liver: Secondary | ICD-10-CM | POA: Diagnosis not present

## 2020-02-08 DIAGNOSIS — D509 Iron deficiency anemia, unspecified: Secondary | ICD-10-CM | POA: Diagnosis not present

## 2020-02-15 DIAGNOSIS — D509 Iron deficiency anemia, unspecified: Secondary | ICD-10-CM | POA: Diagnosis not present

## 2020-02-20 ENCOUNTER — Ambulatory Visit (INDEPENDENT_AMBULATORY_CARE_PROVIDER_SITE_OTHER): Payer: Medicare Other | Admitting: *Deleted

## 2020-02-20 DIAGNOSIS — I63119 Cerebral infarction due to embolism of unspecified vertebral artery: Secondary | ICD-10-CM | POA: Diagnosis not present

## 2020-02-21 DIAGNOSIS — J449 Chronic obstructive pulmonary disease, unspecified: Secondary | ICD-10-CM | POA: Diagnosis not present

## 2020-02-21 DIAGNOSIS — F339 Major depressive disorder, recurrent, unspecified: Secondary | ICD-10-CM | POA: Diagnosis not present

## 2020-02-21 DIAGNOSIS — I69359 Hemiplegia and hemiparesis following cerebral infarction affecting unspecified side: Secondary | ICD-10-CM | POA: Diagnosis not present

## 2020-02-21 LAB — CUP PACEART REMOTE DEVICE CHECK
Date Time Interrogation Session: 20210827235056
Implantable Pulse Generator Implant Date: 20200526

## 2020-02-22 NOTE — Progress Notes (Signed)
Carelink Summary Report / Loop Recorder 

## 2020-03-05 DIAGNOSIS — Z23 Encounter for immunization: Secondary | ICD-10-CM | POA: Diagnosis not present

## 2020-03-05 DIAGNOSIS — D509 Iron deficiency anemia, unspecified: Secondary | ICD-10-CM | POA: Diagnosis not present

## 2020-03-05 DIAGNOSIS — D692 Other nonthrombocytopenic purpura: Secondary | ICD-10-CM | POA: Diagnosis not present

## 2020-03-05 DIAGNOSIS — F339 Major depressive disorder, recurrent, unspecified: Secondary | ICD-10-CM | POA: Diagnosis not present

## 2020-03-23 DIAGNOSIS — I69359 Hemiplegia and hemiparesis following cerebral infarction affecting unspecified side: Secondary | ICD-10-CM | POA: Diagnosis not present

## 2020-03-23 DIAGNOSIS — J449 Chronic obstructive pulmonary disease, unspecified: Secondary | ICD-10-CM | POA: Diagnosis not present

## 2020-03-23 DIAGNOSIS — F339 Major depressive disorder, recurrent, unspecified: Secondary | ICD-10-CM | POA: Diagnosis not present

## 2020-03-23 DIAGNOSIS — K7460 Unspecified cirrhosis of liver: Secondary | ICD-10-CM | POA: Diagnosis not present

## 2020-03-26 ENCOUNTER — Ambulatory Visit (INDEPENDENT_AMBULATORY_CARE_PROVIDER_SITE_OTHER): Payer: Medicare Other

## 2020-03-26 DIAGNOSIS — D509 Iron deficiency anemia, unspecified: Secondary | ICD-10-CM | POA: Diagnosis not present

## 2020-03-26 DIAGNOSIS — I63119 Cerebral infarction due to embolism of unspecified vertebral artery: Secondary | ICD-10-CM | POA: Diagnosis not present

## 2020-03-26 DIAGNOSIS — J449 Chronic obstructive pulmonary disease, unspecified: Secondary | ICD-10-CM | POA: Diagnosis not present

## 2020-03-26 DIAGNOSIS — I5083 High output heart failure: Secondary | ICD-10-CM | POA: Diagnosis not present

## 2020-03-26 DIAGNOSIS — K921 Melena: Secondary | ICD-10-CM | POA: Diagnosis not present

## 2020-03-26 LAB — CUP PACEART REMOTE DEVICE CHECK
Date Time Interrogation Session: 20210929235133
Implantable Pulse Generator Implant Date: 20200526

## 2020-03-28 NOTE — Progress Notes (Signed)
Carelink Summary Report / Loop Recorder 

## 2020-03-30 DIAGNOSIS — D509 Iron deficiency anemia, unspecified: Secondary | ICD-10-CM | POA: Diagnosis not present

## 2020-04-16 DIAGNOSIS — M1712 Unilateral primary osteoarthritis, left knee: Secondary | ICD-10-CM | POA: Diagnosis not present

## 2020-04-16 DIAGNOSIS — M1711 Unilateral primary osteoarthritis, right knee: Secondary | ICD-10-CM | POA: Diagnosis not present

## 2020-04-19 DIAGNOSIS — I69359 Hemiplegia and hemiparesis following cerebral infarction affecting unspecified side: Secondary | ICD-10-CM | POA: Diagnosis not present

## 2020-04-19 DIAGNOSIS — S8010XA Contusion of unspecified lower leg, initial encounter: Secondary | ICD-10-CM | POA: Diagnosis not present

## 2020-04-19 DIAGNOSIS — Z139 Encounter for screening, unspecified: Secondary | ICD-10-CM | POA: Diagnosis not present

## 2020-04-19 DIAGNOSIS — D509 Iron deficiency anemia, unspecified: Secondary | ICD-10-CM | POA: Diagnosis not present

## 2020-04-19 DIAGNOSIS — K754 Autoimmune hepatitis: Secondary | ICD-10-CM | POA: Diagnosis not present

## 2020-04-20 DIAGNOSIS — Z23 Encounter for immunization: Secondary | ICD-10-CM | POA: Diagnosis not present

## 2020-04-23 DIAGNOSIS — F339 Major depressive disorder, recurrent, unspecified: Secondary | ICD-10-CM | POA: Diagnosis not present

## 2020-04-23 DIAGNOSIS — I69359 Hemiplegia and hemiparesis following cerebral infarction affecting unspecified side: Secondary | ICD-10-CM | POA: Diagnosis not present

## 2020-04-23 DIAGNOSIS — J449 Chronic obstructive pulmonary disease, unspecified: Secondary | ICD-10-CM | POA: Diagnosis not present

## 2020-04-23 DIAGNOSIS — I5083 High output heart failure: Secondary | ICD-10-CM | POA: Diagnosis not present

## 2020-04-27 DIAGNOSIS — D509 Iron deficiency anemia, unspecified: Secondary | ICD-10-CM | POA: Diagnosis not present

## 2020-04-27 LAB — CUP PACEART REMOTE DEVICE CHECK
Date Time Interrogation Session: 20211101235455
Implantable Pulse Generator Implant Date: 20200526

## 2020-04-30 ENCOUNTER — Ambulatory Visit (INDEPENDENT_AMBULATORY_CARE_PROVIDER_SITE_OTHER): Payer: Medicare Other

## 2020-04-30 DIAGNOSIS — I63119 Cerebral infarction due to embolism of unspecified vertebral artery: Secondary | ICD-10-CM | POA: Diagnosis not present

## 2020-05-01 NOTE — Progress Notes (Signed)
Carelink Summary Report / Loop Recorder 

## 2020-05-10 DIAGNOSIS — I471 Supraventricular tachycardia: Secondary | ICD-10-CM | POA: Diagnosis not present

## 2020-05-10 DIAGNOSIS — D509 Iron deficiency anemia, unspecified: Secondary | ICD-10-CM | POA: Diagnosis not present

## 2020-05-10 DIAGNOSIS — I110 Hypertensive heart disease with heart failure: Secondary | ICD-10-CM | POA: Diagnosis not present

## 2020-05-10 DIAGNOSIS — K921 Melena: Secondary | ICD-10-CM | POA: Diagnosis not present

## 2020-05-10 DIAGNOSIS — R5383 Other fatigue: Secondary | ICD-10-CM | POA: Diagnosis not present

## 2020-05-23 DIAGNOSIS — J449 Chronic obstructive pulmonary disease, unspecified: Secondary | ICD-10-CM | POA: Diagnosis not present

## 2020-05-23 DIAGNOSIS — I5083 High output heart failure: Secondary | ICD-10-CM | POA: Diagnosis not present

## 2020-05-23 DIAGNOSIS — I471 Supraventricular tachycardia: Secondary | ICD-10-CM | POA: Diagnosis not present

## 2020-05-23 DIAGNOSIS — I110 Hypertensive heart disease with heart failure: Secondary | ICD-10-CM | POA: Diagnosis not present

## 2020-06-03 LAB — CUP PACEART REMOTE DEVICE CHECK
Date Time Interrogation Session: 20211204225853
Implantable Pulse Generator Implant Date: 20200526

## 2020-06-04 ENCOUNTER — Ambulatory Visit (INDEPENDENT_AMBULATORY_CARE_PROVIDER_SITE_OTHER): Payer: Medicare Other

## 2020-06-04 DIAGNOSIS — I63119 Cerebral infarction due to embolism of unspecified vertebral artery: Secondary | ICD-10-CM | POA: Diagnosis not present

## 2020-06-08 ENCOUNTER — Encounter: Payer: Self-pay | Admitting: Gastroenterology

## 2020-06-08 ENCOUNTER — Ambulatory Visit (INDEPENDENT_AMBULATORY_CARE_PROVIDER_SITE_OTHER): Payer: Medicare Other | Admitting: Gastroenterology

## 2020-06-08 VITALS — BP 154/80 | HR 88 | Ht 62.0 in | Wt 140.1 lb

## 2020-06-08 DIAGNOSIS — K7469 Other cirrhosis of liver: Secondary | ICD-10-CM

## 2020-06-08 DIAGNOSIS — R161 Splenomegaly, not elsewhere classified: Secondary | ICD-10-CM | POA: Diagnosis not present

## 2020-06-08 DIAGNOSIS — K766 Portal hypertension: Secondary | ICD-10-CM

## 2020-06-08 DIAGNOSIS — K7290 Hepatic failure, unspecified without coma: Secondary | ICD-10-CM | POA: Diagnosis not present

## 2020-06-08 DIAGNOSIS — R188 Other ascites: Secondary | ICD-10-CM

## 2020-06-08 DIAGNOSIS — D696 Thrombocytopenia, unspecified: Secondary | ICD-10-CM

## 2020-06-08 DIAGNOSIS — K7682 Hepatic encephalopathy: Secondary | ICD-10-CM

## 2020-06-08 MED ORDER — FUROSEMIDE 20 MG PO TABS: 20.0000 mg | ORAL_TABLET | Freq: Every day | ORAL | 0 refills | Status: DC

## 2020-06-08 MED ORDER — LACTULOSE 10 GM/15ML PO SOLN: 10.0000 g | Freq: Two times a day (BID) | ORAL | 0 refills | Status: DC

## 2020-06-08 NOTE — Patient Instructions (Signed)
If you are age 82 or older, your body mass index should be between 23-30. Your Body mass index is 25.63 kg/m. If this is out of the aforementioned range listed, please consider follow up with your Primary Care Provider.  If you are age 17 or younger, your body mass index should be between 19-25. Your Body mass index is 25.63 kg/m. If this is out of the aformentioned range listed, please consider follow up with your Primary Care Provider.    We have sent the following medications to your pharmacy for you to pick up at your convenience:  Lactulose  Lasix 20mg   We will call you with your appointment for your ultrasound of the abdomen. Please take the lab requisitions to a [facility] for bloodwork.  Follow up in 12 weeks. Our scheduled for that time is not out as of yet. Please call to schedule.  Due to recent changes in healthcare laws, you may see the results of your imaging and laboratory studies on MyChart before your provider has had a chance to review them.  We understand that in some cases there may be results that are confusing or concerning to you. Not all laboratory results come back in the same time frame and the provider may be waiting for multiple results in order to interpret others.  Please give [place] 48 hours in order for your provider to thoroughly review all the results before contacting the office for clarification of your results.   It was a pleasure to see you today!  Jackquline Denmark, M.D.

## 2020-06-08 NOTE — Progress Notes (Signed)
Chief Complaint:   Referring Provider:  Marco Collie, MD      ASSESSMENT AND PLAN;   #1. 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  May have some autoimmune component.  Mild splenomegaly. No thrombocytopenia, coagulopathy, HE and jaundice.  #2.  Chronic constipation. - lactulose 10 g per 15 mL.  1 tablespoon p.o. twice daily.  Titrate to 2-3 bowel movements per day.  #3.  Peripheral edema but without any definite ascites. ?etology. ?CHF -Low-salt diet.   -Lasix 20 mg p.o. QD until ankle swelling is better, then QOD  #4.  Chronic IDA, Stephanie/P multiple iron infusions. Multifactorial d/t hypersplenism, decreased iron intake, slow GI bleeding related to portal hypertensive gastropathy/early GAVE, mild chronic renal insufficiency.  Had heme-neg stools in the past.  Neg colonoscopy Jan 2016 and CT AP 09/2016  #5. [place] screening  Plan: -Continue Pravacid 31m po qd -Cardio appt Dr RGeraldo Pitter RE; ?CHF -CBC, CMP, PT INR, AFP -[place] complete -FU in 12 weeks -Obtain records from Dr. HNyra Capesoffice esp last Labs -At FU, [facility]  She is much higher than baseline risk d/t advanced age and multiple comorbid conditions.  Cardiology clearance would help. -D/W patient and patient'Stephanie family.    HPI:    Stephanie YERBYis a 84y.o. female  With H/O CHF, COPD, chronic anemia, NASH cirrhosis, GERD, HLD, HTN, CVA, CKD2, Stephanie/P cholecystectomy 2013, vaginal hysterectomy  With nonalcoholic liver cirrhosis - Dx on CT Oct 2016 with longstanding H/O fatty liver. -Neg acute hepatitis panel, celiac screen, positive ANA, positive AMA (132), positive ASMA (81).  May have some autoimmune component.  Refused liver biopsy. -Stephanie/P vaccines for hepatitis a and B. -EGD 10/2015 did not show any esophageal varices.  However, had portal hypertensive gastropathy ?Early GAVE. -Negative colonoscopy January 2016 except for colonic polyps Stephanie/p polypectomy, moderate predominantly sigmoid  diverticulosis.  Highly redundant colon.  Internal hemorrhoids.  Bx- TAs.  Hold off on routine colon d/t age -CT AP 09/2016: Cirrhosis without ascites or splenomegaly. -[place]3/2018: Liver cirrhosis, borderline splenomegaly measuring 12 cm.  No focal lesions.  With chronic IDA-thought to be multifactorial d/t hypersplenism, decreased iron intake, slow GI bleeding related to portal hypertensive gastropathy, mild chronic renal insufficiency.  Had heme-negative stools in the past.  Neg colonoscopy Jan 2016 and CT AP 09/2016  Has been having increasing constipation.  No change in mental status.  Has history of easy bruisability and has been taken off aspirin which helped.  Heartburn is under good control with Prevacid.  She denies having any nausea, vomiting, hematemesis, melena, recent weight loss or loss of appetite.  Ankle has been swelling up.  She also gets short of breath while walking.  Has previous history of CHF.  She has not seen Dr. RGeraldo Pitterin many many years.  No sodas, chocolates, chewing gums, artificial sweeteners and candy. No NSAIDs  Past Medical History:  Diagnosis Date  . Anemia   . Arthritis   . Autoimmune hepatitis ([place]   . Congestive heart failure ([place]   . COPD (chronic obstructive pulmonary disease) ([place]   . Depression   . Diverticulosis   . GERD (gastroesophageal reflux disease)   . Hepatitis A virus infection 1963  . History of colon polyps   . History of colon polyps   . Hyperlipidemia   . Hypertension   . Hypothyroid   . Microhematuria   . NASH (nonalcoholic steatohepatitis)   . Neuropathy   .  Osteoarthritis   . Rectal prolapse   . Stage 2 chronic kidney disease   . Stroke ([place]) 11/13/2018  . UTI (urinary tract infection)     Past Surgical History:  Procedure Laterality Date  . ABDOMINAL HYSTERECTOMY  2012  . AMPUTATION TOE     right 2nd toe   . BREAST SURGERY     Breast tumor removed non-malignant  . BUNIONECTOMY  05/01/2016  . CHOLECYSTECTOMY   2013  . COLONOSCOPY  07/12/2014   Colonic polyps status post polypectomy. Moderate predominanlty sigmoid diverticulosis. High redundant colon.  . ESOPHAGOGASTRODUODENOSCOPY  11/14/2015   Mild Candida esophagitis. Mild gastritis. Incidental gastic polyps (status post polypectomy x2)  . LOOP RECORDER INSERTION N/A 11/16/2018   Procedure: LOOP RECORDER INSERTION;  Surgeon: Thompson Grayer, MD;  Location: [place];  Service: Cardiovascular;  Laterality: N/A;  . PARTIAL HIP ARTHROPLASTY Left 12/2018   [facility]  . vaginal lesion removed     Precancerous    Family History  Problem Relation Age of Onset  . Cancer Mother   . Diabetes Father   . Heart disease Father     Social History   Tobacco Use  . Smoking status: Former Smoker    Types: Cigarettes    Quit date: 11/16/1981    Years since quitting: 38.5  . Smokeless tobacco: Never Used  Vaping Use  . Vaping Use: Never used  Substance Use Topics  . Alcohol use: Not Currently  . Drug use: Never    Current Outpatient Medications  Medication Sig Dispense Refill  . alendronate (FOSAMAX) 70 MG tablet Take 70 mg by mouth once a week.    . budesonide (PULMICORT) 0.5 MG/2ML nebulizer solution Take 0.5 mg by nebulization 2 (two) times daily.    Marland Kitchen escitalopram (LEXAPRO) 10 MG tablet Take 10 mg by mouth daily.    . lansoprazole (PREVACID 24HR) 15 MG capsule Take 30 mg by mouth daily at 12 noon.    Marland Kitchen levothyroxine (SYNTHROID, LEVOTHROID) 112 MCG tablet Take 112 mcg by mouth daily before breakfast.    . metoprolol succinate (TOPROL-XL) 25 MG 24 hr tablet Take 1 tablet (25 mg total) by mouth daily. 30 tablet 1  . revefenacin (YUPELRI) 175 MCG/3ML nebulizer solution Take 175 mcg by nebulization daily.     No current facility-administered medications for this visit.    Allergies  Allergen Reactions  . Penicillins Anaphylaxis    Did it involve swelling of the face/tongue/throat, SOB, or low BP? yes Did it involve sudden or  severe rash/hives, skin peeling, or any reaction on the inside of your mouth or nose? no Did you need to seek medical attention at a hospital or doctor'Stephanie office? yes When did it last happen?pt was 20 If all above answers are "NO", may proceed with cephalosporin use.   . Codeine     hyper    Review of Systems:  Constitutional: Denies fever, chills, diaphoresis, appetite change and has fatigue.  HEENT: Denies photophobia, eye pain, redness, hearing loss, ear pain, congestion, sore throat, rhinorrhea, sneezing, mouth sores, neck pain, neck stiffness and tinnitus.   Respiratory: Has SOB, DOE. No cough, chest tightness,  and wheezing.   Cardiovascular: Denies chest pain, has palpitations and leg swelling.  Genitourinary: Denies dysuria, urgency, frequency, hematuria, flank pain and difficulty urinating.  Musculoskeletal: Denies myalgias, has back pain, joint swelling, arthralgias and gait problem.  Skin: No rash.  Neurological: Denies dizziness, seizures, syncope, weakness, light-headedness, numbness and headaches.  Hematological: Denies  adenopathy. Easy bruising, personal or family bleeding history  Psychiatric/Behavioral: Has anxiety or depression     Physical Exam:    BP (!) 154/80   Pulse 88   Ht '5\' 2"'  (1.575 m)   Wt 140 lb 2 oz (63.6 kg)   BMI 25.63 kg/m  Wt Readings from Last 3 Encounters:  06/08/20 140 lb 2 oz (63.6 kg)  11/24/19 129 lb (58.5 kg)  05/25/19 129 lb 3.2 oz (58.6 kg)   Constitutional:  Well-developed, in no acute distress. Psychiatric: Normal mood and affect. Behavior is normal. HEENT: Pupils normal.  Conjunctivae are normal. No scleral icterus. Neck supple.  Cardiovascular: Normal rate, regular rhythm. No edema Pulmonary/chest: Effort normal and breath sounds normal. No wheezing, rales or rhonchi. Abdominal: Soft, nondistended. Nontender. Bowel sounds active throughout. There are no masses palpable. No hepatomegaly. Rectal: Deferred Neurological: Alert  and oriented to person place and time. Skin: Skin is warm and dry. No rashes noted.  Data Reviewed: I have personally reviewed following labs and imaging studies  CBC: CBC Latest Ref Rng & Units 11/29/2018 11/22/2018 11/19/2018  WBC 4.0 - 10.5 K/uL 7.3 5.5 5.6  Hemoglobin 12.0 - 15.0 g/dL 8.4(L) 7.4(L) 7.2(L)  Hematocrit 36.0 - 46.0 % 30.2(L) 26.9(L) 25.4(L)  Platelets 150 - 400 K/uL 442(H) 272 223    CMP: CMP Latest Ref Rng & Units 11/24/2018 11/17/2018  Glucose 70 - 99 mg/dL 87 162(H)  BUN 8 - 23 mg/dL 12 17  Creatinine 0.44 - 1.00 mg/dL 0.90 0.88  Sodium 135 - 145 mmol/L 137 135  Potassium 3.5 - 5.1 mmol/L 4.1 3.5  Chloride 98 - 111 mmol/L 107 105  CO2 22 - 32 mmol/L 22 22  Calcium 8.9 - 10.3 mg/dL 9.6 9.2  Total Protein 6.5 - 8.1 g/dL - 5.8(L)  Total Bilirubin 0.3 - 1.2 mg/dL - 0.5  Alkaline Phos 38 - 126 U/L - 75  AST 15 - 41 U/L - 24  ALT 0 - 44 U/L - 19   Hepatic Function Latest Ref Rng & Units 11/17/2018  Total Protein 6.5 - 8.1 g/dL 5.8(L)  Albumin 3.5 - 5.0 g/dL 2.7(L)  AST 15 - 41 U/L 24  ALT 0 - 44 U/L 19  Alk Phosphatase 38 - 126 U/L 75  Total Bilirubin 0.3 - 1.2 mg/dL 0.5      Radiology Studies: CUP PACEART REMOTE DEVICE CHECK  Result Date: 06/03/2020 ILR summary report received. Battery status OK. Normal device function. No new symptom, tachy, brady, or pause episodes. No new AF episodes. Monthly summary reports and ROV/PRN  Extensive notes were reviewed   Carmell Austria, MD 06/08/2020, 4:31 PM  Cc: Stephanie Collie, MD

## 2020-06-14 DIAGNOSIS — K766 Portal hypertension: Secondary | ICD-10-CM | POA: Diagnosis not present

## 2020-06-14 DIAGNOSIS — D696 Thrombocytopenia, unspecified: Secondary | ICD-10-CM | POA: Diagnosis not present

## 2020-06-14 DIAGNOSIS — K7469 Other cirrhosis of liver: Secondary | ICD-10-CM | POA: Diagnosis not present

## 2020-06-14 DIAGNOSIS — K7290 Hepatic failure, unspecified without coma: Secondary | ICD-10-CM | POA: Diagnosis not present

## 2020-06-14 DIAGNOSIS — R161 Splenomegaly, not elsewhere classified: Secondary | ICD-10-CM | POA: Diagnosis not present

## 2020-06-14 DIAGNOSIS — R188 Other ascites: Secondary | ICD-10-CM | POA: Diagnosis not present

## 2020-06-15 LAB — PROTIME-INR
INR: 1.1 (ref 0.9–1.2)
Prothrombin Time: 11.2 s (ref 9.1–12.0)

## 2020-06-15 LAB — CBC
Hematocrit: 32.8 % — ABNORMAL LOW (ref 34.0–46.6)
Hemoglobin: 10.1 g/dL — ABNORMAL LOW (ref 11.1–15.9)
MCH: 23.2 pg — ABNORMAL LOW (ref 26.6–33.0)
MCHC: 30.8 g/dL — ABNORMAL LOW (ref 31.5–35.7)
MCV: 75 fL — ABNORMAL LOW (ref 79–97)
Platelets: 332 10*3/uL (ref 150–450)
RBC: 4.35 x10E6/uL (ref 3.77–5.28)
RDW: 19.2 % — ABNORMAL HIGH (ref 11.7–15.4)
WBC: 5.6 10*3/uL (ref 3.4–10.8)

## 2020-06-15 LAB — COMPREHENSIVE METABOLIC PANEL
ALT: 34 IU/L — ABNORMAL HIGH (ref 0–32)
AST: 40 IU/L (ref 0–40)
Albumin/Globulin Ratio: 1.1 — ABNORMAL LOW (ref 1.2–2.2)
Albumin: 3.7 g/dL (ref 3.6–4.6)
Alkaline Phosphatase: 133 IU/L — ABNORMAL HIGH (ref 44–121)
BUN/Creatinine Ratio: 16 (ref 12–28)
BUN: 14 mg/dL (ref 8–27)
Bilirubin Total: 0.4 mg/dL (ref 0.0–1.2)
CO2: 23 mmol/L (ref 20–29)
Calcium: 9.2 mg/dL (ref 8.7–10.3)
Chloride: 105 mmol/L (ref 96–106)
Creatinine, Ser: 0.89 mg/dL (ref 0.57–1.00)
GFR calc Af Amer: 70 mL/min/{1.73_m2} (ref >59)
GFR calc non Af Amer: 61 mL/min/{1.73_m2} (ref >59)
Globulin, Total: 3.3 g/dL (ref 1.5–4.5)
Glucose: 106 mg/dL — ABNORMAL HIGH (ref 65–99)
Potassium: 4 mmol/L (ref 3.5–5.2)
Sodium: 139 mmol/L (ref 134–144)
Total Protein: 7 g/dL (ref 6.0–8.5)

## 2020-06-15 LAB — AFP TUMOR MARKER: AFP, Serum, Tumor Marker: 1.4 ng/mL (ref 0.0–8.3)

## 2020-06-19 NOTE — Progress Notes (Signed)
Carelink Summary Report / Loop Recorder 

## 2020-06-20 ENCOUNTER — Inpatient Hospital Stay (HOSPITAL_BASED_OUTPATIENT_CLINIC_OR_DEPARTMENT_OTHER): Admission: RE | Admit: 2020-06-20 | Payer: Medicare Other | Source: Ambulatory Visit

## 2020-06-21 ENCOUNTER — Other Ambulatory Visit: Payer: Self-pay

## 2020-06-21 ENCOUNTER — Ambulatory Visit (HOSPITAL_BASED_OUTPATIENT_CLINIC_OR_DEPARTMENT_OTHER)
Admission: RE | Admit: 2020-06-21 | Discharge: 2020-06-21 | Disposition: A | Discharge status: Home / Self Care | Payer: Medicare Other | Source: Ambulatory Visit | Attending: Gastroenterology | Admitting: Gastroenterology

## 2020-06-21 DIAGNOSIS — K7290 Hepatic failure, unspecified without coma: Secondary | ICD-10-CM | POA: Insufficient documentation

## 2020-06-21 DIAGNOSIS — K7682 Hepatic encephalopathy: Secondary | ICD-10-CM

## 2020-06-21 DIAGNOSIS — R188 Other ascites: Secondary | ICD-10-CM | POA: Insufficient documentation

## 2020-06-21 DIAGNOSIS — K7460 Unspecified cirrhosis of liver: Secondary | ICD-10-CM | POA: Diagnosis not present

## 2020-06-21 DIAGNOSIS — K766 Portal hypertension: Secondary | ICD-10-CM | POA: Insufficient documentation

## 2020-06-21 DIAGNOSIS — R161 Splenomegaly, not elsewhere classified: Secondary | ICD-10-CM | POA: Insufficient documentation

## 2020-06-21 DIAGNOSIS — D696 Thrombocytopenia, unspecified: Secondary | ICD-10-CM | POA: Insufficient documentation

## 2020-06-21 DIAGNOSIS — K7469 Other cirrhosis of liver: Secondary | ICD-10-CM | POA: Diagnosis not present

## 2020-06-27 ENCOUNTER — Telehealth: Payer: Self-pay | Admitting: Gastroenterology

## 2020-06-27 NOTE — Telephone Encounter (Signed)
Patient call about results of [place] and labs, please call patient.

## 2020-06-27 NOTE — Telephone Encounter (Signed)
Spoke to patient to briefly go over her CT and lab results. She was informed that Dr Chales Abrahams has not viewed then at this time. She will be notified with any recommendations. Patient voiced understanding.

## 2020-07-01 ENCOUNTER — Other Ambulatory Visit: Payer: Self-pay | Admitting: Gastroenterology

## 2020-07-02 ENCOUNTER — Other Ambulatory Visit: Payer: Self-pay | Admitting: Gastroenterology

## 2020-07-09 ENCOUNTER — Ambulatory Visit (INDEPENDENT_AMBULATORY_CARE_PROVIDER_SITE_OTHER): Payer: Medicare Other

## 2020-07-09 DIAGNOSIS — I63119 Cerebral infarction due to embolism of unspecified vertebral artery: Secondary | ICD-10-CM | POA: Diagnosis not present

## 2020-07-10 ENCOUNTER — Other Ambulatory Visit: Payer: Self-pay | Admitting: Gastroenterology

## 2020-07-10 DIAGNOSIS — K7469 Other cirrhosis of liver: Secondary | ICD-10-CM

## 2020-07-11 LAB — CUP PACEART REMOTE DEVICE CHECK
Date Time Interrogation Session: 20220115235750
Implantable Pulse Generator Implant Date: 20200526

## 2020-07-23 NOTE — Progress Notes (Signed)
Carelink Summary Report / Loop Recorder 

## 2020-07-24 DIAGNOSIS — I471 Supraventricular tachycardia: Secondary | ICD-10-CM | POA: Diagnosis not present

## 2020-07-24 DIAGNOSIS — J449 Chronic obstructive pulmonary disease, unspecified: Secondary | ICD-10-CM | POA: Diagnosis not present

## 2020-07-24 DIAGNOSIS — I110 Hypertensive heart disease with heart failure: Secondary | ICD-10-CM | POA: Diagnosis not present

## 2020-07-24 DIAGNOSIS — I5083 High output heart failure: Secondary | ICD-10-CM | POA: Diagnosis not present

## 2020-07-25 ENCOUNTER — Other Ambulatory Visit: Payer: Self-pay | Admitting: Gastroenterology

## 2020-07-26 DIAGNOSIS — I471 Supraventricular tachycardia: Secondary | ICD-10-CM | POA: Diagnosis not present

## 2020-07-26 DIAGNOSIS — J449 Chronic obstructive pulmonary disease, unspecified: Secondary | ICD-10-CM | POA: Diagnosis not present

## 2020-07-26 DIAGNOSIS — I5083 High output heart failure: Secondary | ICD-10-CM | POA: Diagnosis not present

## 2020-07-26 DIAGNOSIS — D509 Iron deficiency anemia, unspecified: Secondary | ICD-10-CM | POA: Diagnosis not present

## 2020-08-03 DIAGNOSIS — D509 Iron deficiency anemia, unspecified: Secondary | ICD-10-CM | POA: Diagnosis not present

## 2020-08-06 DIAGNOSIS — I361 Nonrheumatic tricuspid (valve) insufficiency: Secondary | ICD-10-CM | POA: Diagnosis not present

## 2020-08-06 DIAGNOSIS — I340 Nonrheumatic mitral (valve) insufficiency: Secondary | ICD-10-CM | POA: Diagnosis not present

## 2020-08-06 DIAGNOSIS — I5083 High output heart failure: Secondary | ICD-10-CM | POA: Diagnosis not present

## 2020-08-10 LAB — CUP PACEART REMOTE DEVICE CHECK
Date Time Interrogation Session: 20220217235849
Implantable Pulse Generator Implant Date: 20200526

## 2020-08-13 ENCOUNTER — Ambulatory Visit (INDEPENDENT_AMBULATORY_CARE_PROVIDER_SITE_OTHER): Payer: Medicare Other

## 2020-08-13 DIAGNOSIS — I639 Cerebral infarction, unspecified: Secondary | ICD-10-CM

## 2020-08-13 DIAGNOSIS — D509 Iron deficiency anemia, unspecified: Secondary | ICD-10-CM | POA: Diagnosis not present

## 2020-08-16 ENCOUNTER — Other Ambulatory Visit: Payer: Self-pay | Admitting: Gastroenterology

## 2020-08-16 DIAGNOSIS — Z6823 Body mass index (BMI) 23.0-23.9, adult: Secondary | ICD-10-CM | POA: Diagnosis not present

## 2020-08-16 DIAGNOSIS — K921 Melena: Secondary | ICD-10-CM | POA: Diagnosis not present

## 2020-08-16 DIAGNOSIS — D509 Iron deficiency anemia, unspecified: Secondary | ICD-10-CM | POA: Diagnosis not present

## 2020-08-16 NOTE — Progress Notes (Signed)
Carelink Summary Report / Loop Recorder 

## 2020-08-21 DIAGNOSIS — I5083 High output heart failure: Secondary | ICD-10-CM | POA: Diagnosis not present

## 2020-08-21 DIAGNOSIS — J449 Chronic obstructive pulmonary disease, unspecified: Secondary | ICD-10-CM | POA: Diagnosis not present

## 2020-08-21 DIAGNOSIS — I471 Supraventricular tachycardia: Secondary | ICD-10-CM | POA: Diagnosis not present

## 2020-08-21 DIAGNOSIS — I110 Hypertensive heart disease with heart failure: Secondary | ICD-10-CM | POA: Diagnosis not present

## 2020-08-22 ENCOUNTER — Telehealth: Payer: Self-pay | Admitting: Gastroenterology

## 2020-08-22 NOTE — Telephone Encounter (Signed)
Dr Marco Collie called Stephanie Love.  Patient with NASH/autoimmune cirrhosis with IDA and heme positive stools. Had multiple IV iron infusions. Hb 8.5 She is on Prevacid  Would need further GI evaluation-May require EGD/colon (at [facility])/[facility]  [name], Can you please work her into [facility] clinic. Please have last note from Dr. Nyra Capes available at the time of OV RG

## 2020-08-23 DIAGNOSIS — Z6823 Body mass index (BMI) 23.0-23.9, adult: Secondary | ICD-10-CM | POA: Diagnosis not present

## 2020-08-23 DIAGNOSIS — K754 Autoimmune hepatitis: Secondary | ICD-10-CM | POA: Diagnosis not present

## 2020-08-23 DIAGNOSIS — I69359 Hemiplegia and hemiparesis following cerebral infarction affecting unspecified side: Secondary | ICD-10-CM | POA: Diagnosis not present

## 2020-08-23 DIAGNOSIS — D509 Iron deficiency anemia, unspecified: Secondary | ICD-10-CM | POA: Diagnosis not present

## 2020-08-23 NOTE — Telephone Encounter (Signed)
Records received from Dr Nyra Capes office. Looks like the [place] office scheduled patient with Dr Lyndel Safe.

## 2020-09-07 DIAGNOSIS — E039 Hypothyroidism, unspecified: Secondary | ICD-10-CM | POA: Diagnosis not present

## 2020-09-13 DIAGNOSIS — F339 Major depressive disorder, recurrent, unspecified: Secondary | ICD-10-CM | POA: Diagnosis not present

## 2020-09-13 DIAGNOSIS — Z6824 Body mass index (BMI) 24.0-24.9, adult: Secondary | ICD-10-CM | POA: Diagnosis not present

## 2020-09-13 DIAGNOSIS — D509 Iron deficiency anemia, unspecified: Secondary | ICD-10-CM | POA: Diagnosis not present

## 2020-09-15 ENCOUNTER — Other Ambulatory Visit: Payer: Self-pay | Admitting: Gastroenterology

## 2020-09-16 LAB — CUP PACEART REMOTE DEVICE CHECK
Date Time Interrogation Session: 20220323010011
Implantable Pulse Generator Implant Date: 20200526

## 2020-09-17 ENCOUNTER — Ambulatory Visit (INDEPENDENT_AMBULATORY_CARE_PROVIDER_SITE_OTHER): Payer: Medicare Other

## 2020-09-17 DIAGNOSIS — I63119 Cerebral infarction due to embolism of unspecified vertebral artery: Secondary | ICD-10-CM | POA: Diagnosis not present

## 2020-09-21 DIAGNOSIS — D509 Iron deficiency anemia, unspecified: Secondary | ICD-10-CM | POA: Diagnosis not present

## 2020-09-21 DIAGNOSIS — E785 Hyperlipidemia, unspecified: Secondary | ICD-10-CM | POA: Diagnosis not present

## 2020-09-21 DIAGNOSIS — J449 Chronic obstructive pulmonary disease, unspecified: Secondary | ICD-10-CM | POA: Diagnosis not present

## 2020-09-21 DIAGNOSIS — F329 Major depressive disorder, single episode, unspecified: Secondary | ICD-10-CM | POA: Diagnosis not present

## 2020-10-01 ENCOUNTER — Ambulatory Visit (INDEPENDENT_AMBULATORY_CARE_PROVIDER_SITE_OTHER): Payer: Medicare Other | Admitting: Gastroenterology

## 2020-10-01 ENCOUNTER — Encounter: Payer: Self-pay | Admitting: Gastroenterology

## 2020-10-01 ENCOUNTER — Other Ambulatory Visit: Payer: Self-pay

## 2020-10-01 VITALS — BP 140/70 | HR 74 | Ht 62.0 in | Wt 134.5 lb

## 2020-10-01 DIAGNOSIS — K7460 Unspecified cirrhosis of liver: Secondary | ICD-10-CM | POA: Diagnosis not present

## 2020-10-01 DIAGNOSIS — R195 Other fecal abnormalities: Secondary | ICD-10-CM | POA: Diagnosis not present

## 2020-10-01 DIAGNOSIS — D509 Iron deficiency anemia, unspecified: Secondary | ICD-10-CM

## 2020-10-01 NOTE — Patient Instructions (Signed)
If you are age 83 or older, your body mass index should be between 23-30. Your Body mass index is 24.6 kg/m. If this is out of the aforementioned range listed, please consider follow up with your Primary Care Provider.  If you are age 30 or younger, your body mass index should be between 19-25. Your Body mass index is 24.6 kg/m. If this is out of the aformentioned range listed, please consider follow up with your Primary Care Provider.   You have been scheduled for an endoscopy and colonoscopy. Please follow the written instructions given to you at your visit today. Please pick up your prep supplies at the pharmacy within the next 1-3 days. If you use inhalers (even only as needed), please bring them with you on the day of your procedure.  You will have lab work as well  Continue Prevacid and Lasix  Continue low salt diet  Thank you,  Dr. Jackquline Denmark

## 2020-10-01 NOTE — Progress Notes (Signed)
Chief Complaint:   Referring Provider:  Marco Collie, MD      ASSESSMENT AND PLAN;   #1. 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  May have some autoimmune component.  Mild splenomegaly. No thrombocytopenia, coagulopathy, HE and jaundice.  #2.  Heme pos stools 07/2020.  #3.  Peripheral edema but with ascites (resolved). No CHF on recent echo.  #4.  Chronic IDA, S/P multiple iron infusions. Multifactorial d/t hypersplenism, decreased iron intake, slow GI bleeding related to portal hypertensive gastropathy/early GAVE, mild chronic renal insufficiency. Neg colonoscopy Jan 2016 and CT AP 09/2016  Plan: -Continue Pravacid 25m po qd -We will continue Lasix 20 mg p.o. once a day -Low-salt diet. [facility] -CBC, CMP, PT INR, AFP at time of endo procedures.  Discussed risks & benefits. Risks including rare perforation req laparotomy, bleeding after bx/polypectomy req blood transfusion, rarely missing neoplasms, risks of anesthesia/sedation. Benefits outweigh the risks. Patient agrees to proceed. All the questions were answered. Consent forms given for review.   HPI:    Stephanie CONNERYis a 85y.o. female  With H/O CHF, COPD, chronic anemia, NASH cirrhosis, GERD, HLD, HTN, CVA, CKD2, S/P cholecystectomy 2013, vaginal hysterectomy  With nonalcoholic liver cirrhosis - Dx on CT Oct 2016 with longstanding H/O fatty liver. -Neg acute hepatitis panel, celiac screen, positive ANA, positive AMA (132), positive ASMA (81).  May have some autoimmune component.  Refused liver biopsy. -S/P vaccines for hepatitis a and B. -EGD 10/2015 did not show any esophageal varices.  However, had portal hypertensive gastropathy ?Early GAVE. -Negative colonoscopy January 2016 except for colonic polyps s/p polypectomy, moderate predominantly sigmoid diverticulosis.  Highly redundant colon.  Internal hemorrhoids.  Bx- TAs.  Hold off on routine colon d/t age -CT AP 09/2016: Cirrhosis without  ascites or splenomegaly. -[place]: Liver cirrhosis, borderline splenomegaly measuring 12 cm.  No focal lesions. -[place]: Cirrhosis, no focal hepatic lesion, ascites, mild splenomegaly.  With chronic IDA-thought to be multifactorial d/t hypersplenism, decreased iron intake, slow GI bleeding related to portal hypertensive gastropathy, mild chronic renal insufficiency.    Recently had heme positive stools 08/16/2020 with Hb 8, s/p IV Fe.  She did have fatigue, weakness.  Has felt much better after iron infusion.  Sent to GI clinic for further evaluation.    Her ankle swelling has resolved on Lasix.  She had 2D echo 10/2018 which showed ejection fraction of 60 to 65%.  Has been having increasing constipation.  No change in mental status.  Has history of easy bruisability and has been taken off aspirin which helped.  Heartburn is under good control with Prevacid.  She denies having any nausea, vomiting, hematemesis, melena, recent weight loss or loss of appetite.  No sodas, chocolates, chewing gums, artificial sweeteners and candy. No NSAIDs   2DE 10/2018: ejection  fraction of 60-65% Past Medical History:  Diagnosis Date  . Anemia   . Arthritis   . [place]immune hepatitis ([place]   . Cir[place]osis ([place]   . Congestive heart failure ([place]   . [place] disease[place]   . Depression   . Diverticulosis   . GERD (gastroesophageal reflux disease)   . Hepatitis A virus infection 1963  . History of colon polyps   . History of colon polyps   . Hyperlipidemia   . Hypertension   . Hypothyroid   . IDA (iron deficiency anemia)    Does iron infusion  . Microhematuria   .  NASH (nonalcoholic steatohepatitis)   . Neuropathy   . Osteoarthritis   . Rectal prolapse   . Stage 2 chronic kidney disease   . Stroke ([place]) 11/13/2018  . UTI (urinary tract infection)     Past Surgical History:  Procedure Laterality Date  . ABDOMINAL HYSTERECTOMY  2012  . AMPUTATION TOE      right 2nd toe   . BREAST SURGERY     Breast tumor removed non-malignant  . BUNIONECTOMY  05/01/2016  . CHOLECYSTECTOMY  2013  . COLONOSCOPY  07/12/2014   Colonic polyps status post polypectomy. Moderate predominanlty sigmoid diverticulosis. High redundant colon.  . ESOPHAGOGASTRODUODENOSCOPY  11/14/2015   Mild Candida esophagitis. Mild gastritis. Incidental gastic polyps (status post polypectomy x2)  . LOOP RECORDER INSERTION N/A 11/16/2018   Procedure: LOOP RECORDER INSERTION;  Surgeon: Thompson Grayer, MD;  Location: [facility];  Service: Cardiovascular;  Laterality: N/A;  . PARTIAL HIP ARTHROPLASTY Left 12/2018   [facility]  . vaginal lesion removed     Precancerous    Family History  Problem Relation Age of Onset  . Cancer Mother   . Diabetes Father   . Heart disease Father     Social History   Tobacco Use  . Smoking status: Former Smoker    Types: Cigarettes    Quit date: 11/16/1981    Years since quitting: 38.9  . Smokeless tobacco: Never Used  Vaping Use  . Vaping Use: Never used  Substance Use Topics  . Alcohol use: Not Currently  . Drug use: Never    Current Outpatient Medications  Medication Sig Dispense Refill  . alendronate (FOSAMAX) 70 MG tablet Take 70 mg by mouth once a week.    Marland Kitchen arformoterol (BROVANA) 15 MCG/2ML NEBU Take 15 mcg by nebulization 2 (two) times daily.    . budesonide (PULMICORT) 0.5 MG/2ML nebulizer solution Take 0.5 mg by nebulization 2 (two) times daily.    Marland Kitchen escitalopram (LEXAPRO) 10 MG tablet Take 10 mg by mouth daily.    . furosemide (LASIX) 20 MG tablet TAKE 1 TABLET BY MOUTH DAILY. TAKE DAILY UNTIL SWELLING IS DOWN THEN 1 TABLET EVERY OTHER DAY 30 tablet 0  . lansoprazole (PREVACID) 15 MG capsule Take 30 mg by mouth daily at 12 noon.    Marland Kitchen levothyroxine (SYNTHROID, LEVOTHROID) 112 MCG tablet Take 112 mcg by mouth daily before breakfast.    . metoprolol succinate (TOPROL-XL) 25 MG 24 hr tablet Take 1 tablet (25 mg  total) by mouth daily. 30 tablet 1  . revefenacin (YUPELRI) 175 MCG/3ML nebulizer solution Take 175 mcg by nebulization daily.     No current facility-administered medications for this visit.    Allergies  Allergen Reactions  . Penicillins Anaphylaxis    Did it involve swelling of the face/tongue/throat, SOB, or low BP? yes Did it involve sudden or severe rash/hives, skin peeling, or any reaction on the inside of your mouth or nose? no Did you need to seek medical attention at a hospital or doctor's office? yes When did it last happen?pt was 20 If all above answers are "NO", may proceed with cephalosporin use.   . Codeine     hyper    Review of Systems:  neg     Physical Exam:    BP 140/70   Pulse 74   Ht '5\' 2"'  (1.575 m)   Wt 134 lb 8 oz (61 kg)   BMI 24.60 kg/m  Wt Readings from Last 3 Encounters:  10/01/20  134 lb 8 oz (61 kg)  06/08/20 140 lb 2 oz (63.6 kg)  11/24/19 129 lb (58.5 kg)   Constitutional:  Well-developed, in no acute distress. Psychiatric: Normal mood and affect. Behavior is normal. HEENT: Pupils normal.  Conjunctivae are normal. No scleral icterus. Neck supple.  Cardiovascular: Normal rate, regular rhythm. No edema Pulmonary/chest: Effort normal and breath sounds normal. No wheezing, rales or rhonchi. Abdominal: Soft, nondistended. Nontender. Bowel sounds active throughout. There are no masses palpable. No hepatomegaly. Rectal: Deferred Neurological: Alert and oriented to person place and time. Skin: Skin is warm and dry. No rashes noted.  Data Reviewed: I have personally reviewed following labs and imaging studies  CBC: CBC Latest Ref Rng & Units 06/14/2020 11/29/2018 11/22/2018  WBC 3.4 - 10.8 x10E3/uL 5.6 7.3 5.5  Hemoglobin 11.1 - 15.9 g/dL 10.1(L) 8.4(L) 7.4(L)  Hematocrit 34.0 - 46.6 % 32.8(L) 30.2(L) 26.9(L)  Platelets 150 - 450 x10E3/uL 332 442(H) 272    CMP: CMP Latest Ref Rng & Units 06/14/2020 11/24/2018 11/17/2018  Glucose 65 - 99  mg/dL 106(H) 87 162(H)  BUN 8 - 27 mg/dL '14 12 17  ' Creatinine 0.57 - 1.00 mg/dL 0.89 0.90 0.88  Sodium 134 - 144 mmol/L 139 137 135  Potassium 3.5 - 5.2 mmol/L 4.0 4.1 3.5  Chloride 96 - 106 mmol/L 105 107 105  CO2 20 - 29 mmol/L '23 22 22  ' Calcium 8.7 - 10.3 mg/dL 9.2 9.6 9.2  Total Protein 6.0 - 8.5 g/dL 7.0 - 5.8(L)  Total Bilirubin 0.0 - 1.2 mg/dL 0.4 - 0.5  Alkaline Phos 44 - 121 IU/L 133(H) - 75  AST 0 - 40 IU/L 40 - 24  ALT 0 - 32 IU/L 34(H) - 19   Hepatic Function Latest Ref Rng & Units 06/14/2020 11/17/2018  Total Protein 6.0 - 8.5 g/dL 7.0 5.8(L)  Albumin 3.6 - 4.6 g/dL 3.7 2.7(L)  AST 0 - 40 IU/L 40 24  ALT 0 - 32 IU/L 34(H) 19  Alk Phosphatase 44 - 121 IU/L 133(H) 75  Total Bilirubin 0.0 - 1.2 mg/dL 0.4 0.5      Radiology Studies: CUP PACEART REMOTE DEVICE CHECK  Result Date: 09/16/2020 ILR summary report received. Battery status OK. Normal device function. No new symptom, tachy, brady, or pause episodes. No new AF episodes. Monthly summary reports and ROV/PRN Kathy Breach, RN, CCDS, [employer]    Carmell Austria, MD 10/01/2020, 2:15 PM  Cc: Marco Collie, MD

## 2020-10-01 NOTE — Progress Notes (Signed)
Carelink Summary Report / Loop Recorder 

## 2020-10-13 ENCOUNTER — Other Ambulatory Visit: Payer: Self-pay | Admitting: Gastroenterology

## 2020-10-15 ENCOUNTER — Ambulatory Visit (INDEPENDENT_AMBULATORY_CARE_PROVIDER_SITE_OTHER): Payer: Medicare Other

## 2020-10-15 DIAGNOSIS — I63119 Cerebral infarction due to embolism of unspecified vertebral artery: Secondary | ICD-10-CM | POA: Diagnosis not present

## 2020-10-15 NOTE — Addendum Note (Signed)
Addended by: Curlene Labrum E on: 10/15/2020 08:20 AM   Modules accepted: Orders

## 2020-10-16 LAB — CUP PACEART REMOTE DEVICE CHECK
Date Time Interrogation Session: 20220425010100
Implantable Pulse Generator Implant Date: 20200526

## 2020-10-18 DIAGNOSIS — Z7189 Other specified counseling: Secondary | ICD-10-CM | POA: Diagnosis not present

## 2020-10-18 DIAGNOSIS — D509 Iron deficiency anemia, unspecified: Secondary | ICD-10-CM | POA: Diagnosis not present

## 2020-10-18 DIAGNOSIS — Z136 Encounter for screening for cardiovascular disorders: Secondary | ICD-10-CM | POA: Diagnosis not present

## 2020-10-18 DIAGNOSIS — Z6823 Body mass index (BMI) 23.0-23.9, adult: Secondary | ICD-10-CM | POA: Diagnosis not present

## 2020-10-18 DIAGNOSIS — Z1339 Encounter for screening examination for other mental health and behavioral disorders: Secondary | ICD-10-CM | POA: Diagnosis not present

## 2020-10-18 DIAGNOSIS — Z0000 Encounter for general adult medical examination without abnormal findings: Secondary | ICD-10-CM | POA: Diagnosis not present

## 2020-10-18 DIAGNOSIS — I110 Hypertensive heart disease with heart failure: Secondary | ICD-10-CM | POA: Diagnosis not present

## 2020-10-18 DIAGNOSIS — Z1331 Encounter for screening for depression: Secondary | ICD-10-CM | POA: Diagnosis not present

## 2020-10-18 DIAGNOSIS — Z139 Encounter for screening, unspecified: Secondary | ICD-10-CM | POA: Diagnosis not present

## 2020-10-21 DIAGNOSIS — F329 Major depressive disorder, single episode, unspecified: Secondary | ICD-10-CM | POA: Diagnosis not present

## 2020-10-21 DIAGNOSIS — E785 Hyperlipidemia, unspecified: Secondary | ICD-10-CM | POA: Diagnosis not present

## 2020-10-21 DIAGNOSIS — J449 Chronic obstructive pulmonary disease, unspecified: Secondary | ICD-10-CM | POA: Diagnosis not present

## 2020-10-24 DIAGNOSIS — M549 Dorsalgia, unspecified: Secondary | ICD-10-CM | POA: Diagnosis not present

## 2020-10-24 DIAGNOSIS — M545 Low back pain, unspecified: Secondary | ICD-10-CM | POA: Diagnosis not present

## 2020-10-24 DIAGNOSIS — I10 Essential (primary) hypertension: Secondary | ICD-10-CM | POA: Diagnosis not present

## 2020-10-24 DIAGNOSIS — R52 Pain, unspecified: Secondary | ICD-10-CM | POA: Diagnosis not present

## 2020-10-24 DIAGNOSIS — M5440 Lumbago with sciatica, unspecified side: Secondary | ICD-10-CM | POA: Diagnosis not present

## 2020-10-25 DIAGNOSIS — M419 Scoliosis, unspecified: Secondary | ICD-10-CM | POA: Diagnosis not present

## 2020-10-25 DIAGNOSIS — Z23 Encounter for immunization: Secondary | ICD-10-CM | POA: Diagnosis not present

## 2020-10-25 DIAGNOSIS — M5136 Other intervertebral disc degeneration, lumbar region: Secondary | ICD-10-CM | POA: Diagnosis not present

## 2020-10-25 DIAGNOSIS — M545 Low back pain, unspecified: Secondary | ICD-10-CM | POA: Diagnosis not present

## 2020-11-01 NOTE — Progress Notes (Signed)
Carelink Summary Report / Loop Recorder 

## 2020-11-04 DIAGNOSIS — M1990 Unspecified osteoarthritis, unspecified site: Secondary | ICD-10-CM | POA: Diagnosis not present

## 2020-11-04 DIAGNOSIS — Z8673 Personal history of transient ischemic attack (TIA), and cerebral infarction without residual deficits: Secondary | ICD-10-CM | POA: Diagnosis not present

## 2020-11-04 DIAGNOSIS — M545 Low back pain, unspecified: Secondary | ICD-10-CM | POA: Diagnosis not present

## 2020-11-04 DIAGNOSIS — Z87891 Personal history of nicotine dependence: Secondary | ICD-10-CM | POA: Diagnosis not present

## 2020-11-04 DIAGNOSIS — Z952 Presence of prosthetic heart valve: Secondary | ICD-10-CM | POA: Diagnosis not present

## 2020-11-04 DIAGNOSIS — E039 Hypothyroidism, unspecified: Secondary | ICD-10-CM | POA: Diagnosis not present

## 2020-11-04 DIAGNOSIS — Z79899 Other long term (current) drug therapy: Secondary | ICD-10-CM | POA: Diagnosis not present

## 2020-11-04 DIAGNOSIS — Z7982 Long term (current) use of aspirin: Secondary | ICD-10-CM | POA: Diagnosis not present

## 2020-11-04 DIAGNOSIS — M549 Dorsalgia, unspecified: Secondary | ICD-10-CM | POA: Diagnosis not present

## 2020-11-04 DIAGNOSIS — I10 Essential (primary) hypertension: Secondary | ICD-10-CM | POA: Diagnosis not present

## 2020-11-04 DIAGNOSIS — J449 Chronic obstructive pulmonary disease, unspecified: Secondary | ICD-10-CM | POA: Diagnosis not present

## 2020-11-08 DIAGNOSIS — M5410 Radiculopathy, site unspecified: Secondary | ICD-10-CM | POA: Diagnosis not present

## 2020-11-08 DIAGNOSIS — S81812S Laceration without foreign body, left lower leg, sequela: Secondary | ICD-10-CM | POA: Diagnosis not present

## 2020-11-08 DIAGNOSIS — D509 Iron deficiency anemia, unspecified: Secondary | ICD-10-CM | POA: Diagnosis not present

## 2020-11-08 DIAGNOSIS — Z6825 Body mass index (BMI) 25.0-25.9, adult: Secondary | ICD-10-CM | POA: Diagnosis not present

## 2020-11-14 ENCOUNTER — Other Ambulatory Visit: Payer: Self-pay | Admitting: Gastroenterology

## 2020-11-16 DIAGNOSIS — M6281 Muscle weakness (generalized): Secondary | ICD-10-CM | POA: Diagnosis not present

## 2020-11-16 DIAGNOSIS — D509 Iron deficiency anemia, unspecified: Secondary | ICD-10-CM | POA: Diagnosis not present

## 2020-11-16 DIAGNOSIS — M79604 Pain in right leg: Secondary | ICD-10-CM | POA: Diagnosis not present

## 2020-11-16 DIAGNOSIS — M5459 Other low back pain: Secondary | ICD-10-CM | POA: Diagnosis not present

## 2020-11-16 DIAGNOSIS — M79605 Pain in left leg: Secondary | ICD-10-CM | POA: Diagnosis not present

## 2020-11-19 LAB — CUP PACEART REMOTE DEVICE CHECK
Date Time Interrogation Session: 20220528011106
Implantable Pulse Generator Implant Date: 20200526

## 2020-11-20 ENCOUNTER — Ambulatory Visit (INDEPENDENT_AMBULATORY_CARE_PROVIDER_SITE_OTHER): Payer: Medicare Other

## 2020-11-20 DIAGNOSIS — I639 Cerebral infarction, unspecified: Secondary | ICD-10-CM

## 2020-11-21 DIAGNOSIS — M79604 Pain in right leg: Secondary | ICD-10-CM | POA: Diagnosis not present

## 2020-11-21 DIAGNOSIS — F329 Major depressive disorder, single episode, unspecified: Secondary | ICD-10-CM | POA: Diagnosis not present

## 2020-11-21 DIAGNOSIS — M6281 Muscle weakness (generalized): Secondary | ICD-10-CM | POA: Diagnosis not present

## 2020-11-21 DIAGNOSIS — S8992XA Unspecified injury of left lower leg, initial encounter: Secondary | ICD-10-CM | POA: Diagnosis not present

## 2020-11-21 DIAGNOSIS — M79662 Pain in left lower leg: Secondary | ICD-10-CM | POA: Diagnosis not present

## 2020-11-21 DIAGNOSIS — M5459 Other low back pain: Secondary | ICD-10-CM | POA: Diagnosis not present

## 2020-11-21 DIAGNOSIS — J449 Chronic obstructive pulmonary disease, unspecified: Secondary | ICD-10-CM | POA: Diagnosis not present

## 2020-11-21 DIAGNOSIS — E785 Hyperlipidemia, unspecified: Secondary | ICD-10-CM | POA: Diagnosis not present

## 2020-11-21 DIAGNOSIS — I110 Hypertensive heart disease with heart failure: Secondary | ICD-10-CM | POA: Diagnosis not present

## 2020-11-21 DIAGNOSIS — M79605 Pain in left leg: Secondary | ICD-10-CM | POA: Diagnosis not present

## 2020-11-21 DIAGNOSIS — Z6825 Body mass index (BMI) 25.0-25.9, adult: Secondary | ICD-10-CM | POA: Diagnosis not present

## 2020-11-21 DIAGNOSIS — L03119 Cellulitis of unspecified part of limb: Secondary | ICD-10-CM | POA: Diagnosis not present

## 2020-11-23 DIAGNOSIS — D509 Iron deficiency anemia, unspecified: Secondary | ICD-10-CM | POA: Diagnosis not present

## 2020-11-26 DIAGNOSIS — M79604 Pain in right leg: Secondary | ICD-10-CM | POA: Diagnosis not present

## 2020-11-26 DIAGNOSIS — Z6824 Body mass index (BMI) 24.0-24.9, adult: Secondary | ICD-10-CM | POA: Diagnosis not present

## 2020-11-26 DIAGNOSIS — L03119 Cellulitis of unspecified part of limb: Secondary | ICD-10-CM | POA: Diagnosis not present

## 2020-11-26 DIAGNOSIS — I110 Hypertensive heart disease with heart failure: Secondary | ICD-10-CM | POA: Diagnosis not present

## 2020-11-26 DIAGNOSIS — M6281 Muscle weakness (generalized): Secondary | ICD-10-CM | POA: Diagnosis not present

## 2020-11-26 DIAGNOSIS — S81811A Laceration without foreign body, right lower leg, initial encounter: Secondary | ICD-10-CM | POA: Diagnosis not present

## 2020-11-26 DIAGNOSIS — M79605 Pain in left leg: Secondary | ICD-10-CM | POA: Diagnosis not present

## 2020-11-26 DIAGNOSIS — M5459 Other low back pain: Secondary | ICD-10-CM | POA: Diagnosis not present

## 2020-11-29 DIAGNOSIS — D509 Iron deficiency anemia, unspecified: Secondary | ICD-10-CM | POA: Diagnosis not present

## 2020-11-29 DIAGNOSIS — Z6824 Body mass index (BMI) 24.0-24.9, adult: Secondary | ICD-10-CM | POA: Diagnosis not present

## 2020-11-29 DIAGNOSIS — S81802S Unspecified open wound, left lower leg, sequela: Secondary | ICD-10-CM | POA: Diagnosis not present

## 2020-11-30 ENCOUNTER — Other Ambulatory Visit: Payer: Self-pay

## 2020-11-30 ENCOUNTER — Other Ambulatory Visit (HOSPITAL_COMMUNITY)
Admission: RE | Admit: 2020-11-30 | Discharge: 2020-11-30 | Disposition: A | Discharge status: Home / Self Care | Payer: Medicare Other | Source: Ambulatory Visit | Attending: Gastroenterology | Admitting: Gastroenterology

## 2020-11-30 ENCOUNTER — Encounter (HOSPITAL_COMMUNITY): Payer: Self-pay | Admitting: Gastroenterology

## 2020-11-30 DIAGNOSIS — Z01812 Encounter for preprocedural laboratory examination: Secondary | ICD-10-CM | POA: Diagnosis not present

## 2020-11-30 DIAGNOSIS — Z20822 Contact with and (suspected) exposure to covid-19: Secondary | ICD-10-CM | POA: Diagnosis not present

## 2020-11-30 LAB — SARS CORONAVIRUS 2 (TAT 6-24 HRS): SARS Coronavirus 2: NEGATIVE

## 2020-12-03 ENCOUNTER — Encounter (HOSPITAL_COMMUNITY): Payer: Self-pay | Admitting: Gastroenterology

## 2020-12-03 NOTE — Anesthesia Preprocedure Evaluation (Addendum)
Anesthesia Evaluation  Patient identified by MRN, date of birth, ID band Patient awake    Reviewed: Allergy & Precautions, NPO status , Patient's Chart, lab work & pertinent test results  Airway Mallampati: II  TM Distance: >3 FB Neck ROM: Full    Dental no notable dental hx. (+) Partial Lower   Pulmonary COPD,  COPD inhaler, former smoker,    Pulmonary exam normal breath sounds clear to auscultation       Cardiovascular hypertension, Pt. on medications and Pt. on home beta blockers Normal cardiovascular exam Rhythm:Regular Rate:Normal  Loop recorder insertion 5/20 echo   1. The left ventricle has normal systolic function with an ejection  fraction of 60-65%. The cavity size was normal. Left ventricular diastolic  Doppler parameters are consistent with impaired relaxation. No evidence of  left ventricular regional wall  motion abnormalities.  2. The right ventricle has normal systolic function. The cavity was  normal. There is no increase in right ventricular wall thickness. Right  ventricular systolic pressure is mildly elevated with an estimated  pressure of 33.9 mmHg.    Neuro/Psych Depression CVA    GI/Hepatic GERD  Medicated and Controlled,(+) Cirrhosis       ,   Endo/Other  Hypothyroidism   Renal/GU Renal disease     Musculoskeletal  (+) Arthritis ,   Abdominal   Peds  Hematology  (+) anemia ,   Anesthesia Other Findings   Reproductive/Obstetrics                            Anesthesia Physical Anesthesia Plan  ASA: 3  Anesthesia Plan: MAC   Post-op Pain Management:    Induction:   PONV Risk Score and Plan: Treatment may vary due to age or medical condition  Airway Management Planned: Natural Airway  Additional Equipment: None  Intra-op Plan:   Post-operative Plan:   Informed Consent: I have reviewed the patients History and Physical, chart, labs and discussed  the procedure including the risks, benefits and alternatives for the proposed anesthesia with the patient or authorized representative who has indicated his/her understanding and acceptance.     Dental advisory given  Plan Discussed with: CRNA and Anesthesiologist  Anesthesia Plan Comments: (Heme + Stool Cirrhosis Fe def Anemia for EGD [place])       Anesthesia Quick Evaluation

## 2020-12-04 ENCOUNTER — Ambulatory Visit (HOSPITAL_COMMUNITY): Payer: Medicare Other | Admitting: Anesthesiology

## 2020-12-04 ENCOUNTER — Ambulatory Visit (HOSPITAL_COMMUNITY)
Admission: RE | Admit: 2020-12-04 | Discharge: 2020-12-04 | Disposition: A | Discharge status: Home / Self Care | Payer: Medicare Other | Attending: Gastroenterology | Admitting: Gastroenterology

## 2020-12-04 ENCOUNTER — Encounter (HOSPITAL_COMMUNITY)
Admission: RE | Disposition: A | Discharge status: Home / Self Care | Payer: Self-pay | Source: Home / Self Care | Attending: Gastroenterology

## 2020-12-04 ENCOUNTER — Other Ambulatory Visit: Payer: Self-pay

## 2020-12-04 ENCOUNTER — Encounter (HOSPITAL_COMMUNITY): Payer: Self-pay | Admitting: Gastroenterology

## 2020-12-04 DIAGNOSIS — Z79899 Other long term (current) drug therapy: Secondary | ICD-10-CM | POA: Diagnosis not present

## 2020-12-04 DIAGNOSIS — Z9049 Acquired absence of other specified parts of digestive tract: Secondary | ICD-10-CM | POA: Diagnosis not present

## 2020-12-04 DIAGNOSIS — K5730 Diverticulosis of large intestine without perforation or abscess without bleeding: Secondary | ICD-10-CM | POA: Insufficient documentation

## 2020-12-04 DIAGNOSIS — K5520 Angiodysplasia of colon without hemorrhage: Secondary | ICD-10-CM | POA: Insufficient documentation

## 2020-12-04 DIAGNOSIS — I8510 Secondary esophageal varices without bleeding: Secondary | ICD-10-CM | POA: Diagnosis not present

## 2020-12-04 DIAGNOSIS — K766 Portal hypertension: Secondary | ICD-10-CM | POA: Diagnosis not present

## 2020-12-04 DIAGNOSIS — K7581 Nonalcoholic steatohepatitis (NASH): Secondary | ICD-10-CM | POA: Diagnosis not present

## 2020-12-04 DIAGNOSIS — K3189 Other diseases of stomach and duodenum: Secondary | ICD-10-CM | POA: Insufficient documentation

## 2020-12-04 DIAGNOSIS — K7460 Unspecified cirrhosis of liver: Secondary | ICD-10-CM | POA: Diagnosis not present

## 2020-12-04 DIAGNOSIS — G629 Polyneuropathy, unspecified: Secondary | ICD-10-CM | POA: Insufficient documentation

## 2020-12-04 DIAGNOSIS — Z7983 Long term (current) use of bisphosphonates: Secondary | ICD-10-CM | POA: Diagnosis not present

## 2020-12-04 DIAGNOSIS — Z880 Allergy status to penicillin: Secondary | ICD-10-CM | POA: Insufficient documentation

## 2020-12-04 DIAGNOSIS — K921 Melena: Secondary | ICD-10-CM | POA: Diagnosis not present

## 2020-12-04 DIAGNOSIS — R195 Other fecal abnormalities: Secondary | ICD-10-CM

## 2020-12-04 DIAGNOSIS — D509 Iron deficiency anemia, unspecified: Secondary | ICD-10-CM | POA: Insufficient documentation

## 2020-12-04 DIAGNOSIS — K648 Other hemorrhoids: Secondary | ICD-10-CM | POA: Diagnosis not present

## 2020-12-04 DIAGNOSIS — Z89421 Acquired absence of other right toe(s): Secondary | ICD-10-CM | POA: Insufficient documentation

## 2020-12-04 DIAGNOSIS — J449 Chronic obstructive pulmonary disease, unspecified: Secondary | ICD-10-CM | POA: Insufficient documentation

## 2020-12-04 DIAGNOSIS — Z87891 Personal history of nicotine dependence: Secondary | ICD-10-CM | POA: Diagnosis not present

## 2020-12-04 DIAGNOSIS — N182 Chronic kidney disease, stage 2 (mild): Secondary | ICD-10-CM | POA: Insufficient documentation

## 2020-12-04 DIAGNOSIS — Z7951 Long term (current) use of inhaled steroids: Secondary | ICD-10-CM | POA: Diagnosis not present

## 2020-12-04 DIAGNOSIS — I509 Heart failure, unspecified: Secondary | ICD-10-CM | POA: Diagnosis not present

## 2020-12-04 DIAGNOSIS — E785 Hyperlipidemia, unspecified: Secondary | ICD-10-CM | POA: Insufficient documentation

## 2020-12-04 DIAGNOSIS — Z885 Allergy status to narcotic agent status: Secondary | ICD-10-CM | POA: Insufficient documentation

## 2020-12-04 DIAGNOSIS — Z8673 Personal history of transient ischemic attack (TIA), and cerebral infarction without residual deficits: Secondary | ICD-10-CM | POA: Diagnosis not present

## 2020-12-04 DIAGNOSIS — K649 Unspecified hemorrhoids: Secondary | ICD-10-CM | POA: Diagnosis not present

## 2020-12-04 DIAGNOSIS — Z8719 Personal history of other diseases of the digestive system: Secondary | ICD-10-CM | POA: Insufficient documentation

## 2020-12-04 DIAGNOSIS — E039 Hypothyroidism, unspecified: Secondary | ICD-10-CM | POA: Insufficient documentation

## 2020-12-04 DIAGNOSIS — I130 Hypertensive heart and chronic kidney disease with heart failure and stage 1 through stage 4 chronic kidney disease, or unspecified chronic kidney disease: Secondary | ICD-10-CM | POA: Insufficient documentation

## 2020-12-04 DIAGNOSIS — Z8249 Family history of ischemic heart disease and other diseases of the circulatory system: Secondary | ICD-10-CM | POA: Insufficient documentation

## 2020-12-04 DIAGNOSIS — I8500 Esophageal varices without bleeding: Secondary | ICD-10-CM

## 2020-12-04 HISTORY — PX: ESOPHAGEAL BANDING: SHX5518

## 2020-12-04 HISTORY — PX: BIOPSY: SHX5522

## 2020-12-04 HISTORY — PX: COLONOSCOPY WITH PROPOFOL: SHX5780

## 2020-12-04 HISTORY — PX: HOT HEMOSTASIS: SHX5433

## 2020-12-04 HISTORY — PX: ESOPHAGOGASTRODUODENOSCOPY (EGD) WITH PROPOFOL: SHX5813

## 2020-12-04 LAB — COMPREHENSIVE METABOLIC PANEL
ALT: 44 U/L (ref 0–44)
AST: 52 U/L — ABNORMAL HIGH (ref 15–41)
Albumin: 3.6 g/dL (ref 3.5–5.0)
Alkaline Phosphatase: 154 U/L — ABNORMAL HIGH (ref 38–126)
Anion gap: 9 (ref 5–15)
BUN: 15 mg/dL (ref 8–23)
CO2: 23 mmol/L (ref 22–32)
Calcium: 9.2 mg/dL (ref 8.9–10.3)
Chloride: 103 mmol/L (ref 98–111)
Creatinine, Ser: 0.83 mg/dL (ref 0.44–1.00)
GFR, Estimated: >60 mL/min (ref >60)
Glucose, Bld: 90 mg/dL (ref 70–99)
Potassium: 3.7 mmol/L (ref 3.5–5.1)
Sodium: 135 mmol/L (ref 135–145)
Total Bilirubin: 1.2 mg/dL (ref 0.3–1.2)
Total Protein: 7.6 g/dL (ref 6.5–8.1)

## 2020-12-04 LAB — CBC
HCT: 36.5 % (ref 36.0–46.0)
Hemoglobin: 10.9 g/dL — ABNORMAL LOW (ref 12.0–15.0)
MCH: 26.3 pg (ref 26.0–34.0)
MCHC: 29.9 g/dL — ABNORMAL LOW (ref 30.0–36.0)
MCV: 88 fL (ref 80.0–100.0)
Platelets: 237 10*3/uL (ref 150–400)
RBC: 4.15 MIL/uL (ref 3.87–5.11)
WBC: 4.5 10*3/uL (ref 4.0–10.5)
nRBC: 0 % (ref 0.0–0.2)

## 2020-12-04 LAB — PROTIME-INR
INR: 1.1 (ref 0.8–1.2)
Prothrombin Time: 14.1 seconds (ref 11.4–15.2)

## 2020-12-04 SURGERY — ESOPHAGOGASTRODUODENOSCOPY (EGD) WITH PROPOFOL
Anesthesia: Monitor Anesthesia Care

## 2020-12-04 MED ORDER — LACTATED RINGERS IV SOLN: INTRAVENOUS | Status: DC | PRN

## 2020-12-04 MED ORDER — SODIUM CHLORIDE 0.9 % IV SOLN: INTRAVENOUS | Status: DC

## 2020-12-04 MED ORDER — ESMOLOL HCL 100 MG/10ML IV SOLN
INTRAVENOUS | Status: DC | PRN
  Administered 2020-12-04: 30 mg via INTRAVENOUS

## 2020-12-04 MED ORDER — PROPOFOL 500 MG/50ML IV EMUL
INTRAVENOUS | Status: DC | PRN
  Administered 2020-12-04: 135 ug/kg/min via INTRAVENOUS

## 2020-12-04 MED ORDER — PROPOFOL 500 MG/50ML IV EMUL
INTRAVENOUS | Status: DC | PRN
  Administered 2020-12-04: 30 mg via INTRAVENOUS

## 2020-12-04 SURGICAL SUPPLY — 25 items

## 2020-12-04 NOTE — Op Note (Addendum)
[facility] Patient Name: Stephanie Love Procedure Date: 12/04/2020 MRN: [id number] Attending MD: Jackquline Denmark , MD Date of Birth: 1936-04-19 CSN: [id number] Age: 85 Admit Type: Outpatient Procedure:                Upper GI endoscopy Indications:              #1. 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 May                            have some autoimmune component. Mild splenomegaly.                            No thrombocytopenia, coagulopathy, HE and jaundice.                           #2. Heme pos stools 07/2020.                           #3. Chronic IDA, S/P multiple iron infusions. Providers:                Jackquline Denmark, MD, Burtis Junes, RN, Tyna Jaksch                            Technician Referring MD:             Dr Marco Collie. Medicines:                Monitored Anesthesia Care Complications:            No immediate complications. Estimated Blood Loss:     Estimated blood loss: none. Procedure:                Pre-Anesthesia Assessment:                           - Prior to the procedure, a History and Physical                            was performed, and patient medications and                            allergies were reviewed. The patient's tolerance of                            previous anesthesia was also reviewed. The risks                            and benefits of the procedure and the sedation                            options and risks were discussed with the patient.                            All questions were answered, and informed consent  was obtained. Prior Anticoagulants: The patient has                            taken no previous anticoagulant or antiplatelet                            agents. ASA Grade Assessment: III - A patient with                            severe systemic disease. After reviewing the risks                            and benefits, the patient was deemed in                             satisfactory condition to undergo the procedure.                           After obtaining informed consent, the endoscope was                            passed under direct vision. Throughout the                            procedure, the patient's blood pressure, pulse, and                            oxygen saturations were monitored continuously. The                            GIF-H190 ([id number]) Olympus gastroscope was                            introduced through the mouth, and advanced to the                            second part of duodenum. The upper GI endoscopy was                            accomplished without difficulty. The patient                            tolerated the procedure well. Scope In: Scope Out: Findings:      2 channels of Grade II varices were found in the lower third of the       esophagus. They were 4 mm in largest diameter. Three bands were       successfully placed on the larger varices with complete eradication,       resulting in deflation of varices. There was no bleeding during and at       the end of the procedure.      Mild portal hypertensive gastropathy was found in the gastric body.       Several small 4 to 6 mm polyps (previously determined to be  hyperplastic) were noted in the body and fundus.      The examined duodenum was normal. Biopsies for histology were taken with       a cold forceps for evaluation of celiac disease. Impression:               - Grade II esophageal varices. Completely                            eradicated. Banded.                           - Portal hypertensive gastropathy.                           - Normal examined duodenum. Biopsied. Moderate Sedation:      Not Applicable - Patient had care per Anesthesia. Recommendation:           - Patient has a contact number available for                            emergencies. The signs and symptoms of potential                            delayed complications were  discussed with the                            patient. Return to normal activities tomorrow.                            Written discharge instructions were provided to the                            patient.                           - Resume previous diet.                           - Continue present medications.                           - Await pathology results.                           - No aspirin, ibuprofen, naproxen, or other                            non-steroidal anti-inflammatory drugs.                           - Repeat upper endoscopy in 4-6 weeks for                            retreatment.                           - Return to GI office PRN. Procedure Code(s):        ---  Professional ---                           [phone], Esophagogastroduodenoscopy, flexible,                            transoral; with band ligation of esophageal/gastric                            varices                           43239, Esophagogastroduodenoscopy, flexible,                            transoral; with biopsy, single or multiple Diagnosis Code(s):        --- Professional ---                           I85.00, Esophageal varices without bleeding                           K76.6, Portal hypertension                           K31.89, Other diseases of stomach and duodenum                           D50.9, Iron deficiency anemia, unspecified CPT copyright 2019 [employer]. All rights reserved. The codes documented in this report are preliminary and upon coder review may  be revised to meet current compliance requirements. Jackquline Denmark, MD 12/04/2020 9:46:35 AM This report has been signed electronically. Number of Addenda: 0

## 2020-12-04 NOTE — Discharge Instructions (Signed)
YOU HAD AN ENDOSCOPIC PROCEDURE TODAY: Refer to the procedure report and other information in the discharge instructions given to you for any specific questions about what was found during the examination. If this information does not answer your questions, please call [place] office at [phone] to clarify.  ° °YOU SHOULD EXPECT: Some feelings of bloating in the abdomen. Passage of more gas than usual. Walking can help get rid of the air that was put into your GI tract during the procedure and reduce the bloating. If you had a lower endoscopy (such as a colonoscopy or flexible sigmoidoscopy) you may notice spotting of blood in your stool or on the toilet paper. Some abdominal soreness may be present for a day or two, also. ° °DIET: Your first meal following the procedure should be a light meal and then it is ok to progress to your normal diet. A half-sandwich or bowl of soup is an example of a good first meal. Heavy or fried foods are harder to digest and may make you feel nauseous or bloated. Drink plenty of fluids but you should avoid alcoholic beverages for 24 hours. If you had a esophageal dilation, please see attached instructions for diet.   ° °ACTIVITY: Your care partner should take you home directly after the procedure. You should plan to take it easy, moving slowly for the rest of the day. You can resume normal activity the day after the procedure however YOU SHOULD NOT DRIVE, use power tools, machinery or perform tasks that involve climbing or major physical exertion for 24 hours (because of the sedation medicines used during the test).  ° °SYMPTOMS TO REPORT IMMEDIATELY: °A gastroenterologist can be reached at any hour. Please call [phone]  for any of the following symptoms:  °Following lower endoscopy (colonoscopy, flexible sigmoidoscopy) °Excessive amounts of blood in the stool  °Significant tenderness, worsening of abdominal pains  °Swelling of the abdomen that is new, acute  °Fever of 100° or  higher  °Following upper endoscopy (EGD, EUS, ERCP, esophageal dilation) °Vomiting of blood or coffee ground material  °New, significant abdominal pain  °New, significant chest pain or pain under the shoulder blades  °Painful or persistently difficult swallowing  °New shortness of breath  °Black, tarry-looking or red, bloody stools ° °FOLLOW UP:  °If any biopsies were taken you will be contacted by phone or by letter within the next 1-3 weeks. Call [phone]  if you have not heard about the biopsies in 3 weeks.  °Please also call with any specific questions about appointments or follow up tests. ° °

## 2020-12-04 NOTE — H&P (Signed)
For EGD/colon today Explained risks and benefits RG      Chief Complaint:   Referring Provider:  Marco Collie, MD        ASSESSMENT AND PLAN;    #1. 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  May have some autoimmune component.  Mild splenomegaly. No thrombocytopenia, coagulopathy, HE and jaundice.   #2.  Heme pos stools 07/2020.   #3.  Peripheral edema but with ascites (resolved). No CHF on recent echo.   #4.  Chronic IDA, S/P multiple iron infusions. Multifactorial d/t hypersplenism, decreased iron intake, slow GI bleeding related to portal hypertensive gastropathy/early GAVE, mild chronic renal insufficiency. Neg colonoscopy Jan 2016 and CT AP 09/2016   Plan: -Continue Pravacid 91m po qd -We will continue Lasix 20 mg p.o. once a day -Low-salt diet. [facility] -CBC, CMP, PT INR, AFP at time of endo procedures.   Discussed risks & benefits. Risks including rare perforation req laparotomy, bleeding after bx/polypectomy req blood transfusion, rarely missing neoplasms, risks of anesthesia/sedation. Benefits outweigh the risks. Patient agrees to proceed. All the questions were answered. Consent forms given for review.     HPI:     Stephanie PASSONis a 85y.o. female  With H/O CHF, COPD, chronic anemia, NASH cirrhosis, GERD, HLD, HTN, CVA, CKD2, S/P cholecystectomy 2013, vaginal hysterectomy   With nonalcoholic liver cirrhosis - Dx on CT Oct 2016 with longstanding H/O fatty liver. -Neg acute hepatitis panel, celiac screen, positive ANA, positive AMA (132), positive ASMA (81).  May have some autoimmune component.  Refused liver biopsy. -S/P vaccines for hepatitis a and B. -EGD 10/2015 did not show any esophageal varices.  However, had portal hypertensive gastropathy ?Early GAVE. -Negative colonoscopy January 2016 except for colonic polyps s/p polypectomy, moderate predominantly sigmoid diverticulosis.  Highly redundant colon.  Internal hemorrhoids.  Bx- TAs.   Hold off on routine colon d/t age -CT AP 09/2016: Cirrhosis without ascites or splenomegaly. [place] Liver cirrhosis, borderline splenomegaly measuring 12 cm.  No focal lesions. -[place]: Cirrhosis, no focal hepatic lesion, ascites, mild splenomegaly.   With chronic IDA-thought to be multifactorial d/t hypersplenism, decreased iron intake, slow GI bleeding related to portal hypertensive gastropathy, mild chronic renal insufficiency.     Recently had heme positive stools 08/16/2020 with Hb 8, s/p IV Fe.  She did have fatigue, weakness.  Has felt much better after iron infusion.   Sent to GI clinic for further evaluation.     Her ankle swelling has resolved on Lasix.   She had 2D echo 10/2018 which showed ejection fraction of 60 to 65%.   Has been having increasing constipation.  No change in mental status.  Has history of easy bruisability and has been taken off aspirin which helped.   Heartburn is under good control with Prevacid.  She denies having any nausea, vomiting, hematemesis, melena, recent weight loss or loss of appetite.   No sodas, chocolates, chewing gums, artificial sweeteners and candy. No NSAIDs     2DE 10/2018: ejection  fraction of 60-65%     Past Medical History:  Diagnosis Date   Anemia     Arthritis     [facility]immune hepatitis [facility]     Cirrhosis ([facility])     Congestive heart failure (HCC)     COPD (chronic obstructive pulmonary disease) (HCC)     Depression     Diverticulosis     GERD (gastroesophageal reflux disease)     Hepatitis A virus  infection 1963   History of colon polyps     History of colon polyps     Hyperlipidemia     Hypertension     Hypothyroid     IDA (iron deficiency anemia)      Does iron infusion   Microhematuria     NASH (nonalcoholic steatohepatitis)     Neuropathy     Osteoarthritis     Rectal prolapse     Stage 2 chronic kidney disease     Stroke ([place]) 11/13/2018   UTI (urinary tract infection)             Past Surgical  History:  Procedure Laterality Date   ABDOMINAL HYSTERECTOMY   2012   AMPUTATION TOE        right 2nd toe   BREAST SURGERY        Breast tumor removed non-malignant   BUNIONECTOMY   05/01/2016   CHOLECYSTECTOMY   2013   COLONOSCOPY   07/12/2014    Colonic polyps status post polypectomy. Moderate predominanlty sigmoid diverticulosis. High redundant colon.   ESOPHAGOGASTRODUODENOSCOPY   11/14/2015    Mild Candida esophagitis. Mild gastritis. Incidental gastic polyps (status post polypectomy x2)   LOOP RECORDER INSERTION N/A 11/16/2018    Procedure: LOOP RECORDER INSERTION;  Surgeon: Thompson Grayer, MD;  Location: [facility];  Service: Cardiovascular;  Laterality: N/A;   PARTIAL HIP ARTHROPLASTY Left 12/2018    [facility]   vaginal lesion removed        Precancerous           Family History  Problem Relation Age of Onset   Cancer Mother     Diabetes Father     Heart disease Father        Social History         Tobacco Use   Smoking status: Former Smoker      Types: Cigarettes      Quit date: 11/16/1981      Years since quitting: 38.9   Smokeless tobacco: Never Used  Vaping Use   Vaping Use: Never used  Substance Use Topics   Alcohol use: Not Currently   Drug use: Never            Current Outpatient Medications  Medication Sig Dispense Refill   alendronate (FOSAMAX) 70 MG tablet Take 70 mg by mouth once a week.       arformoterol (BROVANA) 15 MCG/2ML NEBU Take 15 mcg by nebulization 2 (two) times daily.       budesonide (PULMICORT) 0.5 MG/2ML nebulizer solution Take 0.5 mg by nebulization 2 (two) times daily.       escitalopram (LEXAPRO) 10 MG tablet Take 10 mg by mouth daily.       furosemide (LASIX) 20 MG tablet TAKE 1 TABLET BY MOUTH DAILY. TAKE DAILY UNTIL SWELLING IS DOWN THEN 1 TABLET EVERY OTHER DAY 30 tablet 0   lansoprazole (PREVACID) 15 MG capsule Take 30 mg by mouth daily at 12 noon.       levothyroxine (SYNTHROID, LEVOTHROID) 112 MCG tablet  Take 112 mcg by mouth daily before breakfast.       metoprolol succinate (TOPROL-XL) 25 MG 24 hr tablet Take 1 tablet (25 mg total) by mouth daily. 30 tablet 1   revefenacin (YUPELRI) 175 MCG/3ML nebulizer solution Take 175 mcg by nebulization daily.        No current facility-administered medications for this visit.  Allergies  Allergen Reactions   Penicillins Anaphylaxis      Did it involve swelling of the face/tongue/throat, SOB, or low BP? yes Did it involve sudden or severe rash/hives, skin peeling, or any reaction on the inside of your mouth or nose? no Did you need to seek medical attention at a hospital or doctor's office? yes When did it last happen?    pt was 22 If all above answers are "NO", may proceed with cephalosporin use.     Codeine        hyper      Review of Systems:  neg       Physical Exam:     BP 140/70   Pulse 74   Ht 5' 2" (1.575 m)   Wt 134 lb 8 oz (61 kg)   BMI 24.60 kg/m     Wt Readings from Last 3 Encounters:  10/01/20 134 lb 8 oz (61 kg)  06/08/20 140 lb 2 oz (63.6 kg)  11/24/19 129 lb (58.5 kg)    Constitutional:  Well-developed, in no acute distress. Psychiatric: Normal mood and affect. Behavior is normal. HEENT: Pupils normal.  Conjunctivae are normal. No scleral icterus. Neck supple. Cardiovascular: Normal rate, regular rhythm. No edema Pulmonary/chest: Effort normal and breath sounds normal. No wheezing, rales or rhonchi. Abdominal: Soft, nondistended. Nontender. Bowel sounds active throughout. There are no masses palpable. No hepatomegaly. Rectal: Deferred Neurological: Alert and oriented to person place and time. Skin: Skin is warm and dry. No rashes noted.   Data Reviewed: I have personally reviewed following labs and imaging studies   CBC: CBC Latest Ref Rng & Units 06/14/2020 11/29/2018 11/22/2018  WBC 3.4 - 10.8 x10E3/uL 5.6 7.3 5.5  Hemoglobin 11.1 - 15.9 g/dL 10.1(L) 8.4(L) 7.4(L)  Hematocrit 34.0 - 46.6 % 32.8(L)  30.2(L) 26.9(L)  Platelets 150 - 450 x10E3/uL 332 442(H) 272      CMP: CMP Latest Ref Rng & Units 06/14/2020 11/24/2018 11/17/2018  Glucose 65 - 99 mg/dL 106(H) 87 162(H)  BUN 8 - 27 mg/dL _0 Creatinine 0.57 - 1.00 mg/dL 0.89 0.90 0.88  Sodium 134 - 144 mmol/L 139 137 135  Potassium 3.5 - 5.2 mmol/L 4.0 4.1 3.5  Chloride 96 - 106 mmol/L 105 107 105  CO2 20 - 29 mmol/L _1 Calcium 8.7 - 10.3 mg/dL 9.2 9.6 9.2  Total Protein 6.0 - 8.5 g/dL 7.0 - 5.8(L)  Total Bilirubin 0.0 - 1.2 mg/dL 0.4 - 0.5  Alkaline Phos 44 - 121 IU/L 133(H) - 75  AST 0 - 40 IU/L 40 - 24  ALT 0 - 32 IU/L 34(H) - 19    Hepatic Function Latest Ref Rng & Units 06/14/2020 11/17/2018  Total Protein 6.0 - 8.5 g/dL 7.0 5.8(L)  Albumin 3.6 - 4.6 g/dL 3.7 2.7(L)  AST 0 - 40 IU/L 40 24  ALT 0 - 32 IU/L 34(H) 19  Alk Phosphatase 44 - 121 IU/L 133(H) 75  Total Bilirubin 0.0 - 1.2 mg/dL 0.4 0.5          Radiology Studies:  Imaging Results  CUP PACEART REMOTE DEVICE CHECK   Result Date: 09/16/2020 ILR summary report received. Battery status OK. Normal device function. No new symptom, tachy, brady, or pause episodes. No new AF episodes. Monthly summary reports and ROV/PRN Kathy Breach, RN, CCDS, [employer]        Carmell Austria, MD

## 2020-12-04 NOTE — Anesthesia Postprocedure Evaluation (Signed)
Anesthesia Post Note  Patient: TALINA PLEITEZ  Procedure(s) Performed: ESOPHAGOGASTRODUODENOSCOPY (EGD) WITH PROPOFOL COLONOSCOPY WITH PROPOFOL BIOPSY ESOPHAGEAL BANDING HOT HEMOSTASIS (ARGON PLASMA COAGULATION/BICAP)     Patient location during evaluation: Endoscopy Anesthesia Type: MAC Level of consciousness: awake and alert Pain management: pain level controlled Vital Signs Assessment: post-procedure vital signs reviewed and stable Respiratory status: spontaneous breathing, nonlabored ventilation, respiratory function stable and patient connected to nasal cannula oxygen Cardiovascular status: blood pressure returned to baseline and stable Postop Assessment: no apparent nausea or vomiting Anesthetic complications: no   No notable events documented.  Last Vitals:  Vitals:   12/04/20 1000 12/04/20 1013  BP:  (!) 171/63  Pulse: 77 80  Resp: 15 13  Temp:    SpO2: 97% 99%    Last Pain:  Vitals:   12/04/20 1013  TempSrc:   PainSc: 5                  Barnet Glasgow

## 2020-12-04 NOTE — Transfer of Care (Signed)
Immediate Anesthesia Transfer of Care Note  Patient: Stephanie Love  Procedure(s) Performed: Procedure(s): ESOPHAGOGASTRODUODENOSCOPY (EGD) WITH PROPOFOL (N/A) COLONOSCOPY WITH PROPOFOL (N/A) BIOPSY ESOPHAGEAL BANDING HOT HEMOSTASIS (ARGON PLASMA COAGULATION/BICAP) (N/A)  Patient Location: PACU  Anesthesia Type:MAC  Level of Consciousness:  sedated, patient cooperative and responds to stimulation  Airway & Oxygen Therapy:Patient Spontanous Breathing and Patient connected to face mask oxgen  Post-op Assessment:  Report given to PACU RN and Post -op Vital signs reviewed and stable  Post vital signs:  Reviewed and stable  Last Vitals:  Vitals:   12/04/20 0838  BP: (!) 150/89  Pulse: 77  Resp: 16  Temp: 37 C  SpO2: 09%    Complications: No apparent anesthesia complications

## 2020-12-04 NOTE — Op Note (Signed)
[facility] Patient Name: Stephanie Love Procedure Date: 12/04/2020 MRN: [id number] Attending MD: Jackquline Denmark , MD Date of Birth: [birth year] CSN: [id number] Age: 85 Admit Type: Outpatient Procedure:                Colonoscopy Indications:              #1. 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 May                            have some autoimmune component. Mild splenomegaly.                            No thrombocytopenia, coagulopathy, HE and jaundice.                           #2. Heme pos stools 07/2020.                           #3. Chronic IDA, S/P multiple iron infusions. Providers:                Jackquline Denmark, MD, Burtis Junes, RN, Tyna Jaksch                            Technician Referring MD:             Dr Marco Collie Medicines:                Monitored Anesthesia Care Complications:            No immediate complications. Estimated Blood Loss:     Estimated blood loss: none. Procedure:                Pre-Anesthesia Assessment:                           - Prior to the procedure, a History and Physical                            was performed, and patient medications and                            allergies were reviewed. The patient's tolerance of                            previous anesthesia was also reviewed. The risks                            and benefits of the procedure and the sedation                            options and risks were discussed with the patient.                            All questions were answered, and informed consent                            was  obtained. Prior Anticoagulants: The patient has                            taken no previous anticoagulant or antiplatelet                            agents. ASA Grade Assessment: III - A patient with                            severe systemic disease. After reviewing the risks                            and benefits, the patient was deemed in                            satisfactory  condition to undergo the procedure.                           After obtaining informed consent, the colonoscope                            was passed under direct vision. Throughout the                            procedure, the patient's blood pressure, pulse, and                            oxygen saturations were monitored continuously. The                            PCF-H190DL ([id number]) Olympus pediatric colonscope                            was introduced through the anus and advanced to the                            2 cm into the ileum. The colonoscopy was performed                            without difficulty. The patient tolerated the                            procedure well. The quality of the bowel                            preparation was good. The terminal ileum, ileocecal                            valve, appendiceal orifice, and rectum were                            photographed. Scope In: 9:15:59 AM Scope Out: 9:37:50 AM Scope Withdrawal Time: 0 hours 13 minutes 53 seconds  Total Procedure Duration: 0 hours 21  minutes 51 seconds  Findings:      Three large angiodysplastic lesions without bleeding were found in the       mid ascending colon and in the cecum(2). Coagulation for using argon       plasma at 0.3 liters/minute and 20 watts was successful.      Multiple medium-mouthed diverticula were found in the sigmoid colon.      Non-bleeding internal hemorrhoids were found during retroflexion. The       hemorrhoids were moderate.      The terminal ileum appeared normal.      The exam was otherwise without abnormality on direct and retroflexion       views. Impression:               - Three non-bleeding colonic angiodysplastic                            lesions. Treated with argon plasma coagulation                            (APC).                           - Diverticulosis in the sigmoid colon.                           - Non-bleeding internal hemorrhoids.                            - The examined portion of the ileum was normal.                           - The examination was otherwise normal on direct                            and retroflexion views.                           - No specimens collected. Moderate Sedation:      Not Applicable - Patient had care per Anesthesia. Recommendation:           - Patient has a contact number available for                            emergencies. The signs and symptoms of potential                            delayed complications were discussed with the                            patient. Return to normal activities tomorrow.                            Written discharge instructions were provided to the                            patient.                           -  Resume previous diet.                           - Continue present medications.                           - The findings and recommendations were discussed                            with the patient's family.                           - Avoid nonsteroidals.                           - Trend CBC and transfuse/give iron as needed. Procedure Code(s):        --- Professional ---                           [id number], Colonoscopy, flexible; diagnostic, including                            collection of specimen(s) by brushing or washing,                            when performed (separate procedure) Diagnosis Code(s):        --- Professional ---                           H74.14, Angiodysplasia of colon without hemorrhage                           K64.8, Other hemorrhoids                           R19.5, Other fecal abnormalities                           K57.30, Diverticulosis of large intestine without                            perforation or abscess without bleeding CPT copyright 2019 [employer]. All rights reserved. The codes documented in this report are preliminary and upon coder review may  be revised to meet current compliance  requirements. Jackquline Denmark, MD 12/04/2020 9:55:27 AM This report has been signed electronically. Number of Addenda: 0

## 2020-12-05 LAB — SURGICAL PATHOLOGY

## 2020-12-05 LAB — AFP TUMOR MARKER: AFP, Serum, Tumor Marker: 1.8 ng/mL (ref 0.0–8.7)

## 2020-12-06 ENCOUNTER — Encounter (HOSPITAL_COMMUNITY): Payer: Self-pay | Admitting: Gastroenterology

## 2020-12-13 NOTE — Progress Notes (Signed)
Carelink Summary Report / Loop Recorder 

## 2020-12-21 ENCOUNTER — Ambulatory Visit (INDEPENDENT_AMBULATORY_CARE_PROVIDER_SITE_OTHER): Payer: Medicare Other

## 2020-12-21 DIAGNOSIS — F329 Major depressive disorder, single episode, unspecified: Secondary | ICD-10-CM | POA: Diagnosis not present

## 2020-12-21 DIAGNOSIS — I639 Cerebral infarction, unspecified: Secondary | ICD-10-CM

## 2020-12-21 DIAGNOSIS — J449 Chronic obstructive pulmonary disease, unspecified: Secondary | ICD-10-CM | POA: Diagnosis not present

## 2020-12-21 DIAGNOSIS — I110 Hypertensive heart disease with heart failure: Secondary | ICD-10-CM | POA: Diagnosis not present

## 2020-12-26 LAB — CUP PACEART REMOTE DEVICE CHECK
Date Time Interrogation Session: 20220630011527
Implantable Pulse Generator Implant Date: 20200526

## 2020-12-27 DIAGNOSIS — Z6824 Body mass index (BMI) 24.0-24.9, adult: Secondary | ICD-10-CM | POA: Diagnosis not present

## 2020-12-27 DIAGNOSIS — S81802S Unspecified open wound, left lower leg, sequela: Secondary | ICD-10-CM | POA: Diagnosis not present

## 2020-12-27 DIAGNOSIS — S81801A Unspecified open wound, right lower leg, initial encounter: Secondary | ICD-10-CM | POA: Diagnosis not present

## 2020-12-27 DIAGNOSIS — K7460 Unspecified cirrhosis of liver: Secondary | ICD-10-CM | POA: Diagnosis not present

## 2020-12-31 DIAGNOSIS — S81812S Laceration without foreign body, left lower leg, sequela: Secondary | ICD-10-CM | POA: Diagnosis not present

## 2020-12-31 DIAGNOSIS — S81802S Unspecified open wound, left lower leg, sequela: Secondary | ICD-10-CM | POA: Diagnosis not present

## 2020-12-31 DIAGNOSIS — S81801S Unspecified open wound, right lower leg, sequela: Secondary | ICD-10-CM | POA: Diagnosis not present

## 2021-01-07 DIAGNOSIS — D509 Iron deficiency anemia, unspecified: Secondary | ICD-10-CM | POA: Diagnosis not present

## 2021-01-07 DIAGNOSIS — S81812S Laceration without foreign body, left lower leg, sequela: Secondary | ICD-10-CM | POA: Diagnosis not present

## 2021-01-07 DIAGNOSIS — I8510 Secondary esophageal varices without bleeding: Secondary | ICD-10-CM | POA: Diagnosis not present

## 2021-01-07 DIAGNOSIS — K7460 Unspecified cirrhosis of liver: Secondary | ICD-10-CM | POA: Diagnosis not present

## 2021-01-09 DIAGNOSIS — B029 Zoster without complications: Secondary | ICD-10-CM | POA: Diagnosis not present

## 2021-01-09 DIAGNOSIS — Z6824 Body mass index (BMI) 24.0-24.9, adult: Secondary | ICD-10-CM | POA: Diagnosis not present

## 2021-01-09 DIAGNOSIS — I110 Hypertensive heart disease with heart failure: Secondary | ICD-10-CM | POA: Diagnosis not present

## 2021-01-09 NOTE — Progress Notes (Signed)
Carelink Summary Report / Loop Recorder 

## 2021-01-15 ENCOUNTER — Telehealth: Payer: Self-pay | Admitting: Gastroenterology

## 2021-01-15 NOTE — Telephone Encounter (Signed)
Inbound call from patient. States she received a EDG 12/04/20 and was told to return in 4-6 for a repeat. Could you verify? Best contact patient [phone]

## 2021-01-15 NOTE — Telephone Encounter (Signed)
Spoke with patient. Patient was concerned about having repeat EGD. Per Rec pt is to repeat EGD in 4-6 weeks. Last EGD 12/04/20. Patient states she is 85 years old and is this really necessary to have done. Please advise.

## 2021-01-21 DIAGNOSIS — F329 Major depressive disorder, single episode, unspecified: Secondary | ICD-10-CM | POA: Diagnosis not present

## 2021-01-21 DIAGNOSIS — J449 Chronic obstructive pulmonary disease, unspecified: Secondary | ICD-10-CM | POA: Diagnosis not present

## 2021-01-21 DIAGNOSIS — I110 Hypertensive heart disease with heart failure: Secondary | ICD-10-CM | POA: Diagnosis not present

## 2021-01-21 NOTE — Telephone Encounter (Signed)
Normally we do recommend EGD in 4 to 6 weeks after banding  However, due to advanced age, lets hold off on repeat EGD for now.  We will do it if she starts having problems RG

## 2021-01-21 NOTE — Telephone Encounter (Signed)
Spoke with patient, reviewed rec;s. Patient verbalized understanding and nothing further needed at this time.

## 2021-01-21 NOTE — Telephone Encounter (Signed)
Attempted to contact patient, no answer LMTCB 

## 2021-01-22 LAB — CUP PACEART REMOTE DEVICE CHECK
Date Time Interrogation Session: 20220802011949
Implantable Pulse Generator Implant Date: 20200526

## 2021-01-23 ENCOUNTER — Ambulatory Visit (INDEPENDENT_AMBULATORY_CARE_PROVIDER_SITE_OTHER): Payer: Medicare Other

## 2021-01-23 DIAGNOSIS — I639 Cerebral infarction, unspecified: Secondary | ICD-10-CM | POA: Diagnosis not present

## 2021-01-24 DIAGNOSIS — L089 Local infection of the skin and subcutaneous tissue, unspecified: Secondary | ICD-10-CM | POA: Diagnosis not present

## 2021-01-24 DIAGNOSIS — Z6824 Body mass index (BMI) 24.0-24.9, adult: Secondary | ICD-10-CM | POA: Diagnosis not present

## 2021-01-24 DIAGNOSIS — B029 Zoster without complications: Secondary | ICD-10-CM | POA: Diagnosis not present

## 2021-01-24 DIAGNOSIS — D509 Iron deficiency anemia, unspecified: Secondary | ICD-10-CM | POA: Diagnosis not present

## 2021-02-07 DIAGNOSIS — L03119 Cellulitis of unspecified part of limb: Secondary | ICD-10-CM | POA: Diagnosis not present

## 2021-02-07 DIAGNOSIS — B0229 Other postherpetic nervous system involvement: Secondary | ICD-10-CM | POA: Diagnosis not present

## 2021-02-07 DIAGNOSIS — Z6824 Body mass index (BMI) 24.0-24.9, adult: Secondary | ICD-10-CM | POA: Diagnosis not present

## 2021-02-07 DIAGNOSIS — D509 Iron deficiency anemia, unspecified: Secondary | ICD-10-CM | POA: Diagnosis not present

## 2021-02-11 DIAGNOSIS — D509 Iron deficiency anemia, unspecified: Secondary | ICD-10-CM | POA: Diagnosis not present

## 2021-02-18 DIAGNOSIS — D509 Iron deficiency anemia, unspecified: Secondary | ICD-10-CM | POA: Diagnosis not present

## 2021-02-19 NOTE — Progress Notes (Signed)
Carelink Summary Report / Loop Recorder 

## 2021-02-21 DIAGNOSIS — J449 Chronic obstructive pulmonary disease, unspecified: Secondary | ICD-10-CM | POA: Diagnosis not present

## 2021-02-21 DIAGNOSIS — F329 Major depressive disorder, single episode, unspecified: Secondary | ICD-10-CM | POA: Diagnosis not present

## 2021-02-21 DIAGNOSIS — I110 Hypertensive heart disease with heart failure: Secondary | ICD-10-CM | POA: Diagnosis not present

## 2021-02-22 DIAGNOSIS — D509 Iron deficiency anemia, unspecified: Secondary | ICD-10-CM | POA: Diagnosis not present

## 2021-02-22 DIAGNOSIS — Z6824 Body mass index (BMI) 24.0-24.9, adult: Secondary | ICD-10-CM | POA: Diagnosis not present

## 2021-02-22 DIAGNOSIS — J449 Chronic obstructive pulmonary disease, unspecified: Secondary | ICD-10-CM | POA: Diagnosis not present

## 2021-02-22 DIAGNOSIS — K754 Autoimmune hepatitis: Secondary | ICD-10-CM | POA: Diagnosis not present

## 2021-02-22 DIAGNOSIS — Z23 Encounter for immunization: Secondary | ICD-10-CM | POA: Diagnosis not present

## 2021-02-26 ENCOUNTER — Ambulatory Visit (INDEPENDENT_AMBULATORY_CARE_PROVIDER_SITE_OTHER): Payer: Medicare Other

## 2021-02-26 DIAGNOSIS — I639 Cerebral infarction, unspecified: Secondary | ICD-10-CM | POA: Diagnosis not present

## 2021-02-26 LAB — CUP PACEART REMOTE DEVICE CHECK
Date Time Interrogation Session: 20220904012129
Implantable Pulse Generator Implant Date: 20200526

## 2021-03-07 DIAGNOSIS — M1711 Unilateral primary osteoarthritis, right knee: Secondary | ICD-10-CM | POA: Diagnosis not present

## 2021-03-07 NOTE — Progress Notes (Signed)
Carelink Summary Report / Loop Recorder 

## 2021-03-23 DIAGNOSIS — F329 Major depressive disorder, single episode, unspecified: Secondary | ICD-10-CM | POA: Diagnosis not present

## 2021-03-23 DIAGNOSIS — J449 Chronic obstructive pulmonary disease, unspecified: Secondary | ICD-10-CM | POA: Diagnosis not present

## 2021-03-23 DIAGNOSIS — E785 Hyperlipidemia, unspecified: Secondary | ICD-10-CM | POA: Diagnosis not present

## 2021-03-28 DIAGNOSIS — Z23 Encounter for immunization: Secondary | ICD-10-CM | POA: Diagnosis not present

## 2021-03-28 DIAGNOSIS — Z6824 Body mass index (BMI) 24.0-24.9, adult: Secondary | ICD-10-CM | POA: Diagnosis not present

## 2021-03-28 DIAGNOSIS — D509 Iron deficiency anemia, unspecified: Secondary | ICD-10-CM | POA: Diagnosis not present

## 2021-03-28 DIAGNOSIS — I69359 Hemiplegia and hemiparesis following cerebral infarction affecting unspecified side: Secondary | ICD-10-CM | POA: Diagnosis not present

## 2021-04-01 ENCOUNTER — Ambulatory Visit: Payer: Medicare Other

## 2021-04-03 LAB — CUP PACEART REMOTE DEVICE CHECK
Date Time Interrogation Session: 20221007022230
Implantable Pulse Generator Implant Date: 20200526

## 2021-04-16 IMAGING — MR MRI HEAD WITHOUT CONTRAST
12 of 13 series · 44 of 48 positions shown · non-contrast
Comparison: None.

CLINICAL DATA: Stroke follow-up. Dizziness. Dysconjugate gaze,
dysarthria, and left-sided ataxia. Received IV tPA.

EXAM:
MRI HEAD WITHOUT CONTRAST
TECHNIQUE: Multiplanar, multiecho pulse sequences of the brain and surrounding
structures were obtained without intravenous contrast.

[Series 5: DWI · axial · 3.0mm · 0.88mm/px · z∈[-83,+67]mm · 9 of 104 slices shown (1 of 4)]
[im 1/104]
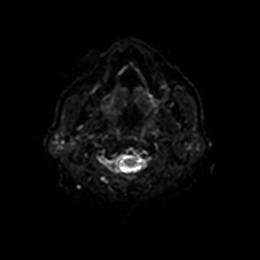
[im 13/104]
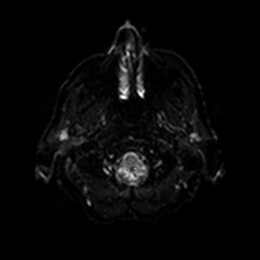
[im 26/104]
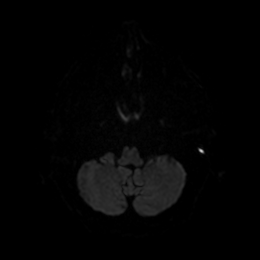
[im 39/104]
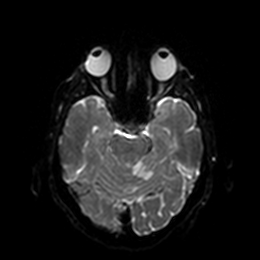
[im 52/104]
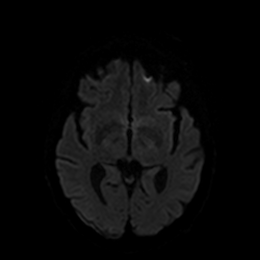
[im 65/104]
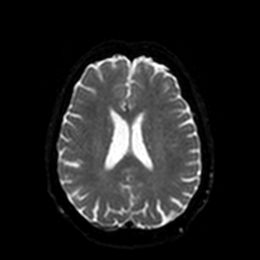
[im 78/104]
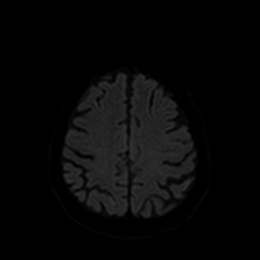
[im 91/104]
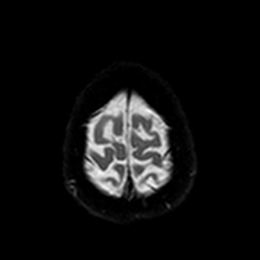
[im 104/104]
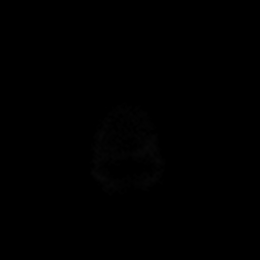

[Series 6: DWI · axial · 3.0mm · 0.88mm/px · z∈[-83,+67]mm · 4 of 52 slices shown (2 of 4)]
[im 1/52]
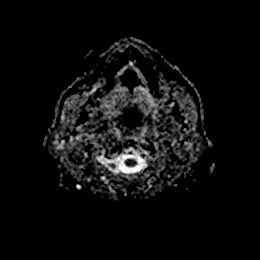
[im 18/52]
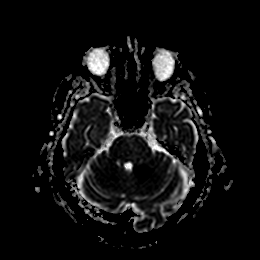
[im 35/52]
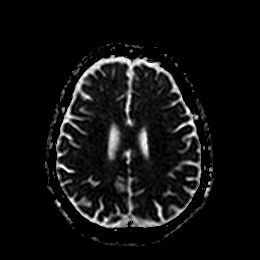
[im 52/52]
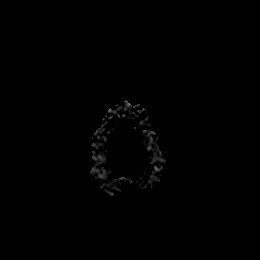

[Series 7: DWI · coronal · 4.0mm · 0.88mm/px · 5 of 72 slices shown (3 of 4)]
[im 1/72]
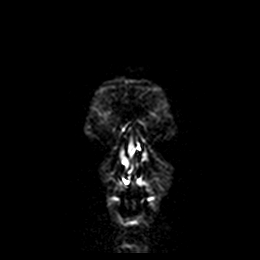
[im 18/72]
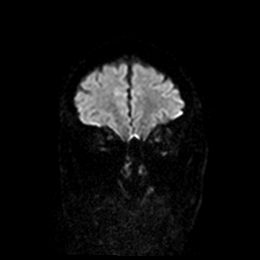
[im 36/72]
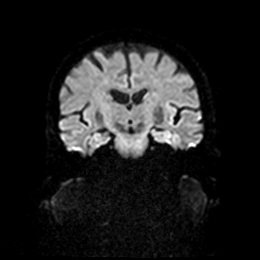
[im 54/72]
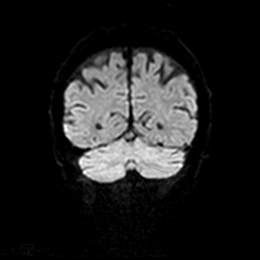
[im 72/72]
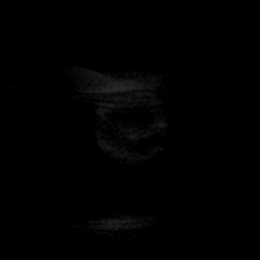

[Series 8: DWI · coronal · 4.0mm · 0.88mm/px · 3 of 36 slices shown (4 of 4)]
[im 1/36]
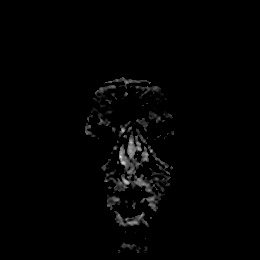
[im 18/36]
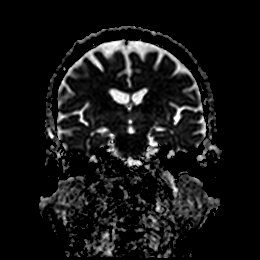
[im 36/36]
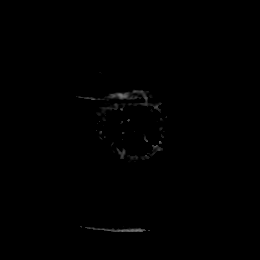

[Series 9: T1 · sagittal · 5.0mm · 0.75mm/px · 1 of 20 slices shown]
[im 1/20]
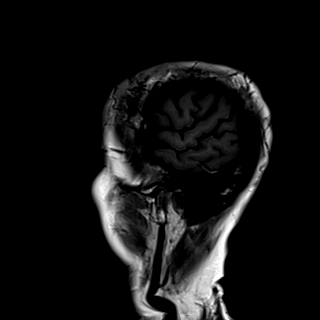

[Series 10: T2 · axial · 5.0mm · 0.72mm/px · z∈[-88,+53]mm · 2 of 25 slices shown (1 of 2)]
[im 1/25]
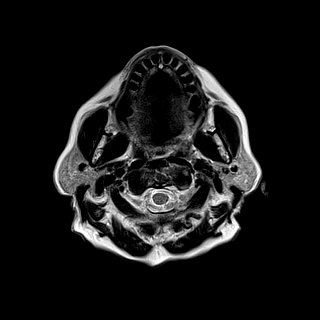
[im 25/25]
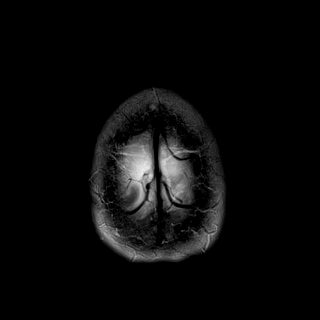

[Series 11: FLAIR · axial · 5.0mm · 0.45mm/px · z∈[-85,+55]mm · 2 of 25 slices shown]
[im 1/25]
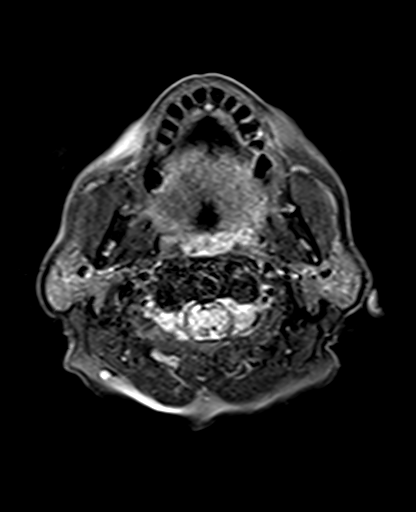
[im 25/25]
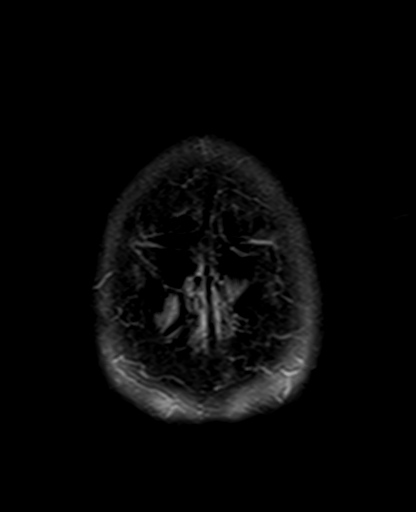

[Series 12: mag_images · axial · 3.0mm · 0.90mm/px · z∈[-93,+80]mm · 4 of 60 slices shown]
[im 1/60]
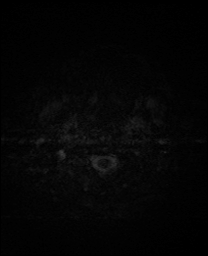
[im 20/60]
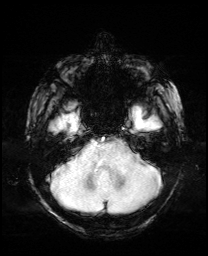
[im 40/60]
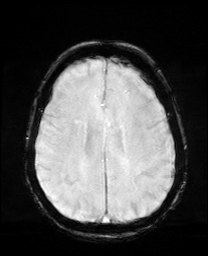
[im 60/60]
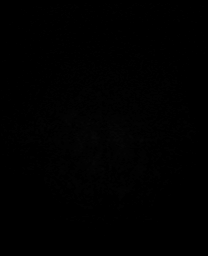

[Series 13: pha_images · axial · 3.0mm · 0.90mm/px · z∈[-93,+77]mm · 4 of 58 slices shown]
[im 1/58]
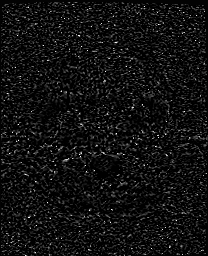
[im 20/58]
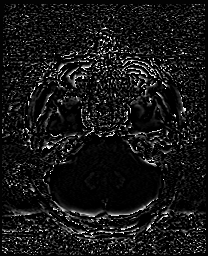
[im 39/58]
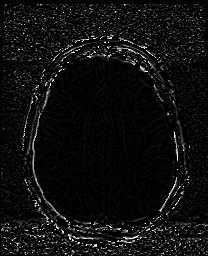
[im 58/58]
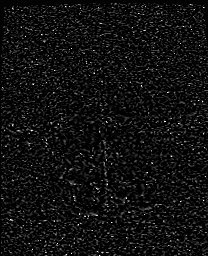

[Series 14: swi_images · axial · 3.0mm · 0.90mm/px · z∈[-93,+80]mm · 4 of 60 slices shown]
[im 1/60]
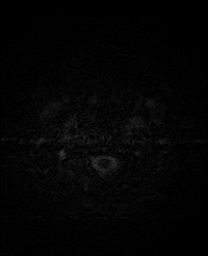
[im 20/60]
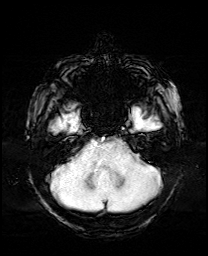
[im 40/60]
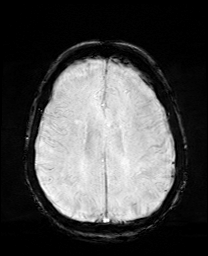
[im 60/60]
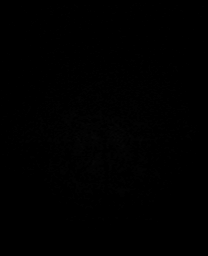

[Series 15: mip_images(sw) · axial · 24.0mm · 0.90mm/px · z∈[-83,+70]mm · 4 of 53 slices shown]
[im 1/53]
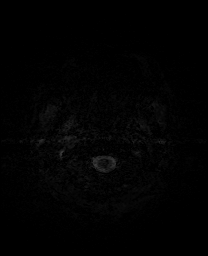
[im 18/53]
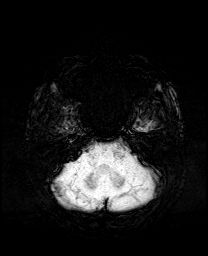
[im 35/53]
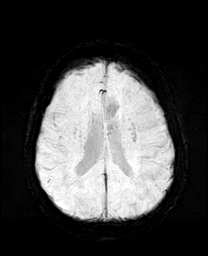
[im 53/53]
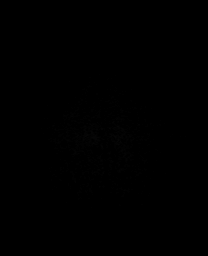

[Series 17: T2 · coronal · 5.0mm · 0.34mm/px · 2 of 29 slices shown (2 of 2)]
[im 1/29]
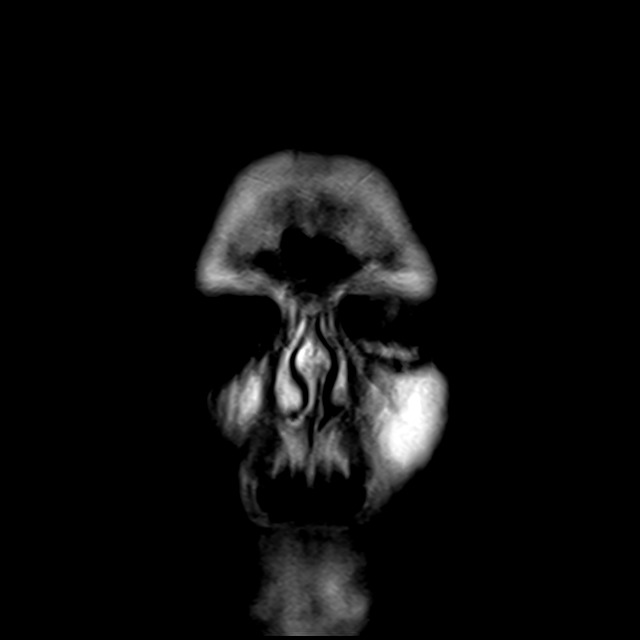
[im 29/29]
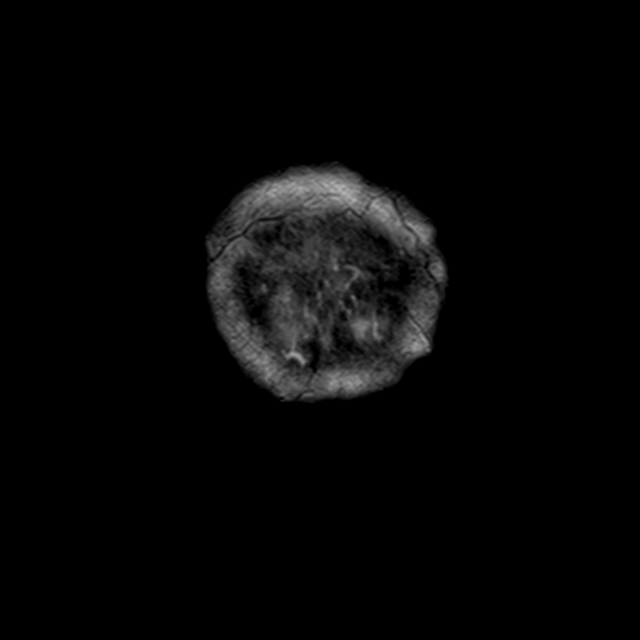

[44 of 48 positions shown; findings below may reference images not displayed]

FINDINGS: Brain: There is a 3 cm acute infarct superiorly in the left
cerebellum. Punctate acute infarcts are present involving bilateral
parietal cortex and right frontal white matter. There is small
volume subarachnoid hemorrhage in the right occipital and right
parietal lobes, and there is also a small subdural hematoma over the
posterior right cerebral convexity measuring up to 3 mm in thickness
without mass effect. The ventricles are normal in size. Minimal
chronic small vessel ischemic changes are present in the cerebral
white matter, not greater than expected for age.

Vascular: Major intracranial vascular flow voids are preserved.

Skull and upper cervical spine: No suspicious marrow lesion.

Sinuses/Orbits: Clear paranasal sinuses. Moderate right mastoid
effusion.

Other: None.
IMPRESSION: 1. Acute left superior cerebellar artery territory infarct.
2. Punctate acute bilateral parietal and right frontal infarcts.
3. Small volume right parieto-occipital subarachnoid hemorrhage and
small right-sided subdural hematoma without mass effect.

Critical Value/emergent results were called by telephone at the time
of interpretation on 11/14/2018 at [DATE] to Dr. ANDERS VETTER ,
who verbally acknowledged these results.

## 2021-04-23 DIAGNOSIS — E785 Hyperlipidemia, unspecified: Secondary | ICD-10-CM | POA: Diagnosis not present

## 2021-04-23 DIAGNOSIS — J449 Chronic obstructive pulmonary disease, unspecified: Secondary | ICD-10-CM | POA: Diagnosis not present

## 2021-04-23 DIAGNOSIS — F329 Major depressive disorder, single episode, unspecified: Secondary | ICD-10-CM | POA: Diagnosis not present

## 2021-05-02 DIAGNOSIS — F339 Major depressive disorder, recurrent, unspecified: Secondary | ICD-10-CM | POA: Diagnosis not present

## 2021-05-02 DIAGNOSIS — D509 Iron deficiency anemia, unspecified: Secondary | ICD-10-CM | POA: Diagnosis not present

## 2021-05-02 DIAGNOSIS — K5909 Other constipation: Secondary | ICD-10-CM | POA: Diagnosis not present

## 2021-05-02 LAB — CUP PACEART REMOTE DEVICE CHECK
Date Time Interrogation Session: 20221109012156
Implantable Pulse Generator Implant Date: 20200526

## 2021-05-06 ENCOUNTER — Ambulatory Visit (INDEPENDENT_AMBULATORY_CARE_PROVIDER_SITE_OTHER): Payer: Medicare Other

## 2021-05-06 DIAGNOSIS — I639 Cerebral infarction, unspecified: Secondary | ICD-10-CM | POA: Diagnosis not present

## 2021-05-09 DIAGNOSIS — L03115 Cellulitis of right lower limb: Secondary | ICD-10-CM | POA: Diagnosis not present

## 2021-05-09 DIAGNOSIS — K7460 Unspecified cirrhosis of liver: Secondary | ICD-10-CM | POA: Diagnosis not present

## 2021-05-09 DIAGNOSIS — Z6825 Body mass index (BMI) 25.0-25.9, adult: Secondary | ICD-10-CM | POA: Diagnosis not present

## 2021-05-09 DIAGNOSIS — R1900 Intra-abdominal and pelvic swelling, mass and lump, unspecified site: Secondary | ICD-10-CM | POA: Diagnosis not present

## 2021-05-13 NOTE — Progress Notes (Signed)
Carelink Summary Report / Loop Recorder 

## 2021-05-14 DIAGNOSIS — D732 Chronic congestive splenomegaly: Secondary | ICD-10-CM | POA: Diagnosis not present

## 2021-05-14 DIAGNOSIS — Z6824 Body mass index (BMI) 24.0-24.9, adult: Secondary | ICD-10-CM | POA: Diagnosis not present

## 2021-05-14 DIAGNOSIS — K7460 Unspecified cirrhosis of liver: Secondary | ICD-10-CM | POA: Diagnosis not present

## 2021-05-14 DIAGNOSIS — R161 Splenomegaly, not elsewhere classified: Secondary | ICD-10-CM | POA: Diagnosis not present

## 2021-05-14 DIAGNOSIS — L03115 Cellulitis of right lower limb: Secondary | ICD-10-CM | POA: Diagnosis not present

## 2021-05-23 DIAGNOSIS — F329 Major depressive disorder, single episode, unspecified: Secondary | ICD-10-CM | POA: Diagnosis not present

## 2021-05-23 DIAGNOSIS — J449 Chronic obstructive pulmonary disease, unspecified: Secondary | ICD-10-CM | POA: Diagnosis not present

## 2021-05-23 DIAGNOSIS — E785 Hyperlipidemia, unspecified: Secondary | ICD-10-CM | POA: Diagnosis not present

## 2021-05-29 ENCOUNTER — Other Ambulatory Visit (INDEPENDENT_AMBULATORY_CARE_PROVIDER_SITE_OTHER): Payer: Medicare Other

## 2021-05-29 ENCOUNTER — Ambulatory Visit (INDEPENDENT_AMBULATORY_CARE_PROVIDER_SITE_OTHER): Payer: Medicare Other | Admitting: Gastroenterology

## 2021-05-29 ENCOUNTER — Encounter: Payer: Self-pay | Admitting: Gastroenterology

## 2021-05-29 ENCOUNTER — Other Ambulatory Visit: Payer: Self-pay

## 2021-05-29 VITALS — BP 102/64 | HR 84 | Ht 62.0 in | Wt 132.5 lb

## 2021-05-29 DIAGNOSIS — D509 Iron deficiency anemia, unspecified: Secondary | ICD-10-CM | POA: Diagnosis not present

## 2021-05-29 DIAGNOSIS — K7460 Unspecified cirrhosis of liver: Secondary | ICD-10-CM | POA: Diagnosis not present

## 2021-05-29 DIAGNOSIS — K7581 Nonalcoholic steatohepatitis (NASH): Secondary | ICD-10-CM

## 2021-05-29 DIAGNOSIS — R140 Abdominal distension (gaseous): Secondary | ICD-10-CM

## 2021-05-29 DIAGNOSIS — R109 Unspecified abdominal pain: Secondary | ICD-10-CM

## 2021-05-29 LAB — CBC WITH DIFFERENTIAL/PLATELET
Basophils Absolute: 0 10*3/uL (ref 0.0–0.1)
Basophils Relative: 0.7 % (ref 0.0–3.0)
Eosinophils Absolute: 0.2 10*3/uL (ref 0.0–0.7)
Eosinophils Relative: 4.6 % (ref 0.0–5.0)
HCT: 31.5 % — ABNORMAL LOW (ref 36.0–46.0)
Hemoglobin: 9.8 g/dL — ABNORMAL LOW (ref 12.0–15.0)
Lymphocytes Relative: 17.6 % (ref 12.0–46.0)
Lymphs Abs: 0.9 10*3/uL (ref 0.7–4.0)
MCHC: 31.3 g/dL (ref 30.0–36.0)
MCV: 80.1 fl (ref 78.0–100.0)
Monocytes Absolute: 0.8 10*3/uL (ref 0.1–1.0)
Monocytes Relative: 16.2 % — ABNORMAL HIGH (ref 3.0–12.0)
Neutro Abs: 3 10*3/uL (ref 1.4–7.7)
Neutrophils Relative %: 60.9 % (ref 43.0–77.0)
Platelets: 201 10*3/uL (ref 150.0–400.0)
RBC: 3.93 Mil/uL (ref 3.87–5.11)
RDW: 15.3 % (ref 11.5–15.5)
WBC: 5 10*3/uL (ref 4.0–10.5)

## 2021-05-29 LAB — COMPREHENSIVE METABOLIC PANEL
ALT: 26 U/L (ref 0–35)
AST: 36 U/L (ref 0–37)
Albumin: 3.2 g/dL — ABNORMAL LOW (ref 3.5–5.2)
Alkaline Phosphatase: 133 U/L — ABNORMAL HIGH (ref 39–117)
BUN: 15 mg/dL (ref 6–23)
CO2: 26 mEq/L (ref 19–32)
Calcium: 9 mg/dL (ref 8.4–10.5)
Chloride: 107 mEq/L (ref 96–112)
Creatinine, Ser: 0.82 mg/dL (ref 0.40–1.20)
GFR: 66.04 mL/min (ref >60.00)
Glucose, Bld: 74 mg/dL (ref 70–99)
Potassium: 4.3 mEq/L (ref 3.5–5.1)
Sodium: 138 mEq/L (ref 135–145)
Total Bilirubin: 0.6 mg/dL (ref 0.2–1.2)
Total Protein: 6.9 g/dL (ref 6.0–8.3)

## 2021-05-29 LAB — MAGNESIUM: Magnesium: 1.8 mg/dL (ref 1.5–2.5)

## 2021-05-29 LAB — PROTIME-INR
INR: 1.1 ratio — ABNORMAL HIGH (ref 0.8–1.0)
Prothrombin Time: 12.3 s (ref 9.6–13.1)

## 2021-05-29 LAB — AMMONIA: Ammonia: 47 umol/L — ABNORMAL HIGH (ref 11–35)

## 2021-05-29 NOTE — Progress Notes (Signed)
Chief Complaint:   Referring Provider:  Marco Collie, MD      ASSESSMENT AND PLAN;   #1. 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  May have some autoimmune component.  Mild splenomegaly. H/O EV s/p EVL 11/2020.  Decided to hold off on rpt EGD d/t age. no thrombocytopenia, coagulopathy, HE and jaundice.  #2. Abdo pain with bloating  #3. Peripheral edema but with ascites (resolved). No CHF on recent echo.  #4.  Chronic IDA, S/P multiple iron infusions. Multifactorial d/t hypersplenism, decreased iron intake, slow GI bleeding related to portal hypertensive gastropathy/early GAVE, mild chronic renal insufficiency. Neg colon 11/2020 except for incidental colonic AVMs s/p APC.  Plan: -Continue Pravacid 28m po qd -Continue Lasix 20 mg p.o. once a day -Low-salt diet. -CBC, CMP, PT INR, AFP, Mg, NH3 -CT AP with PO/IV contrast -Colace 1 tab po qd. if still with constipation in 2 weeks, start lactulose or MiraLAX. -She would like to hold off on repeat EGD at this time.    HPI:    Stephanie CAPSHAWis a 85y.o. female  With H/O CHF, COPD, chronic anemia, NASH cirrhosis, GERD, HLD, HTN, CVA, CKD2, S/P cholecystectomy 2013, vaginal hysterectomy  For follow-up visit.  Stopped Lasix  x 1 week, resulting in abdominal bloating and eventration of small umbilical hernia.  Had peripheral edema as well.  Lasix has been restarted.  She feels significantly better.  She is also getting potassium supplements.  Has been having generalized intermittent abdominal pain with abdominal bloating.  Has been compliant with low-salt diet.  Continues to have significant constipation with Bms 1 every other day with small pellet-like stools.  Denies any significant change in mental status.  Has been getting bruises especially in the legs with minimal trauma.  No nonsteroidals.  Denies having any significant weakness.  Heartburn is under good control with Prevacid.  Had recent EGD/colonoscopy as  below    From previous notes: With nonalcoholic liver cirrhosis - Dx on CT Oct 2016 with longstanding H/O fatty liver. -Neg acute hepatitis panel, celiac screen, positive ANA, positive AMA (132), positive ASMA (81).  May have some autoimmune component.  Refused liver biopsy. -S/P vaccines for hepatitis a and B. -EGD 11/2020: Gd II EV s/p EVL -EGD 10/2015 did not show any esophageal varices.  However, had portal hypertensive gastropathy ?Early GAVE. -Negative colonoscopy January 2016 except for colonic polyps s/p polypectomy, moderate predominantly sigmoid diverticulosis.  Highly redundant colon.  Internal hemorrhoids.  Bx- TAs.  Hold off on routine colon d/t age -CT AP 09/2016: Cirrhosis without ascites or splenomegaly. -[place]: Liver cirrhosis, borderline splenomegaly measuring 12 cm.  No focal lesions. -[place]: Cirrhosis, no focal hepatic lesion, ascites, mild splenomegaly.  She had 2D echo 10/2018 which showed ejection fraction of 60 to 65%.  Wt Readings from Last 3 Encounters:  05/29/21 132 lb 8 oz (60.1 kg)  11/30/20 134 lb (60.8 kg)  10/01/20 134 lb 8 oz (61 kg)   Recent GI work-up:  EGD 11/2020 - Grade II esophageal varices. Completely eradicated. Banded. - Portal hypertensive gastropathy. - Normal examined duodenum. Biopsied.  Colonoscopy 11/2020 - Three non-bleeding colonic angiodysplastic lesions. Treated with argon plasma coagulation (APC). - Diverticulosis in the sigmoid colon. - Non-bleeding internal hemorrhoids. - The examined portion of the ileum was normal. - The examination was otherwise normal on direct and retroflexion views. - No specimens collected. Past Medical History:  Diagnosis Date   Anemia    Arthritis  Autoimmune hepatitis ([place])    Cirrhosis ([place])    Congestive heart failure (HCC)    COPD (chronic obstructive pulmonary disease) (HCC)    Depression    Diverticulosis    GERD (gastroesophageal reflux disease)    Hepatitis A virus  infection 1963   History of colon polyps    History of colon polyps    Hyperlipidemia    Hypertension    Hypothyroid    IDA (iron deficiency anemia)    Does iron infusion   Microhematuria    NASH (nonalcoholic steatohepatitis)    Neuropathy    Osteoarthritis    Rectal prolapse    Stage 2 chronic kidney disease    Stroke ([place]) 11/13/2018   UTI (urinary tract infection)     Past Surgical History:  Procedure Laterality Date   ABDOMINAL HYSTERECTOMY  2012   AMPUTATION TOE     right 2nd toe    BIOPSY  12/04/2020   Procedure: BIOPSY;  Surgeon: Jackquline Denmark, MD;  Location: [facility];  Service: Endoscopy;;   BREAST SURGERY     Breast tumor removed non-malignant   BUNIONECTOMY  05/01/2016   CHOLECYSTECTOMY  2013   COLONOSCOPY  07/12/2014   Colonic polyps status post polypectomy. Moderate predominanlty sigmoid diverticulosis. High redundant colon.   COLONOSCOPY WITH PROPOFOL N/A 12/04/2020   Procedure: COLONOSCOPY WITH PROPOFOL;  Surgeon: Jackquline Denmark, MD;  Location: [facility];  Service: Endoscopy;  Laterality: N/A;   ESOPHAGEAL BANDING  12/04/2020   Procedure: ESOPHAGEAL BANDING;  Surgeon: Jackquline Denmark, MD;  Location: [facility];  Service: Endoscopy;;   ESOPHAGOGASTRODUODENOSCOPY  11/14/2015   Mild Candida esophagitis. Mild gastritis. Incidental gastic polyps (status post polypectomy x2)   ESOPHAGOGASTRODUODENOSCOPY (EGD) WITH PROPOFOL N/A 12/04/2020   Procedure: ESOPHAGOGASTRODUODENOSCOPY (EGD) WITH PROPOFOL;  Surgeon: Jackquline Denmark, MD;  Location: [facility];  Service: Endoscopy;  Laterality: N/A;   HOT HEMOSTASIS N/A 12/04/2020   Procedure: HOT HEMOSTASIS (ARGON PLASMA COAGULATION/BICAP);  Surgeon: Jackquline Denmark, MD;  Location: [facility];  Service: Endoscopy;  Laterality: N/A;   LOOP RECORDER INSERTION N/A 11/16/2018   Procedure: LOOP RECORDER INSERTION;  Surgeon: Thompson Grayer, MD;  Location: [facility];  Service: Cardiovascular;  Laterality: N/A;   PARTIAL HIP  ARTHROPLASTY Left 12/2018   [facility]   vaginal lesion removed     Precancerous    Family History  Problem Relation Age of Onset   Cancer Mother    Diabetes Father    Heart disease Father     Social History   Tobacco Use   Smoking status: Former    Types: Cigarettes    Quit date: 11/16/1981    Years since quitting: 39.5   Smokeless tobacco: Never  Vaping Use   Vaping Use: Never used  Substance Use Topics   Alcohol use: Not Currently   Drug use: Never    Current Outpatient Medications  Medication Sig Dispense Refill   alendronate (FOSAMAX) 70 MG tablet Take 70 mg by mouth once a week.     arformoterol (BROVANA) 15 MCG/2ML NEBU Take 15 mcg by nebulization 2 (two) times daily.     budesonide (PULMICORT) 0.5 MG/2ML nebulizer solution Take 0.5 mg by nebulization 2 (two) times daily.     escitalopram (LEXAPRO) 10 MG tablet Take 10 mg by mouth daily.     furosemide (LASIX) 20 MG tablet Take 1 tablet (20 mg total) by mouth daily. 90 tablet 2   lansoprazole (PREVACID) 15 MG capsule Take 30 mg by mouth daily.  levothyroxine (SYNTHROID, LEVOTHROID) 112 MCG tablet Take 112 mcg by mouth daily before breakfast.     metoprolol succinate (TOPROL-XL) 25 MG 24 hr tablet Take 1 tablet (25 mg total) by mouth daily. 30 tablet 1   Potassium Chloride ER 20 MEQ TBCR Take 1 tablet by mouth daily.     revefenacin (YUPELRI) 175 MCG/3ML nebulizer solution Take 175 mcg by nebulization daily.     No current facility-administered medications for this visit.    Allergies  Allergen Reactions   Penicillins Anaphylaxis    Did it involve swelling of the face/tongue/throat, SOB, or low BP? yes Did it involve sudden or severe rash/hives, skin peeling, or any reaction on the inside of your mouth or nose? no Did you need to seek medical attention at a hospital or doctor's office? yes When did it last happen?    pt was 22 If all above answers are "NO", may proceed with cephalosporin use.     Codeine     hyper    Review of Systems:  neg     Physical Exam:    BP 102/64 (BP Location: Left Arm, Patient Position: Sitting, Cuff Size: Normal)   Pulse 84   Ht _0  (1.575 m)   Wt 132 lb 8 oz (60.1 kg)   SpO2 93%   BMI 24.23 kg/m  Wt Readings from Last 3 Encounters:  05/29/21 132 lb 8 oz (60.1 kg)  11/30/20 134 lb (60.8 kg)  10/01/20 134 lb 8 oz (61 kg)   Constitutional:  Well-developed, in no acute distress. Psychiatric: Normal mood and affect. Behavior is normal. HEENT: Pupils normal.  Conjunctivae are normal. No scleral icterus. Neck supple.  Cardiovascular: Normal rate, regular rhythm. No edema Pulmonary/chest: [place] normal and breath sounds normal. No wheezing, rales or rhonchi. Abdominal: Soft, nondistended. Nontender. Bowel sounds active throughout. There are no masses palpable. No hepatomegaly. Rectal: Deferred Neurological: Alert and oriented to person place and time. Skin: Skin is warm and dry. No rashes noted.  Data Reviewed: I have personally reviewed following labs and imaging studies  CBC: CBC Latest Ref Rng & Units 12/04/2020 06/14/2020 11/29/2018  WBC 4.0 - 10.5 K/uL 4.5 5.6 7.3  Hemoglobin 12.0 - 15.0 g/dL 10.9(L) 10.1(L) 8.4(L)  Hematocrit 36.0 - 46.0 % 36.5 32.8(L) 30.2(L)  Platelets 150 - 400 K/uL 237 332 442(H)    CMP: CMP Latest Ref Rng & Units 12/04/2020 06/14/2020 11/24/2018  Glucose 70 - 99 mg/dL 90 106(H) 87  BUN 8 - 23 mg/dL _1 Creatinine 0.44 - 1.00 mg/dL 0.83 0.89 0.90  Sodium 135 - 145 mmol/L 135 139 137  Potassium 3.5 - 5.1 mmol/L 3.7 4.0 4.1  Chloride 98 - 111 mmol/L 103 105 107  CO2 22 - 32 mmol/L _2 Calcium 8.9 - 10.3 mg/dL 9.2 9.2 9.6  Total Protein 6.5 - 8.1 g/dL 7.6 7.0 -  Total Bilirubin 0.3 - 1.2 mg/dL 1.2 0.4 -  Alkaline Phos 38 - 126 U/L 154(H) 133(H) -  AST 15 - 41 U/L 52(H) 40 -  ALT 0 - 44 U/L 44 34(H) -   Hepatic Function Latest Ref Rng & Units 12/04/2020 06/14/2020 11/17/2018  Total Protein 6.5 - 8.1  g/dL 7.6 7.0 5.8(L)  Albumin 3.5 - 5.0 g/dL 3.6 3.7 2.7(L)  AST 15 - 41 U/L 52(H) 40 24  ALT 0 - 44 U/L 44 34(H) 19  Alk Phosphatase 38 - 126 U/L 154(H) 133(H) 75  Total Bilirubin 0.3 -  1.2 mg/dL 1.2 0.4 0.5      Radiology Studies: CUP PACEART REMOTE DEVICE CHECK  Result Date: 05/02/2021 ILR summary report received. Battery status OK. Normal device function. No new symptom, tachy, brady, or pause episodes. No new AF episodes. Monthly summary reports and ROV/PRN Kathy Breach, RN, CCDS, [employer]     Carmell Austria, MD 05/29/2021, 9:53 AM  Cc: Marco Collie, MD

## 2021-05-29 NOTE — Patient Instructions (Signed)
If you are age 83 or older, your body mass index should be between 23-30. Your Body mass index is 24.23 kg/m. If this is out of the aforementioned range listed, please consider follow up with your Primary Care Provider.  If you are age 58 or younger, your body mass index should be between 19-25. Your Body mass index is 24.23 kg/m. If this is out of the aformentioned range listed, please consider follow up with your Primary Care Provider.   ________________________________________________________  The [facility] providers would like to encourage you to use [facility] to communicate with providers for non-urgent requests or questions.  Due to long hold times on the telephone, sending your provider a message by [facility] may be a faster and more efficient way to get a response.  Please allow 48 business hours for a response.  Please remember that this is for non-urgent requests.  _______________________________________________________  Please go to the lab on the 2nd floor [address] before you leave the office today.   Continue current medications.  Colace 1 tablet daily.  You have been scheduled [facility] [place] [address] [facility]   You are scheduled on   06-04-2021   at 930am. You should arrive 15 minutes prior to your appointment time for registration. Please follow the written instructions below on the day of your exam:  WARNING: IF YOU ARE ALLERGIC TO IODINE/X-RAY DYE, PLEASE NOTIFY RADIOLOGY IMMEDIATELY AT [phone]! YOU WILL BE GIVEN A 13 HOUR PREMEDICATION PREP.  1) Do not eat or drink anything after 530am (4 hours prior to your test) 2) You have been given 2 bottles of oral contrast to drink. The solution may taste better if refrigerated, but do NOT add ice or any other liquid to this solution. Shake well before drinking.    Drink 1 bottle of contrast @ 730am (2 hours prior to your  exam)  Drink 1 bottle of contrast @ 830am (1 hour prior to your exam)  You may take any medications as prescribed with a small amount of water, if necessary. If you take any of the following medications: METFORMIN, GLUCOPHAGE, GLUCOVANCE, AVANDAMET, RIOMET, FORTAMET, [place] MET, JANUMET, GLUMETZA or METAGLIP, you MAY be asked to HOLD this medication 48 hours AFTER the exam.  The purpose of you drinking the oral contrast is to aid in the visualization of your intestinal tract. The contrast solution may cause some diarrhea. Depending on your individual set of symptoms, you may also receive an intravenous injection of x-ray contrast/dye. Plan on being at [facility] for 30 minutes or longer, depending on the type of exam you are having performed.  This test typically takes 30-45 minutes to complete.  If you have any questions regarding your exam or if you need to reschedule, you may call the CT department at [phone] between the hours of 8:00 am and 5:00 pm, Monday-Friday.  ________________________________________________________________________  Low-Sodium Eating Plan Sodium, which is an element that makes up salt, helps you maintain a healthy balance of fluids in your body. Too much sodium can increase your blood pressure and cause fluid and waste to be held in your body. Your health care provider or dietitian may recommend following this plan if you have high blood pressure (hypertension), kidney disease, liver disease, or heart failure. Eating less sodium can help lower your blood pressure, reduce swelling, and protect your heart, liver, and kidneys. What are tips  for following this plan? Reading food labels The Nutrition Facts label lists the amount of sodium in one serving of the food. If you eat more than one serving, you must multiply the listed amount of sodium by the number of servings. Choose foods with less than 140 mg of sodium per serving. Avoid foods with 300 mg of sodium  or more per serving. Shopping  Look for lower-sodium products, often labeled as "low-sodium" or "no salt added." Always check the sodium content, even if foods are labeled as "unsalted" or "no salt added." Buy fresh foods. Avoid canned foods and pre-made or frozen meals. Avoid canned, cured, or processed meats. Buy breads that have less than 80 mg of sodium per slice. Cooking  Eat more home-cooked food and less restaurant, buffet, and fast food. Avoid adding salt when cooking. Use salt-free seasonings or herbs instead of table salt or sea salt. Check with your health care provider or pharmacist before using salt substitutes. Cook with plant-based oils, such as canola, sunflower, or olive oil. Meal planning When eating at a restaurant, ask that your food be prepared with less salt or no salt, if possible. Avoid dishes labeled as brined, pickled, cured, smoked, or made with soy sauce, miso, or teriyaki sauce. Avoid foods that contain MSG (monosodium glutamate). MSG is sometimes added to [place] food, bouillon, and some canned foods. Make meals that can be grilled, baked, poached, roasted, or steamed. These are generally made with less sodium. General information Most people on this plan should limit their sodium intake to 1,500-2,000 mg (milligrams) of sodium each day. What foods should I eat? Fruits Fresh, frozen, or canned fruit. Fruit juice. Vegetables Fresh or frozen vegetables. "No salt added" canned vegetables. "No salt added" tomato sauce and paste. Low-sodium or reduced-sodium tomato and vegetable juice. Grains Low-sodium cereals, including oats, puffed wheat and rice, and shredded wheat. Low-sodium crackers. Unsalted rice. Unsalted pasta. Low-sodium bread. Whole-grain breads and whole-grain pasta. Meats and other proteins Fresh or frozen (no salt added) meat, poultry, seafood, and fish. Low-sodium canned tuna and salmon. Unsalted nuts. Dried peas, beans, and lentils without added  salt. Unsalted canned beans. Eggs. Unsalted nut butters. Dairy Milk. Soy milk. Cheese that is naturally low in sodium, such as ricotta cheese, fresh mozzarella, or Swiss cheese. Low-sodium or reduced-sodium cheese. Cream cheese. Yogurt. Seasonings and condiments Fresh and dried herbs and spices. Salt-free seasonings. Low-sodium mustard and ketchup. Sodium-free salad dressing. Sodium-free light mayonnaise. Fresh or refrigerated horseradish. Lemon juice. Vinegar. Other foods Homemade, reduced-sodium, or low-sodium soups. Unsalted popcorn and pretzels. Low-salt or salt-free chips. The items listed above may not be a complete list of foods and beverages you can eat. Contact a dietitian for more information. What foods should I avoid? Vegetables Sauerkraut, pickled vegetables, and relishes. Olives. [place] fries. Onion rings. Regular canned vegetables (not low-sodium or reduced-sodium). Regular canned tomato sauce and paste (not low-sodium or reduced-sodium). Regular tomato and vegetable juice (not low-sodium or reduced-sodium). Frozen vegetables in sauces. Grains Instant hot cereals. Bread stuffing, pancake, and biscuit mixes. Croutons. Seasoned rice or pasta mixes. Noodle soup cups. Boxed or frozen macaroni and cheese. Regular salted crackers. Self-rising flour. Meats and other proteins Meat or fish that is salted, canned, smoked, spiced, or pickled. Precooked or cured meat, such as sausages or meat loaves. Berniece Salines. Ham. Pepperoni. Hot dogs. Corned beef. Chipped beef. Salt pork. Jerky. Pickled herring. Anchovies and sardines. Regular canned tuna. Salted nuts. Dairy Processed cheese and cheese spreads. Hard cheeses. Cheese curds. Blue cheese.  Feta cheese. String cheese. Regular cottage cheese. Buttermilk. Canned milk. Fats and oils Salted butter. Regular margarine. Ghee. Bacon fat. Seasonings and condiments Onion salt, garlic salt, seasoned salt, table salt, and sea salt. Canned and packaged gravies.  Worcestershire sauce. Tartar sauce. Barbecue sauce. Teriyaki sauce. Soy sauce, including reduced-sodium. Steak sauce. Fish sauce. Oyster sauce. Cocktail sauce. Horseradish that you find on the shelf. Regular ketchup and mustard. Meat flavorings and tenderizers. Bouillon cubes. Hot sauce. Pre-made or packaged marinades. Pre-made or packaged taco seasonings. Relishes. Regular salad dressings. Salsa. Other foods Salted popcorn and pretzels. Corn chips and puffs. Potato and tortilla chips. Canned or dried soups. Pizza. Frozen entrees and pot pies. The items listed above may not be a complete list of foods and beverages you should avoid. Contact a dietitian for more information. Summary Eating less sodium can help lower your blood pressure, reduce swelling, and protect your heart, liver, and kidneys. Most people on this plan should limit their sodium intake to 1,500-2,000 mg (milligrams) of sodium each day. Canned, boxed, and frozen foods are high in sodium. Restaurant foods, fast foods, and pizza are also very high in sodium. You also get sodium by adding salt to food. Try to cook at home, eat more fresh fruits and vegetables, and eat less fast food and canned, processed, or prepared foods. This information is not intended to replace advice given to you by your health care provider. Make sure you discuss any questions you have with your health care provider. Document Revised: 07/15/2019 Document Reviewed: 05/11/2019 Elsevier Patient Education  2022 [employer].

## 2021-05-30 LAB — AFP TUMOR MARKER: AFP-Tumor Marker: 2.5 ng/mL

## 2021-06-01 DIAGNOSIS — M1990 Unspecified osteoarthritis, unspecified site: Secondary | ICD-10-CM | POA: Diagnosis present

## 2021-06-01 DIAGNOSIS — E162 Hypoglycemia, unspecified: Secondary | ICD-10-CM | POA: Diagnosis not present

## 2021-06-01 DIAGNOSIS — E161 Other hypoglycemia: Secondary | ICD-10-CM | POA: Diagnosis not present

## 2021-06-01 DIAGNOSIS — I6389 Other cerebral infarction: Secondary | ICD-10-CM | POA: Diagnosis not present

## 2021-06-01 DIAGNOSIS — D509 Iron deficiency anemia, unspecified: Secondary | ICD-10-CM | POA: Diagnosis present

## 2021-06-01 DIAGNOSIS — J449 Chronic obstructive pulmonary disease, unspecified: Secondary | ICD-10-CM | POA: Diagnosis present

## 2021-06-01 DIAGNOSIS — Z87891 Personal history of nicotine dependence: Secondary | ICD-10-CM | POA: Diagnosis not present

## 2021-06-01 DIAGNOSIS — Z8673 Personal history of transient ischemic attack (TIA), and cerebral infarction without residual deficits: Secondary | ICD-10-CM | POA: Diagnosis not present

## 2021-06-01 DIAGNOSIS — I639 Cerebral infarction, unspecified: Secondary | ICD-10-CM | POA: Diagnosis not present

## 2021-06-01 DIAGNOSIS — F32A Depression, unspecified: Secondary | ICD-10-CM | POA: Diagnosis present

## 2021-06-01 DIAGNOSIS — Z880 Allergy status to penicillin: Secondary | ICD-10-CM | POA: Diagnosis not present

## 2021-06-01 DIAGNOSIS — Z79899 Other long term (current) drug therapy: Secondary | ICD-10-CM | POA: Diagnosis not present

## 2021-06-01 DIAGNOSIS — I361 Nonrheumatic tricuspid (valve) insufficiency: Secondary | ICD-10-CM | POA: Diagnosis not present

## 2021-06-01 DIAGNOSIS — N390 Urinary tract infection, site not specified: Secondary | ICD-10-CM | POA: Diagnosis present

## 2021-06-01 DIAGNOSIS — K219 Gastro-esophageal reflux disease without esophagitis: Secondary | ICD-10-CM | POA: Diagnosis present

## 2021-06-01 DIAGNOSIS — E039 Hypothyroidism, unspecified: Secondary | ICD-10-CM | POA: Diagnosis present

## 2021-06-01 DIAGNOSIS — Z7982 Long term (current) use of aspirin: Secondary | ICD-10-CM | POA: Diagnosis not present

## 2021-06-01 DIAGNOSIS — R29818 Other symptoms and signs involving the nervous system: Secondary | ICD-10-CM | POA: Diagnosis not present

## 2021-06-01 DIAGNOSIS — R531 Weakness: Secondary | ICD-10-CM | POA: Diagnosis not present

## 2021-06-01 DIAGNOSIS — I6523 Occlusion and stenosis of bilateral carotid arteries: Secondary | ICD-10-CM | POA: Diagnosis not present

## 2021-06-01 DIAGNOSIS — I10 Essential (primary) hypertension: Secondary | ICD-10-CM | POA: Diagnosis present

## 2021-06-01 DIAGNOSIS — K754 Autoimmune hepatitis: Secondary | ICD-10-CM | POA: Diagnosis present

## 2021-06-01 DIAGNOSIS — I340 Nonrheumatic mitral (valve) insufficiency: Secondary | ICD-10-CM | POA: Diagnosis not present

## 2021-06-01 DIAGNOSIS — K7460 Unspecified cirrhosis of liver: Secondary | ICD-10-CM | POA: Diagnosis present

## 2021-06-01 DIAGNOSIS — R29706 NIHSS score 6: Secondary | ICD-10-CM | POA: Diagnosis not present

## 2021-06-01 DIAGNOSIS — R29898 Other symptoms and signs involving the musculoskeletal system: Secondary | ICD-10-CM | POA: Diagnosis not present

## 2021-06-02 DIAGNOSIS — I6389 Other cerebral infarction: Secondary | ICD-10-CM

## 2021-06-02 DIAGNOSIS — N390 Urinary tract infection, site not specified: Secondary | ICD-10-CM | POA: Diagnosis not present

## 2021-06-02 DIAGNOSIS — R29706 NIHSS score 6: Secondary | ICD-10-CM | POA: Diagnosis not present

## 2021-06-02 DIAGNOSIS — I639 Cerebral infarction, unspecified: Secondary | ICD-10-CM | POA: Diagnosis not present

## 2021-06-03 ENCOUNTER — Other Ambulatory Visit: Payer: Self-pay

## 2021-06-03 DIAGNOSIS — K7460 Unspecified cirrhosis of liver: Secondary | ICD-10-CM

## 2021-06-03 DIAGNOSIS — N390 Urinary tract infection, site not specified: Secondary | ICD-10-CM | POA: Diagnosis not present

## 2021-06-03 DIAGNOSIS — I639 Cerebral infarction, unspecified: Secondary | ICD-10-CM | POA: Diagnosis not present

## 2021-06-03 DIAGNOSIS — R29706 NIHSS score 6: Secondary | ICD-10-CM | POA: Diagnosis not present

## 2021-06-03 MED ORDER — RIFAXIMIN 550 MG PO TABS: 550.0000 mg | ORAL_TABLET | Freq: Two times a day (BID) | ORAL | 11 refills | Status: DC

## 2021-06-04 ENCOUNTER — Ambulatory Visit (HOSPITAL_BASED_OUTPATIENT_CLINIC_OR_DEPARTMENT_OTHER): Payer: Medicare Other

## 2021-06-05 LAB — CUP PACEART REMOTE DEVICE CHECK
Date Time Interrogation Session: 20221212012652
Implantable Pulse Generator Implant Date: 20200526

## 2021-06-10 ENCOUNTER — Ambulatory Visit (INDEPENDENT_AMBULATORY_CARE_PROVIDER_SITE_OTHER): Payer: Medicare Other

## 2021-06-10 DIAGNOSIS — I69354 Hemiplegia and hemiparesis following cerebral infarction affecting left non-dominant side: Secondary | ICD-10-CM | POA: Diagnosis not present

## 2021-06-10 DIAGNOSIS — D509 Iron deficiency anemia, unspecified: Secondary | ICD-10-CM | POA: Diagnosis not present

## 2021-06-10 DIAGNOSIS — Z7689 Persons encountering health services in other specified circumstances: Secondary | ICD-10-CM | POA: Diagnosis not present

## 2021-06-10 DIAGNOSIS — I639 Cerebral infarction, unspecified: Secondary | ICD-10-CM

## 2021-06-11 DIAGNOSIS — K5790 Diverticulosis of intestine, part unspecified, without perforation or abscess without bleeding: Secondary | ICD-10-CM | POA: Diagnosis not present

## 2021-06-11 DIAGNOSIS — K754 Autoimmune hepatitis: Secondary | ICD-10-CM | POA: Diagnosis not present

## 2021-06-11 DIAGNOSIS — D638 Anemia in other chronic diseases classified elsewhere: Secondary | ICD-10-CM | POA: Diagnosis not present

## 2021-06-11 DIAGNOSIS — M150 Primary generalized (osteo)arthritis: Secondary | ICD-10-CM | POA: Diagnosis not present

## 2021-06-11 DIAGNOSIS — Z7982 Long term (current) use of aspirin: Secondary | ICD-10-CM | POA: Diagnosis not present

## 2021-06-11 DIAGNOSIS — Z8679 Personal history of other diseases of the circulatory system: Secondary | ICD-10-CM | POA: Diagnosis not present

## 2021-06-11 DIAGNOSIS — I69354 Hemiplegia and hemiparesis following cerebral infarction affecting left non-dominant side: Secondary | ICD-10-CM | POA: Diagnosis not present

## 2021-06-11 DIAGNOSIS — D509 Iron deficiency anemia, unspecified: Secondary | ICD-10-CM | POA: Diagnosis not present

## 2021-06-11 DIAGNOSIS — Z87440 Personal history of urinary (tract) infections: Secondary | ICD-10-CM | POA: Diagnosis not present

## 2021-06-11 DIAGNOSIS — E039 Hypothyroidism, unspecified: Secondary | ICD-10-CM | POA: Diagnosis not present

## 2021-06-11 DIAGNOSIS — F32A Depression, unspecified: Secondary | ICD-10-CM | POA: Diagnosis not present

## 2021-06-11 DIAGNOSIS — K7460 Unspecified cirrhosis of liver: Secondary | ICD-10-CM | POA: Diagnosis not present

## 2021-06-11 DIAGNOSIS — K219 Gastro-esophageal reflux disease without esophagitis: Secondary | ICD-10-CM | POA: Diagnosis not present

## 2021-06-11 DIAGNOSIS — J449 Chronic obstructive pulmonary disease, unspecified: Secondary | ICD-10-CM | POA: Diagnosis not present

## 2021-06-11 DIAGNOSIS — I10 Essential (primary) hypertension: Secondary | ICD-10-CM | POA: Diagnosis not present

## 2021-06-11 DIAGNOSIS — Z87891 Personal history of nicotine dependence: Secondary | ICD-10-CM | POA: Diagnosis not present

## 2021-06-13 DIAGNOSIS — E039 Hypothyroidism, unspecified: Secondary | ICD-10-CM | POA: Diagnosis not present

## 2021-06-13 DIAGNOSIS — J449 Chronic obstructive pulmonary disease, unspecified: Secondary | ICD-10-CM | POA: Diagnosis not present

## 2021-06-13 DIAGNOSIS — D509 Iron deficiency anemia, unspecified: Secondary | ICD-10-CM | POA: Diagnosis not present

## 2021-06-13 DIAGNOSIS — I69354 Hemiplegia and hemiparesis following cerebral infarction affecting left non-dominant side: Secondary | ICD-10-CM | POA: Diagnosis not present

## 2021-06-13 DIAGNOSIS — I10 Essential (primary) hypertension: Secondary | ICD-10-CM | POA: Diagnosis not present

## 2021-06-13 DIAGNOSIS — K219 Gastro-esophageal reflux disease without esophagitis: Secondary | ICD-10-CM | POA: Diagnosis not present

## 2021-06-18 DIAGNOSIS — I69354 Hemiplegia and hemiparesis following cerebral infarction affecting left non-dominant side: Secondary | ICD-10-CM | POA: Diagnosis not present

## 2021-06-18 DIAGNOSIS — J449 Chronic obstructive pulmonary disease, unspecified: Secondary | ICD-10-CM | POA: Diagnosis not present

## 2021-06-18 DIAGNOSIS — K219 Gastro-esophageal reflux disease without esophagitis: Secondary | ICD-10-CM | POA: Diagnosis not present

## 2021-06-18 DIAGNOSIS — E039 Hypothyroidism, unspecified: Secondary | ICD-10-CM | POA: Diagnosis not present

## 2021-06-18 DIAGNOSIS — I10 Essential (primary) hypertension: Secondary | ICD-10-CM | POA: Diagnosis not present

## 2021-06-18 DIAGNOSIS — D509 Iron deficiency anemia, unspecified: Secondary | ICD-10-CM | POA: Diagnosis not present

## 2021-06-20 DIAGNOSIS — J449 Chronic obstructive pulmonary disease, unspecified: Secondary | ICD-10-CM | POA: Diagnosis not present

## 2021-06-20 DIAGNOSIS — D509 Iron deficiency anemia, unspecified: Secondary | ICD-10-CM | POA: Diagnosis not present

## 2021-06-20 DIAGNOSIS — E039 Hypothyroidism, unspecified: Secondary | ICD-10-CM | POA: Diagnosis not present

## 2021-06-20 DIAGNOSIS — I10 Essential (primary) hypertension: Secondary | ICD-10-CM | POA: Diagnosis not present

## 2021-06-20 DIAGNOSIS — I69354 Hemiplegia and hemiparesis following cerebral infarction affecting left non-dominant side: Secondary | ICD-10-CM | POA: Diagnosis not present

## 2021-06-20 DIAGNOSIS — K219 Gastro-esophageal reflux disease without esophagitis: Secondary | ICD-10-CM | POA: Diagnosis not present

## 2021-06-20 NOTE — Progress Notes (Signed)
Carelink Summary Report / Loop Recorder 

## 2021-06-21 DIAGNOSIS — J449 Chronic obstructive pulmonary disease, unspecified: Secondary | ICD-10-CM | POA: Diagnosis not present

## 2021-06-21 DIAGNOSIS — I10 Essential (primary) hypertension: Secondary | ICD-10-CM | POA: Diagnosis not present

## 2021-06-21 DIAGNOSIS — E039 Hypothyroidism, unspecified: Secondary | ICD-10-CM | POA: Diagnosis not present

## 2021-06-21 DIAGNOSIS — K219 Gastro-esophageal reflux disease without esophagitis: Secondary | ICD-10-CM | POA: Diagnosis not present

## 2021-06-21 DIAGNOSIS — D509 Iron deficiency anemia, unspecified: Secondary | ICD-10-CM | POA: Diagnosis not present

## 2021-06-21 DIAGNOSIS — I69354 Hemiplegia and hemiparesis following cerebral infarction affecting left non-dominant side: Secondary | ICD-10-CM | POA: Diagnosis not present

## 2021-06-23 DIAGNOSIS — F339 Major depressive disorder, recurrent, unspecified: Secondary | ICD-10-CM | POA: Diagnosis not present

## 2021-06-23 DIAGNOSIS — J449 Chronic obstructive pulmonary disease, unspecified: Secondary | ICD-10-CM | POA: Diagnosis not present

## 2021-06-23 DIAGNOSIS — D509 Iron deficiency anemia, unspecified: Secondary | ICD-10-CM | POA: Diagnosis not present

## 2021-06-23 DIAGNOSIS — E785 Hyperlipidemia, unspecified: Secondary | ICD-10-CM | POA: Diagnosis not present

## 2021-06-27 DIAGNOSIS — I252 Old myocardial infarction: Secondary | ICD-10-CM | POA: Diagnosis not present

## 2021-06-27 DIAGNOSIS — K5730 Diverticulosis of large intestine without perforation or abscess without bleeding: Secondary | ICD-10-CM | POA: Diagnosis not present

## 2021-06-27 DIAGNOSIS — F32A Depression, unspecified: Secondary | ICD-10-CM | POA: Diagnosis not present

## 2021-06-27 DIAGNOSIS — Z87440 Personal history of urinary (tract) infections: Secondary | ICD-10-CM | POA: Diagnosis not present

## 2021-06-27 DIAGNOSIS — Z87891 Personal history of nicotine dependence: Secondary | ICD-10-CM | POA: Diagnosis not present

## 2021-06-27 DIAGNOSIS — K449 Diaphragmatic hernia without obstruction or gangrene: Secondary | ICD-10-CM | POA: Diagnosis not present

## 2021-06-27 DIAGNOSIS — Z8719 Personal history of other diseases of the digestive system: Secondary | ICD-10-CM | POA: Diagnosis not present

## 2021-06-27 DIAGNOSIS — M1990 Unspecified osteoarthritis, unspecified site: Secondary | ICD-10-CM | POA: Diagnosis not present

## 2021-06-27 DIAGNOSIS — D649 Anemia, unspecified: Secondary | ICD-10-CM | POA: Diagnosis not present

## 2021-06-27 DIAGNOSIS — I10 Essential (primary) hypertension: Secondary | ICD-10-CM | POA: Diagnosis not present

## 2021-06-27 DIAGNOSIS — I5083 High output heart failure: Secondary | ICD-10-CM | POA: Diagnosis not present

## 2021-06-27 DIAGNOSIS — Z880 Allergy status to penicillin: Secondary | ICD-10-CM | POA: Diagnosis not present

## 2021-06-27 DIAGNOSIS — K7469 Other cirrhosis of liver: Secondary | ICD-10-CM | POA: Diagnosis not present

## 2021-06-27 DIAGNOSIS — Z79899 Other long term (current) drug therapy: Secondary | ICD-10-CM | POA: Diagnosis not present

## 2021-06-27 DIAGNOSIS — K219 Gastro-esophageal reflux disease without esophagitis: Secondary | ICD-10-CM | POA: Diagnosis not present

## 2021-06-27 DIAGNOSIS — J449 Chronic obstructive pulmonary disease, unspecified: Secondary | ICD-10-CM | POA: Diagnosis not present

## 2021-06-27 DIAGNOSIS — K7460 Unspecified cirrhosis of liver: Secondary | ICD-10-CM | POA: Diagnosis not present

## 2021-06-27 DIAGNOSIS — E039 Hypothyroidism, unspecified: Secondary | ICD-10-CM | POA: Diagnosis not present

## 2021-06-27 DIAGNOSIS — Z8673 Personal history of transient ischemic attack (TIA), and cerebral infarction without residual deficits: Secondary | ICD-10-CM | POA: Diagnosis not present

## 2021-06-27 DIAGNOSIS — D509 Iron deficiency anemia, unspecified: Secondary | ICD-10-CM | POA: Diagnosis not present

## 2021-06-27 DIAGNOSIS — J439 Emphysema, unspecified: Secondary | ICD-10-CM | POA: Diagnosis not present

## 2021-06-27 DIAGNOSIS — K922 Gastrointestinal hemorrhage, unspecified: Secondary | ICD-10-CM | POA: Diagnosis not present

## 2021-06-27 DIAGNOSIS — K31811 Angiodysplasia of stomach and duodenum with bleeding: Secondary | ICD-10-CM | POA: Diagnosis not present

## 2021-06-27 DIAGNOSIS — Z7982 Long term (current) use of aspirin: Secondary | ICD-10-CM | POA: Diagnosis not present

## 2021-06-28 DIAGNOSIS — D509 Iron deficiency anemia, unspecified: Secondary | ICD-10-CM | POA: Diagnosis not present

## 2021-06-28 DIAGNOSIS — I10 Essential (primary) hypertension: Secondary | ICD-10-CM | POA: Diagnosis not present

## 2021-06-28 DIAGNOSIS — K31811 Angiodysplasia of stomach and duodenum with bleeding: Secondary | ICD-10-CM | POA: Diagnosis not present

## 2021-06-28 DIAGNOSIS — K449 Diaphragmatic hernia without obstruction or gangrene: Secondary | ICD-10-CM | POA: Diagnosis not present

## 2021-06-28 DIAGNOSIS — K219 Gastro-esophageal reflux disease without esophagitis: Secondary | ICD-10-CM | POA: Diagnosis not present

## 2021-06-28 DIAGNOSIS — Z8719 Personal history of other diseases of the digestive system: Secondary | ICD-10-CM | POA: Diagnosis not present

## 2021-06-28 DIAGNOSIS — K7460 Unspecified cirrhosis of liver: Secondary | ICD-10-CM | POA: Diagnosis not present

## 2021-06-29 DIAGNOSIS — K449 Diaphragmatic hernia without obstruction or gangrene: Secondary | ICD-10-CM | POA: Diagnosis not present

## 2021-06-29 DIAGNOSIS — K31811 Angiodysplasia of stomach and duodenum with bleeding: Secondary | ICD-10-CM | POA: Diagnosis not present

## 2021-06-29 DIAGNOSIS — Z8719 Personal history of other diseases of the digestive system: Secondary | ICD-10-CM | POA: Diagnosis not present

## 2021-06-29 DIAGNOSIS — D509 Iron deficiency anemia, unspecified: Secondary | ICD-10-CM | POA: Diagnosis not present

## 2021-06-29 DIAGNOSIS — K219 Gastro-esophageal reflux disease without esophagitis: Secondary | ICD-10-CM | POA: Diagnosis not present

## 2021-07-01 DIAGNOSIS — R112 Nausea with vomiting, unspecified: Secondary | ICD-10-CM | POA: Diagnosis not present

## 2021-07-01 DIAGNOSIS — R109 Unspecified abdominal pain: Secondary | ICD-10-CM | POA: Diagnosis not present

## 2021-07-01 DIAGNOSIS — Z5321 Procedure and treatment not carried out due to patient leaving prior to being seen by health care provider: Secondary | ICD-10-CM | POA: Diagnosis not present

## 2021-07-01 DIAGNOSIS — R0789 Other chest pain: Secondary | ICD-10-CM | POA: Diagnosis not present

## 2021-07-01 DIAGNOSIS — R079 Chest pain, unspecified: Secondary | ICD-10-CM | POA: Diagnosis not present

## 2021-07-12 DIAGNOSIS — D509 Iron deficiency anemia, unspecified: Secondary | ICD-10-CM | POA: Diagnosis not present

## 2021-07-12 DIAGNOSIS — Z6823 Body mass index (BMI) 23.0-23.9, adult: Secondary | ICD-10-CM | POA: Diagnosis not present

## 2021-07-17 DIAGNOSIS — D509 Iron deficiency anemia, unspecified: Secondary | ICD-10-CM | POA: Diagnosis not present

## 2021-07-23 ENCOUNTER — Ambulatory Visit (INDEPENDENT_AMBULATORY_CARE_PROVIDER_SITE_OTHER): Payer: Medicare Other

## 2021-07-23 DIAGNOSIS — I639 Cerebral infarction, unspecified: Secondary | ICD-10-CM

## 2021-07-23 LAB — CUP PACEART REMOTE DEVICE CHECK
Date Time Interrogation Session: 20230130231503
Implantable Pulse Generator Implant Date: 20200526

## 2021-07-24 DIAGNOSIS — Z7189 Other specified counseling: Secondary | ICD-10-CM | POA: Diagnosis not present

## 2021-07-24 DIAGNOSIS — F339 Major depressive disorder, recurrent, unspecified: Secondary | ICD-10-CM | POA: Diagnosis not present

## 2021-07-24 DIAGNOSIS — D509 Iron deficiency anemia, unspecified: Secondary | ICD-10-CM | POA: Diagnosis not present

## 2021-07-24 DIAGNOSIS — J449 Chronic obstructive pulmonary disease, unspecified: Secondary | ICD-10-CM | POA: Diagnosis not present

## 2021-07-24 DIAGNOSIS — E785 Hyperlipidemia, unspecified: Secondary | ICD-10-CM | POA: Diagnosis not present

## 2021-07-31 NOTE — Progress Notes (Signed)
Carelink Summary Report / Loop Recorder 

## 2021-08-15 DIAGNOSIS — K7460 Unspecified cirrhosis of liver: Secondary | ICD-10-CM | POA: Diagnosis not present

## 2021-08-15 DIAGNOSIS — I8510 Secondary esophageal varices without bleeding: Secondary | ICD-10-CM | POA: Diagnosis not present

## 2021-08-15 DIAGNOSIS — K754 Autoimmune hepatitis: Secondary | ICD-10-CM | POA: Diagnosis not present

## 2021-08-15 DIAGNOSIS — D509 Iron deficiency anemia, unspecified: Secondary | ICD-10-CM | POA: Diagnosis not present

## 2021-08-15 DIAGNOSIS — K644 Residual hemorrhoidal skin tags: Secondary | ICD-10-CM | POA: Diagnosis not present

## 2021-08-17 ENCOUNTER — Other Ambulatory Visit: Payer: Self-pay | Admitting: Gastroenterology

## 2021-08-21 DIAGNOSIS — D509 Iron deficiency anemia, unspecified: Secondary | ICD-10-CM | POA: Diagnosis not present

## 2021-08-21 DIAGNOSIS — J449 Chronic obstructive pulmonary disease, unspecified: Secondary | ICD-10-CM | POA: Diagnosis not present

## 2021-08-21 DIAGNOSIS — E785 Hyperlipidemia, unspecified: Secondary | ICD-10-CM | POA: Diagnosis not present

## 2021-08-22 DIAGNOSIS — D509 Iron deficiency anemia, unspecified: Secondary | ICD-10-CM | POA: Diagnosis not present

## 2021-08-26 ENCOUNTER — Ambulatory Visit (INDEPENDENT_AMBULATORY_CARE_PROVIDER_SITE_OTHER): Payer: Medicare Other

## 2021-08-26 DIAGNOSIS — I639 Cerebral infarction, unspecified: Secondary | ICD-10-CM | POA: Diagnosis not present

## 2021-08-27 LAB — CUP PACEART REMOTE DEVICE CHECK
Date Time Interrogation Session: 20230304233003
Implantable Pulse Generator Implant Date: 20200526

## 2021-08-29 DIAGNOSIS — F339 Major depressive disorder, recurrent, unspecified: Secondary | ICD-10-CM | POA: Diagnosis not present

## 2021-08-29 DIAGNOSIS — I110 Hypertensive heart disease with heart failure: Secondary | ICD-10-CM | POA: Diagnosis not present

## 2021-08-29 DIAGNOSIS — I69354 Hemiplegia and hemiparesis following cerebral infarction affecting left non-dominant side: Secondary | ICD-10-CM | POA: Diagnosis not present

## 2021-08-29 DIAGNOSIS — D509 Iron deficiency anemia, unspecified: Secondary | ICD-10-CM | POA: Diagnosis not present

## 2021-09-06 NOTE — Progress Notes (Signed)
Carelink Summary Report / Loop Recorder 

## 2021-09-20 ENCOUNTER — Ambulatory Visit (INDEPENDENT_AMBULATORY_CARE_PROVIDER_SITE_OTHER): Payer: Medicare Other | Admitting: Gastroenterology

## 2021-09-20 ENCOUNTER — Other Ambulatory Visit (INDEPENDENT_AMBULATORY_CARE_PROVIDER_SITE_OTHER): Payer: Medicare Other

## 2021-09-20 ENCOUNTER — Encounter: Payer: Self-pay | Admitting: Gastroenterology

## 2021-09-20 VITALS — BP 140/62 | HR 76 | Ht 62.0 in | Wt 136.1 lb

## 2021-09-20 DIAGNOSIS — R140 Abdominal distension (gaseous): Secondary | ICD-10-CM

## 2021-09-20 DIAGNOSIS — K7581 Nonalcoholic steatohepatitis (NASH): Secondary | ICD-10-CM

## 2021-09-20 DIAGNOSIS — K7460 Unspecified cirrhosis of liver: Secondary | ICD-10-CM

## 2021-09-20 DIAGNOSIS — D509 Iron deficiency anemia, unspecified: Secondary | ICD-10-CM

## 2021-09-20 DIAGNOSIS — R109 Unspecified abdominal pain: Secondary | ICD-10-CM

## 2021-09-20 MED ORDER — FLUTICASONE PROPIONATE 0.05 % EX CREA: TOPICAL_CREAM | Freq: Two times a day (BID) | CUTANEOUS | 2 refills | Status: DC

## 2021-09-20 MED ORDER — LANSOPRAZOLE 30 MG PO CPDR: 30.0000 mg | DELAYED_RELEASE_CAPSULE | Freq: Every day | ORAL | 3 refills | Status: DC

## 2021-09-20 MED ORDER — FUROSEMIDE 40 MG PO TABS: 40.0000 mg | ORAL_TABLET | Freq: Every day | ORAL | 2 refills | Status: DC

## 2021-09-20 NOTE — Patient Instructions (Addendum)
If you are age 84 or older, your body mass index should be between 23-30. Your Body mass index is 24.9 kg/m?Marland Kitchen If this is out of the aforementioned range listed, please consider follow up with your Primary Care Provider. ? ?If you are age 70 or younger, your body mass index should be between 19-25. Your Body mass index is 24.9 kg/m?Marland Kitchen If this is out of the aformentioned range listed, please consider follow up with your Primary Care Provider.  ? ?________________________________________________________ ? ?The [facility] providers would like to encourage you to use [facility] to communicate with providers for non-urgent requests or questions.  Due to long hold times on the telephone, sending your provider a message by [facility] may be a faster and more efficient way to get a response.  Please allow 48 business hours for a response.  Please remember that this is for non-urgent requests.  ?_______________________________________________________ ? ?Please go to the lab on the 2nd floor [address] before you leave the office today.  ? ?Continue Prevacid '30mg'$  and a script for Lasix '40mg'$  daily has been sent as well. Fluticasone cream sent as well ? ?Colace 1 tab po a day if still with constipation in 2 weeks please call and speak to the nurse ? ?Avoid NSAIDs ? ?Referral has been sent to Dr Rogelia Boga. Please call us in 2 weeks if you haven't heard anything from their office ? ?You have been scheduled for an abdominal ultrasound at [facility] on 09-25-2021 at 10am. Please arrive 15 minutes prior to your appointment for registration. Make certain not to have anything to eat or drink 6 hours prior to your appointment. Should you need to reschedule your appointment, please contact radiology at [phone]. This test typically takes about 30 minutes to perform. ? ? ?Thank you, ? ?Dr. Jackquline Denmark ? ?Low-Sodium Eating Plan ?Sodium, which is an element that makes up salt, helps you maintain a healthy balance of fluids  in your body. Too much sodium can increase your blood pressure and cause fluid and waste to be held in your body. ?Your health care provider or dietitian may recommend following this plan if you have high blood pressure (hypertension), kidney disease, liver disease, or heart failure. Eating less sodium can help lower your blood pressure, reduce swelling, and protect your heart, liver, and kidneys. ?What are tips for following this plan? ?Reading food labels ?The Nutrition Facts label lists the amount of sodium in one serving of the food. If you eat more than one serving, you must multiply the listed amount of sodium by the number of servings. ?Choose foods with less than 140 mg of sodium per serving. ?Avoid foods with 300 mg of sodium or more per serving. ?Shopping ? ?Look for lower-sodium products, often labeled as "low-sodium" or "no salt added." ?Always check the sodium content, even if foods are labeled as "unsalted" or "no salt added." ?Buy fresh foods. ?Avoid canned foods and pre-made or frozen meals. ?Avoid canned, cured, or processed meats. ?Buy breads that have less than 80 mg of sodium per slice. ?Cooking ? ?Eat more home-cooked food and less restaurant, buffet, and fast food. ?Avoid adding salt when cooking. Use salt-free seasonings or herbs instead of table salt or sea salt. Check with your health care provider or pharmacist before using salt substitutes. ?Cook with plant-based oils, such as canola, sunflower, or olive oil. ?Meal planning ?When eating at a restaurant, ask that your food be prepared with less salt or no salt,  if possible. Avoid dishes labeled as brined, pickled, cured, smoked, or made with soy sauce, miso, or teriyaki sauce. ?Avoid foods that contain MSG (monosodium glutamate). MSG is sometimes added to [place] food, bouillon, and some canned foods. ?Make meals that can be grilled, baked, poached, roasted, or steamed. These are generally made with less sodium. ?General information ?Most  people on this plan should limit their sodium intake to 1,500-2,000 mg (milligrams) of sodium each day. ?What foods should I eat? ?Fruits ?Fresh, frozen, or canned fruit. Fruit juice. ?Vegetables ?Fresh or frozen vegetables. "No salt added" canned vegetables. "No salt added" tomato sauce and paste. Low-sodium or reduced-sodium tomato and vegetable juice. ?Grains ?Low-sodium cereals, including oats, puffed wheat and rice, and shredded wheat. Low-sodium crackers. Unsalted rice. Unsalted pasta. Low-sodium bread. Whole-grain breads and whole-grain pasta. ?Meats and other proteins ?Fresh or frozen (no salt added) meat, poultry, seafood, and fish. Low-sodium canned tuna and salmon. Unsalted nuts. Dried peas, beans, and lentils without added salt. Unsalted canned beans. Eggs. Unsalted nut butters. ?Dairy ?Milk. Soy milk. Cheese that is naturally low in sodium, such as ricotta cheese, fresh mozzarella, or Swiss cheese. Low-sodium or reduced-sodium cheese. Cream cheese. Yogurt. ?Seasonings and condiments ?Fresh and dried herbs and spices. Salt-free seasonings. Low-sodium mustard and ketchup. Sodium-free salad dressing. Sodium-free light mayonnaise. Fresh or refrigerated horseradish. Lemon juice. Vinegar. ?Other foods ?Homemade, reduced-sodium, or low-sodium soups. Unsalted popcorn and pretzels. Low-salt or salt-free chips. ?The items listed above may not be a complete list of foods and beverages you can eat. Contact a dietitian for more information. ?What foods should I avoid? ?Vegetables ?Sauerkraut, pickled vegetables, and relishes. Olives. [place] fries. Onion rings. Regular canned vegetables (not low-sodium or reduced-sodium). Regular canned tomato sauce and paste (not low-sodium or reduced-sodium). Regular tomato and vegetable juice (not low-sodium or reduced-sodium). Frozen vegetables in sauces. ?Grains ?Instant hot cereals. Bread stuffing, pancake, and biscuit mixes. Croutons. Seasoned rice or pasta mixes. Noodle soup  cups. Boxed or frozen macaroni and cheese. Regular salted crackers. Self-rising flour. ?Meats and other proteins ?Meat or fish that is salted, canned, smoked, spiced, or pickled. Precooked or cured meat, such as sausages or meat loaves. Berniece Salines. Ham. Pepperoni. Hot dogs. Corned beef. Chipped beef. Salt pork. Jerky. Pickled herring. Anchovies and sardines. Regular canned tuna. Salted nuts. ?Dairy ?Processed cheese and cheese spreads. Hard cheeses. Cheese curds. Blue cheese. Feta cheese. String cheese. Regular cottage cheese. Buttermilk. Canned milk. ?Fats and oils ?Salted butter. Regular margarine. Ghee. Bacon fat. ?Seasonings and condiments ?Onion salt, garlic salt, seasoned salt, table salt, and sea salt. Canned and packaged gravies. Worcestershire sauce. Tartar sauce. Barbecue sauce. Teriyaki sauce. Soy sauce, including reduced-sodium. Steak sauce. Fish sauce. Oyster sauce. Cocktail sauce. Horseradish that you find on the shelf. Regular ketchup and mustard. Meat flavorings and tenderizers. Bouillon cubes. Hot sauce. Pre-made or packaged marinades. Pre-made or packaged taco seasonings. Relishes. Regular salad dressings. Salsa. ?Other foods ?Salted popcorn and pretzels. Corn chips and puffs. Potato and tortilla chips. Canned or dried soups. Pizza. Frozen entrees and pot pies. ?The items listed above may not be a complete list of foods and beverages you should avoid. Contact a dietitian for more information. ?Summary ?Eating less sodium can help lower your blood pressure, reduce swelling, and protect your heart, liver, and kidneys. ?Most people on this plan should limit their sodium intake to 1,500-2,000 mg (milligrams) of sodium each day. ?Canned, boxed, and frozen foods are high in sodium. Restaurant foods, fast foods, and pizza are also very high in  sodium. You also get sodium by adding salt to food. ?Try to cook at home, eat more fresh fruits and vegetables, and eat less fast food and canned, processed, or prepared  foods. ?This information is not intended to replace advice given to you by your health care provider. Make sure you discuss any questions you have with your health care provider. ?Document Revised: 01/

## 2021-09-20 NOTE — Progress Notes (Signed)
? ? ?Chief Complaint:  ? ?Referring Provider:  Marco Collie, MD    ? ? ?ASSESSMENT AND PLAN;  ? ?#1.  Prolapsed internal hemorrhoids grade 4 with evidence of recent bleeding.  Not amenable to banding.  Neg colon 11/2020 as below. ? ?#2. NASH Cirrhosis with pHTN.  May have some autoimmune component.  Mild splenomegaly. H/O EV s/p EVL 11/2020.  Decided to hold off on rpt EGD d/t age. no thrombocytopenia, coagulopathy, HE and jaundice. ? ?#3. Peripheral edema but with ascites. No CHF on recent echo. ? ?#4.  Chronic IDA, S/P multiple iron infusions. Multifactorial d/t hypersplenism, slow GI bleeding d/t AVMs, pHTN gastropathy/early GAVE, mild chronic renal insufficiency. Neg colon 11/2020 except for incidental colonic AVMs s/p APC. ? ?Plan: ?-Obtain records regarding recent discharge summary from [facility].  ?-Continue Pravacid '30mg'$  po qd ?-Increase Lasix 40 mg p.o. once a day.  Monitor weight.  May add low-dose spironolactone if continued problems. ?-Low-salt diet. ?-[place] abdo ?-CBC, CMP, PT INR, AFP ?-Colace 1 tab po qd. if still with constipation in 2 weeks, start lactulose or MiraLAX. ?-fluticasone cream 0.05% generic 30g 1 bid PR x 10 days, 2 refills ?-Avoid NSAIDs ?-Appt with Dr Noberto Retort for possible hoidectomy  ? ? ? ?HPI:   ? ?Stephanie Love is a 86 y.o. female  ?With H/O CHF, COPD, chronic anemia, NASH cirrhosis, GERD, HLD, HTN, CVA, CKD2, S/P cholecystectomy 2013, vaginal hysterectomy ? ?For follow-up visit. ? ?Recent CVA on Jun 01, 2021- started on Baby ASA. ? ?Adm to [facility] Jan 2023 with IDA, heme positive stools requiring 2 units of PRBC.  Discharge summary is awaited.  She underwent EGD by Dr. Lyda Jester on 06/28/2021 which showed 2 gastric AVMs treated with APC, hiatal hernia, no significant esophageal varices. ? ?Comes to GI clinic for follow-up visit. ? ?Has been having rectal bleeding-mostly bright red in color, away from stool.  No diarrhea or constipation.  Rectal examination today (in presence of  Brooke) revealed grade 4 internal hemorrhoids with some red wale markings suggestive of recent bleed.  She has used Proctofoam cream without any benefit. ? ?Also complains of increasing abdominal distention ?Has been compliant with Lasix 20 mg p.o. once a day. ?Also has been compliant with low-salt diet. ?She told me that she got a lot of fluids during recent hospitalization. ? ?No fever or chills.  No weight loss.  No jaundice dark urine or pale stools. ? ?Denies having any change in mental status.  After stroke she has become "slow physically" ? ?RH - records ? ?Hoids - x 1 month ? ?No diarrhea or constipation ? ?Stopped Lasix  x 1 week, resulting in abdominal bloating and eventration of small umbilical hernia.  Had peripheral edema as well.  Lasix has been restarted.  She feels significantly better.  She is also getting potassium supplements. ? ?Has gained weight as below ? ?Wt Readings from Last 3 Encounters:  ?09/20/21 136 lb 2 oz (61.7 kg)  ?05/29/21 132 lb 8 oz (60.1 kg)  ?11/30/20 134 lb (60.8 kg)  ? ? ? ?From previous notes: ?With nonalcoholic liver cirrhosis - Dx on CT Oct 2016 with longstanding H/O fatty liver. ?-Neg acute hepatitis panel, celiac screen, positive ANA, positive AMA (132), positive ASMA (81).  May have some autoimmune component.  Refused liver biopsy. ?-S/P vaccines for hepatitis a and B. ?-EGD 11/2020: Gd II EV s/p EVL ?-EGD 06/2021 by Dr. Joycelyn Man AVMs s/p APC, HH, no varices. ?-EGD 10/2015 did not show any  esophageal varices.  However, had portal hypertensive gastropathy ?Early GAVE. ?-Negative colonoscopy January 2016 except for colonic polyps s/p polypectomy, moderate predominantly sigmoid diverticulosis.  Highly redundant colon.  Internal hemorrhoids.  Bx- TAs.  Hold off on routine colon d/t age ?-CT AP 09/2016: Cirrhosis without ascites or splenomegaly. ?-[place] 08/2016: Liver cirrhosis, borderline splenomegaly measuring 12 cm.  No focal lesions. ?-[place] 06/04/2020: Cirrhosis, no  focal hepatic lesion, ascites, mild splenomegaly. ? ?She had 2D echo 10/2018 which showed ejection fraction of 60 to 65%. ? ?Wt Readings from Last 3 Encounters:  ?09/20/21 136 lb 2 oz (61.7 kg)  ?05/29/21 132 lb 8 oz (60.1 kg)  ?11/30/20 134 lb (60.8 kg)  ? ?Recent GI work-up: ? ?EGD 11/2020 ?- Grade II esophageal varices. Completely eradicated. Banded. ?- Portal hypertensive gastropathy. ?- Normal examined duodenum. Biopsied. ? ?-EGD 06/2021 by Dr. Joycelyn Man AVMs s/p APC, HH, no varices ? ?Colonoscopy 11/2020 ?- Three non-bleeding colonic angiodysplastic lesions. Treated with argon plasma ?coagulation (APC). ?- Diverticulosis in the sigmoid colon. ?- Non-bleeding internal hemorrhoids. ?- The examined portion of the ileum was normal. ?- The examination was otherwise normal on direct and retroflexion views. ?- No specimens collected. ?Past Medical History:  ?Diagnosis Date  ? Anemia   ? Arthritis   ? Autoimmune hepatitis ([place])   ? Cirrhosis ([place])   ? Congestive heart failure ([place])   ? COPD (chronic obstructive pulmonary disease) ([place])   ? Depression   ? Diverticulosis   ? GERD (gastroesophageal reflux disease)   ? Hepatitis A virus infection 1963  ? History of colon polyps   ? History of colon polyps   ? Hyperlipidemia   ? Hypertension   ? Hypothyroid   ? IDA (iron deficiency anemia)   ? Does iron infusion  ? Microhematuria   ? NASH (nonalcoholic steatohepatitis)   ? Neuropathy   ? Osteoarthritis   ? Rectal prolapse   ? Stage 2 chronic kidney disease   ? Stroke [facility]) 11/13/2018  ? Stroke [facility])   ? UTI (urinary tract infection)   ? ? ?Past Surgical History:  ?Procedure Laterality Date  ? ABDOMINAL HYSTERECTOMY  2012  ? AMPUTATION TOE    ? right 2nd toe   ? BIOPSY  12/04/2020  ? Procedure: BIOPSY;  Surgeon: Jackquline Denmark, MD;  Location: [facility] ENDOSCOPY;  Service: Endoscopy;;  ? BREAST SURGERY    ? Breast tumor removed non-malignant  ? BUNIONECTOMY  05/01/2016  ? CHOLECYSTECTOMY  2013  ? COLONOSCOPY  07/12/2014  ?  Colonic polyps status post polypectomy. Moderate predominanlty sigmoid diverticulosis. High redundant colon.  ? COLONOSCOPY WITH PROPOFOL N/A 12/04/2020  ? Procedure: COLONOSCOPY WITH PROPOFOL;  Surgeon: Jackquline Denmark, MD;  Location: [facility] ENDOSCOPY;  Service: Endoscopy;  Laterality: N/A;  ? ESOPHAGEAL BANDING  12/04/2020  ? Procedure: ESOPHAGEAL BANDING;  Surgeon: Jackquline Denmark, MD;  Location: [facility] ENDOSCOPY;  Service: Endoscopy;;  ? ESOPHAGOGASTRODUODENOSCOPY  11/14/2015  ? Mild Candida esophagitis. Mild gastritis. Incidental gastic polyps (status post polypectomy x2)  ? ESOPHAGOGASTRODUODENOSCOPY (EGD) WITH PROPOFOL N/A 12/04/2020  ? Procedure: ESOPHAGOGASTRODUODENOSCOPY (EGD) WITH PROPOFOL;  Surgeon: Jackquline Denmark, MD;  Location: [facility] ENDOSCOPY;  Service: Endoscopy;  Laterality: N/A;  ? HOT HEMOSTASIS N/A 12/04/2020  ? Procedure: HOT HEMOSTASIS (ARGON PLASMA COAGULATION/BICAP);  Surgeon: Jackquline Denmark, MD;  Location: [facility];  Service: Endoscopy;  Laterality: N/A;  ? LOOP RECORDER INSERTION N/A 11/16/2018  ? Procedure: LOOP RECORDER INSERTION;  Surgeon: Thompson Grayer, MD;  Location: [facility];  Service: Cardiovascular;  Laterality: N/A;  ? PARTIAL HIP ARTHROPLASTY Left 12/2018  ? [facility]  ? vaginal lesion removed    ? Precancerous  ? ? ?Family History  ?Problem Relation Age of Onset  ? Cancer Mother   ? Diabetes Father   ? Heart disease Father   ? ? ?Social History  ? ?Tobacco Use  ? Smoking status: Former  ?  Types: Cigarettes  ?  Quit date: 11/16/1981  ?  Years since quitting: 39.8  ? Smokeless tobacco: Never  ?Vaping Use  ? Vaping Use: Never used  ?Substance Use Topics  ? Alcohol use: Not Currently  ? Drug use: Never  ? ? ?Current Outpatient Medications  ?Medication Sig Dispense Refill  ? alendronate (FOSAMAX) 70 MG tablet Take 70 mg by mouth once a week.    ? arformoterol (BROVANA) 15 MCG/2ML NEBU Take 15 mcg by nebulization 2 (two) times daily.    ? budesonide (PULMICORT) 0.5 MG/2ML nebulizer  solution Take 0.5 mg by nebulization 2 (two) times daily.    ? escitalopram (LEXAPRO) 10 MG tablet Take 10 mg by mouth daily.    ? furosemide (LASIX) 20 MG tablet TAKE 1 TABLET BY MOUTH EVERY DAY 90 tablet 2  ? la

## 2021-09-21 DIAGNOSIS — K7460 Unspecified cirrhosis of liver: Secondary | ICD-10-CM | POA: Diagnosis not present

## 2021-09-21 DIAGNOSIS — D509 Iron deficiency anemia, unspecified: Secondary | ICD-10-CM | POA: Diagnosis not present

## 2021-09-21 LAB — COMPREHENSIVE METABOLIC PANEL
ALT: 29 IU/L (ref 0–32)
AST: 50 IU/L — ABNORMAL HIGH (ref 0–40)
Albumin/Globulin Ratio: 0.9 — ABNORMAL LOW (ref 1.2–2.2)
Albumin: 3.2 g/dL — ABNORMAL LOW (ref 3.6–4.6)
Alkaline Phosphatase: 169 IU/L — ABNORMAL HIGH (ref 44–121)
BUN/Creatinine Ratio: 17 (ref 12–28)
BUN: 16 mg/dL (ref 8–27)
Bilirubin Total: 0.4 mg/dL (ref 0.0–1.2)
CO2: 19 mmol/L — ABNORMAL LOW (ref 20–29)
Calcium: 8.8 mg/dL (ref 8.7–10.3)
Chloride: 106 mmol/L (ref 96–106)
Creatinine, Ser: 0.94 mg/dL (ref 0.57–1.00)
Globulin, Total: 3.4 g/dL (ref 1.5–4.5)
Glucose: 135 mg/dL — ABNORMAL HIGH (ref 70–99)
Potassium: 4.2 mmol/L (ref 3.5–5.2)
Sodium: 143 mmol/L (ref 134–144)
Total Protein: 6.6 g/dL (ref 6.0–8.5)
eGFR: 60 mL/min/{1.73_m2} (ref >59)

## 2021-09-21 LAB — PROTIME-INR
INR: 1.1 (ref 0.9–1.2)
Prothrombin Time: 11.4 s (ref 9.1–12.0)

## 2021-09-21 LAB — CBC WITH DIFFERENTIAL/PLATELET
Basophils Absolute: 0 10*3/uL (ref 0.0–0.2)
Basos: 1 %
EOS (ABSOLUTE): 0.1 10*3/uL (ref 0.0–0.4)
Eos: 3 %
Hematocrit: 33.7 % — ABNORMAL LOW (ref 34.0–46.6)
Hemoglobin: 10.9 g/dL — ABNORMAL LOW (ref 11.1–15.9)
Immature Grans (Abs): 0 10*3/uL (ref 0.0–0.1)
Immature Granulocytes: 0 %
Lymphocytes Absolute: 0.7 10*3/uL (ref 0.7–3.1)
Lymphs: 16 %
MCH: 28.2 pg (ref 26.6–33.0)
MCHC: 32.3 g/dL (ref 31.5–35.7)
MCV: 87 fL (ref 79–97)
Monocytes Absolute: 0.6 10*3/uL (ref 0.1–0.9)
Monocytes: 13 %
Neutrophils Absolute: 3 10*3/uL (ref 1.4–7.0)
Neutrophils: 67 %
Platelets: 153 10*3/uL (ref 150–450)
RBC: 3.86 x10E6/uL (ref 3.77–5.28)
RDW: 18.9 % — ABNORMAL HIGH (ref 11.7–15.4)
WBC: 4.5 10*3/uL (ref 3.4–10.8)

## 2021-09-21 LAB — AFP TUMOR MARKER: AFP, Serum, Tumor Marker: <1.8 ng/mL (ref 0.0–8.7)

## 2021-09-23 ENCOUNTER — Telehealth: Payer: Self-pay

## 2021-09-23 NOTE — Telephone Encounter (Signed)
Referral sent to Dr Noberto Retort for possible hemorrhoidectomy to [phone]. Phone number [phone]. Regular fax number [phone] but it was failing so secondary number was given and went through successfully. ? ?Called and they stated they have it and will start on it ?

## 2021-09-24 DIAGNOSIS — D509 Iron deficiency anemia, unspecified: Secondary | ICD-10-CM | POA: Diagnosis not present

## 2021-09-24 DIAGNOSIS — R051 Acute cough: Secondary | ICD-10-CM | POA: Diagnosis not present

## 2021-09-24 DIAGNOSIS — K7460 Unspecified cirrhosis of liver: Secondary | ICD-10-CM | POA: Diagnosis not present

## 2021-09-24 DIAGNOSIS — Z20822 Contact with and (suspected) exposure to covid-19: Secondary | ICD-10-CM | POA: Diagnosis not present

## 2021-09-24 DIAGNOSIS — J441 Chronic obstructive pulmonary disease with (acute) exacerbation: Secondary | ICD-10-CM | POA: Diagnosis not present

## 2021-09-25 ENCOUNTER — Ambulatory Visit (HOSPITAL_BASED_OUTPATIENT_CLINIC_OR_DEPARTMENT_OTHER)
Admission: RE | Admit: 2021-09-25 | Discharge: 2021-09-25 | Disposition: A | Discharge status: Home / Self Care | Payer: Medicare Other | Source: Ambulatory Visit | Attending: Gastroenterology | Admitting: Gastroenterology

## 2021-09-25 DIAGNOSIS — K7460 Unspecified cirrhosis of liver: Secondary | ICD-10-CM | POA: Diagnosis not present

## 2021-09-25 DIAGNOSIS — D509 Iron deficiency anemia, unspecified: Secondary | ICD-10-CM | POA: Insufficient documentation

## 2021-09-25 DIAGNOSIS — R109 Unspecified abdominal pain: Secondary | ICD-10-CM | POA: Insufficient documentation

## 2021-09-25 DIAGNOSIS — K7581 Nonalcoholic steatohepatitis (NASH): Secondary | ICD-10-CM | POA: Insufficient documentation

## 2021-09-25 DIAGNOSIS — K7469 Other cirrhosis of liver: Secondary | ICD-10-CM | POA: Diagnosis not present

## 2021-09-25 DIAGNOSIS — R140 Abdominal distension (gaseous): Secondary | ICD-10-CM | POA: Insufficient documentation

## 2021-09-30 ENCOUNTER — Ambulatory Visit (INDEPENDENT_AMBULATORY_CARE_PROVIDER_SITE_OTHER): Payer: Medicare Other

## 2021-09-30 DIAGNOSIS — I639 Cerebral infarction, unspecified: Secondary | ICD-10-CM

## 2021-09-30 NOTE — Telephone Encounter (Signed)
Appt 4-25 at 2:45pm. Patient is aware ?

## 2021-10-01 DIAGNOSIS — K7460 Unspecified cirrhosis of liver: Secondary | ICD-10-CM | POA: Diagnosis not present

## 2021-10-01 DIAGNOSIS — J449 Chronic obstructive pulmonary disease, unspecified: Secondary | ICD-10-CM | POA: Diagnosis not present

## 2021-10-01 DIAGNOSIS — R531 Weakness: Secondary | ICD-10-CM | POA: Diagnosis not present

## 2021-10-01 DIAGNOSIS — D509 Iron deficiency anemia, unspecified: Secondary | ICD-10-CM | POA: Diagnosis not present

## 2021-10-02 LAB — CUP PACEART REMOTE DEVICE CHECK
Date Time Interrogation Session: 20230409230825
Implantable Pulse Generator Implant Date: 20200526

## 2021-10-10 DIAGNOSIS — Z0000 Encounter for general adult medical examination without abnormal findings: Secondary | ICD-10-CM | POA: Diagnosis not present

## 2021-10-10 DIAGNOSIS — Z139 Encounter for screening, unspecified: Secondary | ICD-10-CM | POA: Diagnosis not present

## 2021-10-10 DIAGNOSIS — Z1331 Encounter for screening for depression: Secondary | ICD-10-CM | POA: Diagnosis not present

## 2021-10-10 DIAGNOSIS — Z7189 Other specified counseling: Secondary | ICD-10-CM | POA: Diagnosis not present

## 2021-10-10 DIAGNOSIS — Z1339 Encounter for screening examination for other mental health and behavioral disorders: Secondary | ICD-10-CM | POA: Diagnosis not present

## 2021-10-10 DIAGNOSIS — K7460 Unspecified cirrhosis of liver: Secondary | ICD-10-CM | POA: Diagnosis not present

## 2021-10-10 DIAGNOSIS — Z136 Encounter for screening for cardiovascular disorders: Secondary | ICD-10-CM | POA: Diagnosis not present

## 2021-10-10 DIAGNOSIS — D509 Iron deficiency anemia, unspecified: Secondary | ICD-10-CM | POA: Diagnosis not present

## 2021-10-15 DIAGNOSIS — K648 Other hemorrhoids: Secondary | ICD-10-CM | POA: Insufficient documentation

## 2021-10-17 NOTE — Progress Notes (Signed)
Carelink Summary Report / Loop Recorder 

## 2021-10-21 DIAGNOSIS — D509 Iron deficiency anemia, unspecified: Secondary | ICD-10-CM | POA: Diagnosis not present

## 2021-10-21 DIAGNOSIS — K7460 Unspecified cirrhosis of liver: Secondary | ICD-10-CM | POA: Diagnosis not present

## 2021-10-24 DIAGNOSIS — I69359 Hemiplegia and hemiparesis following cerebral infarction affecting unspecified side: Secondary | ICD-10-CM | POA: Diagnosis not present

## 2021-10-24 DIAGNOSIS — D509 Iron deficiency anemia, unspecified: Secondary | ICD-10-CM | POA: Diagnosis not present

## 2021-10-28 DIAGNOSIS — D509 Iron deficiency anemia, unspecified: Secondary | ICD-10-CM | POA: Diagnosis not present

## 2021-11-04 ENCOUNTER — Ambulatory Visit (INDEPENDENT_AMBULATORY_CARE_PROVIDER_SITE_OTHER): Payer: Medicare Other

## 2021-11-04 DIAGNOSIS — I639 Cerebral infarction, unspecified: Secondary | ICD-10-CM

## 2021-11-04 DIAGNOSIS — D509 Iron deficiency anemia, unspecified: Secondary | ICD-10-CM | POA: Diagnosis not present

## 2021-11-04 LAB — CUP PACEART REMOTE DEVICE CHECK
Date Time Interrogation Session: 20230512231010
Implantable Pulse Generator Implant Date: 20200526

## 2021-11-07 DIAGNOSIS — Z23 Encounter for immunization: Secondary | ICD-10-CM | POA: Diagnosis not present

## 2021-11-07 DIAGNOSIS — D509 Iron deficiency anemia, unspecified: Secondary | ICD-10-CM | POA: Diagnosis not present

## 2021-11-21 DIAGNOSIS — J449 Chronic obstructive pulmonary disease, unspecified: Secondary | ICD-10-CM | POA: Diagnosis not present

## 2021-11-21 DIAGNOSIS — I69359 Hemiplegia and hemiparesis following cerebral infarction affecting unspecified side: Secondary | ICD-10-CM | POA: Diagnosis not present

## 2021-11-25 NOTE — Progress Notes (Signed)
Carelink Summary Report / Loop Recorder 

## 2021-11-28 DIAGNOSIS — R490 Dysphonia: Secondary | ICD-10-CM | POA: Diagnosis not present

## 2021-11-28 DIAGNOSIS — D509 Iron deficiency anemia, unspecified: Secondary | ICD-10-CM | POA: Diagnosis not present

## 2021-12-03 ENCOUNTER — Other Ambulatory Visit: Payer: Self-pay | Admitting: *Deleted

## 2021-12-03 DIAGNOSIS — M1711 Unilateral primary osteoarthritis, right knee: Secondary | ICD-10-CM | POA: Diagnosis not present

## 2021-12-03 NOTE — Patient Outreach (Signed)
[facility]) Care Management  12/03/2021  Stephanie Love [birth year] [phone]   Telephone Screen-Unsuccessful  RN attempted outreach call however unsuccessful. RN able to leave a HIPAA approved voice message requesting a call back.  Will attempt another outreach call over the next week for pending services.  Raina Mina, RN Care Management Coordinator [place] Office [phone]

## 2021-12-09 ENCOUNTER — Ambulatory Visit (INDEPENDENT_AMBULATORY_CARE_PROVIDER_SITE_OTHER): Payer: Medicare Other

## 2021-12-09 DIAGNOSIS — I639 Cerebral infarction, unspecified: Secondary | ICD-10-CM

## 2021-12-11 LAB — CUP PACEART REMOTE DEVICE CHECK
Date Time Interrogation Session: 20230614232207
Implantable Pulse Generator Implant Date: 20200526

## 2021-12-12 ENCOUNTER — Ambulatory Visit: Payer: Self-pay | Admitting: *Deleted

## 2021-12-19 DIAGNOSIS — D509 Iron deficiency anemia, unspecified: Secondary | ICD-10-CM | POA: Diagnosis not present

## 2021-12-19 DIAGNOSIS — Z6823 Body mass index (BMI) 23.0-23.9, adult: Secondary | ICD-10-CM | POA: Diagnosis not present

## 2021-12-20 DIAGNOSIS — D509 Iron deficiency anemia, unspecified: Secondary | ICD-10-CM | POA: Diagnosis not present

## 2021-12-21 DIAGNOSIS — I69359 Hemiplegia and hemiparesis following cerebral infarction affecting unspecified side: Secondary | ICD-10-CM | POA: Diagnosis not present

## 2021-12-21 DIAGNOSIS — J449 Chronic obstructive pulmonary disease, unspecified: Secondary | ICD-10-CM | POA: Diagnosis not present

## 2021-12-31 NOTE — Progress Notes (Signed)
Carelink Summary Report / Loop Recorder 

## 2022-01-02 ENCOUNTER — Encounter: Payer: Self-pay | Admitting: *Deleted

## 2022-01-07 DIAGNOSIS — M79604 Pain in right leg: Secondary | ICD-10-CM | POA: Diagnosis not present

## 2022-01-07 DIAGNOSIS — L97819 Non-pressure chronic ulcer of other part of right lower leg with unspecified severity: Secondary | ICD-10-CM | POA: Diagnosis not present

## 2022-01-07 DIAGNOSIS — I83018 Varicose veins of right lower extremity with ulcer other part of lower leg: Secondary | ICD-10-CM | POA: Diagnosis not present

## 2022-01-07 DIAGNOSIS — D696 Thrombocytopenia, unspecified: Secondary | ICD-10-CM | POA: Diagnosis not present

## 2022-01-07 DIAGNOSIS — Z6823 Body mass index (BMI) 23.0-23.9, adult: Secondary | ICD-10-CM | POA: Diagnosis not present

## 2022-01-07 DIAGNOSIS — L97919 Non-pressure chronic ulcer of unspecified part of right lower leg with unspecified severity: Secondary | ICD-10-CM | POA: Diagnosis not present

## 2022-01-07 DIAGNOSIS — M7989 Other specified soft tissue disorders: Secondary | ICD-10-CM | POA: Diagnosis not present

## 2022-01-07 DIAGNOSIS — L03115 Cellulitis of right lower limb: Secondary | ICD-10-CM | POA: Diagnosis not present

## 2022-01-07 DIAGNOSIS — D509 Iron deficiency anemia, unspecified: Secondary | ICD-10-CM | POA: Diagnosis not present

## 2022-01-07 DIAGNOSIS — K7460 Unspecified cirrhosis of liver: Secondary | ICD-10-CM | POA: Diagnosis not present

## 2022-01-08 DIAGNOSIS — D509 Iron deficiency anemia, unspecified: Secondary | ICD-10-CM | POA: Diagnosis not present

## 2022-01-09 DIAGNOSIS — I471 Supraventricular tachycardia: Secondary | ICD-10-CM | POA: Diagnosis not present

## 2022-01-09 DIAGNOSIS — K754 Autoimmune hepatitis: Secondary | ICD-10-CM | POA: Diagnosis not present

## 2022-01-09 DIAGNOSIS — Z6823 Body mass index (BMI) 23.0-23.9, adult: Secondary | ICD-10-CM | POA: Diagnosis not present

## 2022-01-09 DIAGNOSIS — D509 Iron deficiency anemia, unspecified: Secondary | ICD-10-CM | POA: Diagnosis not present

## 2022-01-10 DIAGNOSIS — K7460 Unspecified cirrhosis of liver: Secondary | ICD-10-CM | POA: Diagnosis not present

## 2022-01-10 DIAGNOSIS — K754 Autoimmune hepatitis: Secondary | ICD-10-CM | POA: Diagnosis not present

## 2022-01-10 DIAGNOSIS — Z6823 Body mass index (BMI) 23.0-23.9, adult: Secondary | ICD-10-CM | POA: Diagnosis not present

## 2022-01-10 DIAGNOSIS — D509 Iron deficiency anemia, unspecified: Secondary | ICD-10-CM | POA: Diagnosis not present

## 2022-01-13 ENCOUNTER — Ambulatory Visit (INDEPENDENT_AMBULATORY_CARE_PROVIDER_SITE_OTHER): Payer: Medicare Other

## 2022-01-13 DIAGNOSIS — I639 Cerebral infarction, unspecified: Secondary | ICD-10-CM

## 2022-01-14 LAB — CUP PACEART REMOTE DEVICE CHECK
Date Time Interrogation Session: 20230717232022
Implantable Pulse Generator Implant Date: 20200526

## 2022-01-15 ENCOUNTER — Ambulatory Visit: Payer: Medicare Other | Admitting: Physician Assistant

## 2022-01-15 DIAGNOSIS — D509 Iron deficiency anemia, unspecified: Secondary | ICD-10-CM | POA: Diagnosis not present

## 2022-01-21 DIAGNOSIS — J449 Chronic obstructive pulmonary disease, unspecified: Secondary | ICD-10-CM | POA: Diagnosis not present

## 2022-01-21 DIAGNOSIS — I69359 Hemiplegia and hemiparesis following cerebral infarction affecting unspecified side: Secondary | ICD-10-CM | POA: Diagnosis not present

## 2022-01-21 DIAGNOSIS — D509 Iron deficiency anemia, unspecified: Secondary | ICD-10-CM | POA: Diagnosis not present

## 2022-01-21 DIAGNOSIS — Z6823 Body mass index (BMI) 23.0-23.9, adult: Secondary | ICD-10-CM | POA: Diagnosis not present

## 2022-01-21 DIAGNOSIS — L97919 Non-pressure chronic ulcer of unspecified part of right lower leg with unspecified severity: Secondary | ICD-10-CM | POA: Diagnosis not present

## 2022-01-30 DIAGNOSIS — Z6823 Body mass index (BMI) 23.0-23.9, adult: Secondary | ICD-10-CM | POA: Diagnosis not present

## 2022-01-30 DIAGNOSIS — I872 Venous insufficiency (chronic) (peripheral): Secondary | ICD-10-CM | POA: Diagnosis not present

## 2022-01-30 DIAGNOSIS — L97301 Non-pressure chronic ulcer of unspecified ankle limited to breakdown of skin: Secondary | ICD-10-CM | POA: Diagnosis not present

## 2022-01-30 DIAGNOSIS — D509 Iron deficiency anemia, unspecified: Secondary | ICD-10-CM | POA: Diagnosis not present

## 2022-02-03 DIAGNOSIS — Z6823 Body mass index (BMI) 23.0-23.9, adult: Secondary | ICD-10-CM | POA: Diagnosis not present

## 2022-02-03 DIAGNOSIS — L97301 Non-pressure chronic ulcer of unspecified ankle limited to breakdown of skin: Secondary | ICD-10-CM | POA: Diagnosis not present

## 2022-02-03 DIAGNOSIS — I872 Venous insufficiency (chronic) (peripheral): Secondary | ICD-10-CM | POA: Diagnosis not present

## 2022-02-06 DIAGNOSIS — S81801A Unspecified open wound, right lower leg, initial encounter: Secondary | ICD-10-CM | POA: Diagnosis not present

## 2022-02-11 DIAGNOSIS — S81801A Unspecified open wound, right lower leg, initial encounter: Secondary | ICD-10-CM | POA: Diagnosis not present

## 2022-02-13 DIAGNOSIS — D509 Iron deficiency anemia, unspecified: Secondary | ICD-10-CM | POA: Diagnosis not present

## 2022-02-13 DIAGNOSIS — Z6823 Body mass index (BMI) 23.0-23.9, adult: Secondary | ICD-10-CM | POA: Diagnosis not present

## 2022-02-13 LAB — CUP PACEART REMOTE DEVICE CHECK
Date Time Interrogation Session: 20230819232024
Implantable Pulse Generator Implant Date: 20200526

## 2022-02-14 ENCOUNTER — Ambulatory Visit (INDEPENDENT_AMBULATORY_CARE_PROVIDER_SITE_OTHER): Payer: Medicare Other

## 2022-02-14 DIAGNOSIS — I639 Cerebral infarction, unspecified: Secondary | ICD-10-CM | POA: Diagnosis not present

## 2022-02-17 DIAGNOSIS — D509 Iron deficiency anemia, unspecified: Secondary | ICD-10-CM | POA: Diagnosis not present

## 2022-02-19 NOTE — Progress Notes (Signed)
Remote reviewed Stable rhythm Histograms reviewed

## 2022-02-21 DIAGNOSIS — I8500 Esophageal varices without bleeding: Secondary | ICD-10-CM | POA: Diagnosis not present

## 2022-02-21 DIAGNOSIS — K754 Autoimmune hepatitis: Secondary | ICD-10-CM | POA: Diagnosis not present

## 2022-02-21 NOTE — Progress Notes (Signed)
Carelink Summary Report / Loop Recorder 

## 2022-02-25 DIAGNOSIS — D509 Iron deficiency anemia, unspecified: Secondary | ICD-10-CM | POA: Diagnosis not present

## 2022-02-26 DIAGNOSIS — S81801A Unspecified open wound, right lower leg, initial encounter: Secondary | ICD-10-CM | POA: Diagnosis not present

## 2022-02-27 DIAGNOSIS — Z6823 Body mass index (BMI) 23.0-23.9, adult: Secondary | ICD-10-CM | POA: Diagnosis not present

## 2022-02-27 DIAGNOSIS — D509 Iron deficiency anemia, unspecified: Secondary | ICD-10-CM | POA: Diagnosis not present

## 2022-03-11 NOTE — Progress Notes (Signed)
Carelink Summary Report / Loop Recorder 

## 2022-03-18 DIAGNOSIS — E039 Hypothyroidism, unspecified: Secondary | ICD-10-CM | POA: Diagnosis not present

## 2022-03-18 DIAGNOSIS — D649 Anemia, unspecified: Secondary | ICD-10-CM | POA: Diagnosis not present

## 2022-03-18 DIAGNOSIS — K7460 Unspecified cirrhosis of liver: Secondary | ICD-10-CM | POA: Diagnosis not present

## 2022-03-18 DIAGNOSIS — I83019 Varicose veins of right lower extremity with ulcer of unspecified site: Secondary | ICD-10-CM | POA: Diagnosis not present

## 2022-03-18 DIAGNOSIS — L97919 Non-pressure chronic ulcer of unspecified part of right lower leg with unspecified severity: Secondary | ICD-10-CM | POA: Diagnosis not present

## 2022-03-18 DIAGNOSIS — Z8673 Personal history of transient ischemic attack (TIA), and cerebral infarction without residual deficits: Secondary | ICD-10-CM | POA: Diagnosis not present

## 2022-03-19 ENCOUNTER — Ambulatory Visit (INDEPENDENT_AMBULATORY_CARE_PROVIDER_SITE_OTHER): Payer: Medicare Other

## 2022-03-19 DIAGNOSIS — I639 Cerebral infarction, unspecified: Secondary | ICD-10-CM | POA: Diagnosis not present

## 2022-03-19 LAB — CUP PACEART REMOTE DEVICE CHECK
Date Time Interrogation Session: 20230921232845
Implantable Pulse Generator Implant Date: 20200526

## 2022-03-20 ENCOUNTER — Other Ambulatory Visit: Payer: Self-pay

## 2022-03-20 DIAGNOSIS — R3129 Other microscopic hematuria: Secondary | ICD-10-CM | POA: Insufficient documentation

## 2022-03-20 DIAGNOSIS — L97919 Non-pressure chronic ulcer of unspecified part of right lower leg with unspecified severity: Secondary | ICD-10-CM | POA: Insufficient documentation

## 2022-03-20 DIAGNOSIS — N182 Chronic kidney disease, stage 2 (mild): Secondary | ICD-10-CM | POA: Insufficient documentation

## 2022-03-20 DIAGNOSIS — G629 Polyneuropathy, unspecified: Secondary | ICD-10-CM | POA: Insufficient documentation

## 2022-03-20 DIAGNOSIS — N390 Urinary tract infection, site not specified: Secondary | ICD-10-CM | POA: Insufficient documentation

## 2022-03-20 DIAGNOSIS — K449 Diaphragmatic hernia without obstruction or gangrene: Secondary | ICD-10-CM | POA: Insufficient documentation

## 2022-03-20 DIAGNOSIS — Z860100 Personal history of colon polyps, unspecified: Secondary | ICD-10-CM | POA: Insufficient documentation

## 2022-03-20 DIAGNOSIS — M1990 Unspecified osteoarthritis, unspecified site: Secondary | ICD-10-CM | POA: Insufficient documentation

## 2022-03-20 DIAGNOSIS — I639 Cerebral infarction, unspecified: Secondary | ICD-10-CM | POA: Insufficient documentation

## 2022-03-20 DIAGNOSIS — I10 Essential (primary) hypertension: Secondary | ICD-10-CM | POA: Insufficient documentation

## 2022-03-20 DIAGNOSIS — K754 Autoimmune hepatitis: Secondary | ICD-10-CM | POA: Insufficient documentation

## 2022-03-20 DIAGNOSIS — K5790 Diverticulosis of intestine, part unspecified, without perforation or abscess without bleeding: Secondary | ICD-10-CM | POA: Insufficient documentation

## 2022-03-20 DIAGNOSIS — I509 Heart failure, unspecified: Secondary | ICD-10-CM | POA: Insufficient documentation

## 2022-03-20 DIAGNOSIS — K7581 Nonalcoholic steatohepatitis (NASH): Secondary | ICD-10-CM | POA: Insufficient documentation

## 2022-03-20 DIAGNOSIS — K219 Gastro-esophageal reflux disease without esophagitis: Secondary | ICD-10-CM | POA: Insufficient documentation

## 2022-03-20 DIAGNOSIS — K7460 Unspecified cirrhosis of liver: Secondary | ICD-10-CM | POA: Insufficient documentation

## 2022-03-20 DIAGNOSIS — K31819 Angiodysplasia of stomach and duodenum without bleeding: Secondary | ICD-10-CM | POA: Insufficient documentation

## 2022-03-20 DIAGNOSIS — F32A Depression, unspecified: Secondary | ICD-10-CM | POA: Insufficient documentation

## 2022-03-20 DIAGNOSIS — D649 Anemia, unspecified: Secondary | ICD-10-CM | POA: Insufficient documentation

## 2022-03-20 DIAGNOSIS — K623 Rectal prolapse: Secondary | ICD-10-CM | POA: Insufficient documentation

## 2022-03-23 ENCOUNTER — Other Ambulatory Visit: Payer: Self-pay | Admitting: Oncology

## 2022-03-23 DIAGNOSIS — D649 Anemia, unspecified: Secondary | ICD-10-CM

## 2022-03-23 DIAGNOSIS — K754 Autoimmune hepatitis: Secondary | ICD-10-CM | POA: Diagnosis not present

## 2022-03-23 DIAGNOSIS — D638 Anemia in other chronic diseases classified elsewhere: Secondary | ICD-10-CM

## 2022-03-23 DIAGNOSIS — I8500 Esophageal varices without bleeding: Secondary | ICD-10-CM | POA: Diagnosis not present

## 2022-03-23 NOTE — Progress Notes (Signed)
[facility]  [address] [place],  [place]  [address] [phone]  Clinic Day:  03/24/2022  Referring physician: Marco Collie, MD   HISTORY OF PRESENT ILLNESS:  The patient is a 86 y.o. female  who I was asked to consult upon for anemia.  Recent labs showed a low hemoglobin of 10.9.   The patient denies having any overt forms of blood loss to explain her anemia.  Of note, she had both a colonoscopy and EGD done approximately 2 years ago which apparently did not reveal anything particularly ominous.  Of note, she has needed both iron infusions and blood transfusions over these past few years.  She last received an iron infusion in August 2023.  She claims she does have dark-colored stools that are occasionally coffee-ground in appearance.  She has tried taking oral iron in the past, but it caused significant GI upset.  To her knowledge, there is no family history of anemia or other hematologic disorders.  PAST MEDICAL HISTORY:   Past Medical History:  Diagnosis Date   Abnormality of gait as late effect of cerebrovascular accident (CVA) 12/13/2018   Aftercare following surgery 05/06/2016   Anemia    Anemia of chronic disease    Arthritis    Ataxia    Autoimmune hepatitis ([place])    Benign essential HTN    Chronic obstructive pulmonary disease (HCC)    Cirrhosis (HCC)    Cirrhosis (HCC)    Congestive heart failure (HCC)    COPD exacerbation (HCC)    CVA (cerebral vascular accident) ([place])    Depression    Diverticulosis    Dyslipidemia    Embolic stroke ([place]) 09/13/5571   Esophageal varices without bleeding (HCC)    Essential hypertension    Gastric AVM    GERD (gastroesophageal reflux disease)    Hammer toe of right foot 09/20/2015   Heme positive stool    Hepatic cirrhosis ([place])    Hepatitis A virus infection 1963   Hiatal hernia    History of colon polyps    History of colon polyps    Hyperlipidemia    Hypertension    Hypoalbuminemia due  to protein-calorie malnutrition (HCC)    Hypothyroidism    Iron deficiency anemia    Ischemic stroke ([place]) 11/14/2018   Left hip pain 01/24/2019   Microhematuria    NASH (nonalcoholic steatohepatitis)    Neuropathy    Non-intractable vomiting    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    Osteoarthritis    Prediabetes    Prolapsed internal hemorrhoids 10/15/2021   Rectal prolapse    S/P admn tPA in diff fac w/n last 24 hr bef adm to crnt fac    S/P hip hemiarthroplasty 04/05/2019   Slow transit constipation    Stage 2 chronic kidney disease    Stasis ulcer of lower extremity, right ([place])    Stroke ([place]) 11/13/2018   Stroke [facility])    Urinary retention    UTI (urinary tract infection)     PAST SURGICAL HISTORY:   Past Surgical History:  Procedure Laterality Date   ABDOMINAL HYSTERECTOMY  2012   AMPUTATION TOE     right 2nd toe    BIOPSY  12/04/2020   Procedure: BIOPSY;  Surgeon: Jackquline Denmark, MD;  Location: [facility];  Service: Endoscopy;;   BREAST SURGERY     Breast tumor removed non-malignant   BUNIONECTOMY  05/01/2016   CHOLECYSTECTOMY  2013  COLONOSCOPY  07/12/2014   Colonic polyps status post polypectomy. Moderate predominanlty sigmoid diverticulosis. High redundant colon.   COLONOSCOPY WITH PROPOFOL N/A 12/04/2020   Procedure: COLONOSCOPY WITH PROPOFOL;  Surgeon: Jackquline Denmark, MD;  Location: [facility];  Service: Endoscopy;  Laterality: N/A;   ESOPHAGEAL BANDING  12/04/2020   Procedure: ESOPHAGEAL BANDING;  Surgeon: Jackquline Denmark, MD;  Location: [facility];  Service: Endoscopy;;   ESOPHAGOGASTRODUODENOSCOPY  11/14/2015   Mild Candida esophagitis. Mild gastritis. Incidental gastic polyps (status post polypectomy x2)   ESOPHAGOGASTRODUODENOSCOPY (EGD) WITH PROPOFOL N/A 12/04/2020   Procedure: ESOPHAGOGASTRODUODENOSCOPY (EGD) WITH PROPOFOL;  Surgeon: Jackquline Denmark, MD;  Location: [facility];  Service: Endoscopy;  Laterality: N/A;   HOT  HEMOSTASIS N/A 12/04/2020   Procedure: HOT HEMOSTASIS (ARGON PLASMA COAGULATION/BICAP);  Surgeon: Jackquline Denmark, MD;  Location: [facility];  Service: Endoscopy;  Laterality: N/A;   LOOP RECORDER INSERTION N/A 11/16/2018   Procedure: LOOP RECORDER INSERTION;  Surgeon: Thompson Grayer, MD;  Location: [facility];  Service: Cardiovascular;  Laterality: N/A;   PARTIAL HIP ARTHROPLASTY Left 12/2018   [facility]   POLYPECTOMY     vaginal lesion removed     Precancerous    CURRENT MEDICATIONS:   Current Outpatient Medications  Medication Sig Dispense Refill   levothyroxine (SYNTHROID) 112 MCG tablet Take 1 tablet by mouth daily.     alendronate (FOSAMAX) 70 MG tablet Take 70 mg by mouth once a week.     arformoterol (BROVANA) 15 MCG/2ML NEBU Take 15 mcg by nebulization 2 (two) times daily.     budesonide (PULMICORT) 0.5 MG/2ML nebulizer solution Take 0.5 mg by nebulization 2 (two) times daily.     escitalopram (LEXAPRO) 10 MG tablet Take 10 mg by mouth daily.     fluticasone (CUTIVATE) 0.05 % cream Apply topically 2 (two) times daily. For 10 days then use as needed. Use rectally please 30 g 2   furosemide (LASIX) 40 MG tablet Take 1 tablet (40 mg total) by mouth daily. 90 tablet 2   lansoprazole (PREVACID) 30 MG capsule Take 1 capsule (30 mg total) by mouth daily at 12 noon. 90 capsule 3   metoprolol tartrate (LOPRESSOR) 25 MG tablet Take 25 mg by mouth daily.     Potassium Chloride ER 20 MEQ TBCR Take 1 tablet by mouth daily.     revefenacin (YUPELRI) 175 MCG/3ML nebulizer solution Take 175 mcg by nebulization daily.     rifaximin (XIFAXAN) 550 MG TABS tablet Take 1 tablet (550 mg total) by mouth 2 (two) times daily. (Patient not taking: Reported on 09/20/2021) 60 tablet 11   spironolactone (ALDACTONE) 25 MG tablet Take 12.5 mg by mouth daily.     No current facility-administered medications for this visit.    ALLERGIES:   Allergies  Allergen Reactions   Penicillins  Anaphylaxis    Did it involve swelling of the face/tongue/throat, SOB, or low BP? yes Did it involve sudden or severe rash/hives, skin peeling, or any reaction on the inside of your mouth or nose? no Did you need to seek medical attention at a hospital or doctor's office? yes When did it last happen?    pt was 22 If all above answers are "NO", may proceed with cephalosporin use.     FAMILY HISTORY:   Family History  Problem Relation Age of Onset   Hypertension Mother    Uterine cancer Mother    Heart attack Father    Hyperlipidemia Father    Hypertension Father  Diabetes Father    Heart disease Father    Breast cancer Half-Sister     SOCIAL HISTORY:  The patient was born in [place].  She currently lives in town.  She is widowed; she was previously married for 43 years.  She has 4 children, 1 of whom is deceased from multiple myeloma.  She also has 2 grandchildren and 1 great grandchild.  She worked at a [employer] for over 15 years.  She has smoked a least a pack of cigarettes daily for 71 years.  She briefly drink alcohol as a teenager.  REVIEW OF SYSTEMS:  Review of Systems  Constitutional:  Positive for fatigue. Negative for fever.  HENT:   Positive for hearing loss. Negative for sore throat.   Eyes:  Negative for eye problems.  Respiratory:  Positive for cough. Negative for chest tightness and hemoptysis.   Cardiovascular:  Negative for chest pain and palpitations.  Gastrointestinal:  Positive for nausea. Negative for abdominal distention, abdominal pain, blood in stool, constipation, diarrhea and vomiting.  Endocrine: Negative for hot flashes.  Genitourinary:  Negative for difficulty urinating, dysuria, frequency, hematuria and nocturia.   Musculoskeletal:  Positive for back pain. Negative for arthralgias, gait problem and myalgias.  Skin: Negative.  Negative for itching and rash.  Neurological: Negative.  Negative for dizziness, extremity weakness, gait  problem, headaches, light-headedness and numbness.  Hematological: Negative.   Psychiatric/Behavioral:  Positive for depression. Negative for suicidal ideas. The patient is nervous/anxious.    PHYSICAL EXAM:  Blood pressure 136/70, pulse 77, temperature 98 F (36.7 C), resp. rate 14, height 5' 2" (1.575 m), weight 135 lb 8 oz (61.5 kg), SpO2 98 %. Wt Readings from Last 3 Encounters:  03/24/22 135 lb 8 oz (61.5 kg)  09/20/21 136 lb 2 oz (61.7 kg)  05/29/21 132 lb 8 oz (60.1 kg)   Body mass index is 24.78 kg/m. Performance status (ECOG): 1 - Symptomatic but completely ambulatory Physical Exam Constitutional:      Appearance: Normal appearance. She is not ill-appearing.  HENT:     Mouth/Throat:     Mouth: Mucous membranes are moist.     Pharynx: Oropharynx is clear. No oropharyngeal exudate or posterior oropharyngeal erythema.  Cardiovascular:     Rate and Rhythm: Normal rate and regular rhythm.     Heart sounds: No murmur heard.    No friction rub. No gallop.  Pulmonary:     Effort: Pulmonary effort is normal. No respiratory distress.     Breath sounds: Normal breath sounds. No wheezing, rhonchi or rales.  Abdominal:     General: Bowel sounds are normal. There is no distension.     Palpations: Abdomen is soft. There is no mass.     Tenderness: There is no abdominal tenderness.  Musculoskeletal:        General: No swelling.     Right lower leg: No edema.     Left lower leg: No edema.  Lymphadenopathy:     Cervical: No cervical adenopathy.     Upper Body:     Right upper body: No supraclavicular or axillary adenopathy.     Left upper body: No supraclavicular or axillary adenopathy.     Lower Body: No right inguinal adenopathy. No left inguinal adenopathy.  Skin:    General: Skin is warm.     Coloration: Skin is not jaundiced.     Findings: No lesion or rash.  Neurological:     General: No focal deficit present.  Mental Status: She is alert and oriented to person,  place, and time. Mental status is at baseline.  Psychiatric:        Mood and Affect: Mood normal.        Behavior: Behavior normal.        Thought Content: Thought content normal.    LABS:      Latest Ref Rng & Units 03/24/2022   12:00 AM 09/20/2021    3:02 PM 05/29/2021   10:37 AM  CBC  WBC  4.9     4.5  5.0   Hemoglobin 12.0 - 16.0 8.9     10.9  9.8   Hematocrit 36 - 46 28     33.7  31.5   Platelets 150 - 400 K/uL 207     153  201.0      This result is from an external source.      Latest Ref Rng & Units 03/24/2022   12:00 AM 09/20/2021    3:02 PM 05/29/2021   10:37 AM  CMP  Glucose 70 - 99 mg/dL  135  74   BUN 4 - _0 Creatinine 0.5 - 1.1 0.9     0.94  0.82   Sodium 137 - 147 139     143  138   Potassium 3.5 - 5.1 mEq/L 3.9     4.2  4.3   Chloride 99 - 108 111     106  107   CO2 13 - _1 Calcium 8.7 - 10.7 9.2     8.8  9.0   Total Protein 6.0 - 8.5 g/dL  6.6  6.9   Total Bilirubin 0.0 - 1.2 mg/dL  0.4  0.6   Alkaline Phos 25 - 125 116     169  133   AST 13 - 35 52     50  36   ALT 7 - 35 U/L 37     29  26      This result is from an external source.   ASSESSMENT & PLAN:  An 86 y.o. female who I was asked to consult upon for anemia.  In clinic today, I will check her iron, vitamin B12, and folate levels to ensure there are no nutritional deficiencies factoring into her anemia.  Based upon her comprehensive metabolic panel today, there is no evidence of renal insufficiency factoring into her anemia.  I will check a TSH level to ensure severe thyroid disease is not factoring into her anemia.  A serum protein electrophoresis will also be checked to ensure an underlying plasma cell dyscrasia, such as multiple myeloma, is not factoring into her anemia.  When reviewing her medication list, she is not on any medicines which are known to cause anemia via bone marrow suppression.  I will see this patient back in 1 week to go over all of her labs collected  today, which will be used to formulate her next course of action as it pertains to her anemia management.  The patient understands all the plans discussed today and is in agreement with them.  I do appreciate Marco Collie, MD for his new consult.    Macarthur Critchley, MD

## 2022-03-24 ENCOUNTER — Inpatient Hospital Stay: Payer: Medicare Other | Attending: Oncology | Admitting: Oncology

## 2022-03-24 ENCOUNTER — Inpatient Hospital Stay: Payer: Medicare Other

## 2022-03-24 ENCOUNTER — Encounter: Payer: Self-pay | Admitting: Oncology

## 2022-03-24 ENCOUNTER — Telehealth: Payer: Self-pay | Admitting: Oncology

## 2022-03-24 VITALS — BP 136/70 | HR 77 | Temp 98.0°F | Resp 14 | Ht 62.0 in | Wt 135.5 lb

## 2022-03-24 DIAGNOSIS — Z807 Family history of other malignant neoplasms of lymphoid, hematopoietic and related tissues: Secondary | ICD-10-CM | POA: Insufficient documentation

## 2022-03-24 DIAGNOSIS — D649 Anemia, unspecified: Secondary | ICD-10-CM

## 2022-03-24 DIAGNOSIS — D638 Anemia in other chronic diseases classified elsewhere: Secondary | ICD-10-CM

## 2022-03-24 DIAGNOSIS — Z79899 Other long term (current) drug therapy: Secondary | ICD-10-CM | POA: Diagnosis not present

## 2022-03-24 DIAGNOSIS — Z8673 Personal history of transient ischemic attack (TIA), and cerebral infarction without residual deficits: Secondary | ICD-10-CM | POA: Insufficient documentation

## 2022-03-24 DIAGNOSIS — Z803 Family history of malignant neoplasm of breast: Secondary | ICD-10-CM | POA: Insufficient documentation

## 2022-03-24 DIAGNOSIS — Z808 Family history of malignant neoplasm of other organs or systems: Secondary | ICD-10-CM | POA: Insufficient documentation

## 2022-03-24 DIAGNOSIS — D472 Monoclonal gammopathy: Secondary | ICD-10-CM | POA: Diagnosis not present

## 2022-03-24 DIAGNOSIS — I10 Essential (primary) hypertension: Secondary | ICD-10-CM | POA: Insufficient documentation

## 2022-03-24 DIAGNOSIS — D509 Iron deficiency anemia, unspecified: Secondary | ICD-10-CM | POA: Diagnosis not present

## 2022-03-24 DIAGNOSIS — R011 Cardiac murmur, unspecified: Secondary | ICD-10-CM | POA: Diagnosis not present

## 2022-03-24 LAB — BASIC METABOLIC PANEL
BUN: 18 (ref 4–21)
CO2: 22 (ref 13–22)
Chloride: 111 — AB (ref 99–108)
Creatinine: 0.9 (ref 0.5–1.1)
Glucose: 83
Potassium: 3.9 mEq/L (ref 3.5–5.1)
Sodium: 139 (ref 137–147)

## 2022-03-24 LAB — IRON AND TIBC
Iron: 29 ug/dL (ref 28–170)
Saturation Ratios: 7 % — ABNORMAL LOW (ref 10.4–31.8)
TIBC: 409 ug/dL (ref 250–450)
UIBC: 380 ug/dL

## 2022-03-24 LAB — FERRITIN: Ferritin: 37 ng/mL (ref 11–307)

## 2022-03-24 LAB — CBC AND DIFFERENTIAL
HCT: 28 — AB (ref 36–46)
Hemoglobin: 8.9 — AB (ref 12.0–16.0)
Neutrophils Absolute: 3.19
Platelets: 207 10*3/uL (ref 150–400)
WBC: 4.9

## 2022-03-24 LAB — HEPATIC FUNCTION PANEL
ALT: 37 U/L — AB (ref 7–35)
AST: 52 — AB (ref 13–35)
Alkaline Phosphatase: 116 (ref 25–125)
Bilirubin, Total: 0.5

## 2022-03-24 LAB — TSH: TSH: 1.563 u[IU]/mL (ref 0.350–4.500)

## 2022-03-24 LAB — CBC: RBC: 3.41 — AB (ref 3.87–5.11)

## 2022-03-24 LAB — FOLATE: Folate: 7.9 ng/mL (ref >5.9)

## 2022-03-24 LAB — COMPREHENSIVE METABOLIC PANEL
Albumin: 3.4 — AB (ref 3.5–5.0)
Calcium: 9.2 (ref 8.7–10.7)

## 2022-03-24 LAB — VITAMIN B12: Vitamin B-12: 328 pg/mL (ref 180–914)

## 2022-03-24 NOTE — Telephone Encounter (Signed)
Patient has been scheduled for follow-up visit per 03/24/22 los. Pt given an appt calendar with date and time.

## 2022-03-26 LAB — PROTEIN ELECTROPHORESIS, SERUM
A/G Ratio: 0.8 (ref 0.7–1.7)
Albumin ELP: 3.1 g/dL (ref 2.9–4.4)
Alpha-1-Globulin: 0.3 g/dL (ref 0.0–0.4)
Alpha-2-Globulin: 0.7 g/dL (ref 0.4–1.0)
Beta Globulin: 0.8 g/dL (ref 0.7–1.3)
Gamma Globulin: 2.1 g/dL — ABNORMAL HIGH (ref 0.4–1.8)
Globulin, Total: 3.8 g/dL (ref 2.2–3.9)
M-Spike, %: 1.6 g/dL — ABNORMAL HIGH
Total Protein ELP: 6.9 g/dL (ref 6.0–8.5)

## 2022-03-27 NOTE — Progress Notes (Signed)
Carelink Summary Report / Loop Recorder 

## 2022-03-31 ENCOUNTER — Ambulatory Visit (INDEPENDENT_AMBULATORY_CARE_PROVIDER_SITE_OTHER): Payer: Medicare Other | Admitting: Cardiology

## 2022-03-31 ENCOUNTER — Other Ambulatory Visit: Payer: Self-pay | Admitting: Oncology

## 2022-03-31 ENCOUNTER — Encounter: Payer: Self-pay | Admitting: Cardiology

## 2022-03-31 ENCOUNTER — Inpatient Hospital Stay: Payer: Medicare Other

## 2022-03-31 ENCOUNTER — Inpatient Hospital Stay (INDEPENDENT_AMBULATORY_CARE_PROVIDER_SITE_OTHER): Payer: Medicare Other | Admitting: Oncology

## 2022-03-31 VITALS — BP 124/62 | HR 90 | Ht 62.0 in | Wt 138.6 lb

## 2022-03-31 VITALS — BP 155/70 | HR 91 | Temp 98.5°F | Resp 14 | Ht 62.0 in | Wt 138.7 lb

## 2022-03-31 DIAGNOSIS — Z8673 Personal history of transient ischemic attack (TIA), and cerebral infarction without residual deficits: Secondary | ICD-10-CM | POA: Diagnosis not present

## 2022-03-31 DIAGNOSIS — D472 Monoclonal gammopathy: Secondary | ICD-10-CM | POA: Insufficient documentation

## 2022-03-31 DIAGNOSIS — C9000 Multiple myeloma not having achieved remission: Secondary | ICD-10-CM | POA: Insufficient documentation

## 2022-03-31 DIAGNOSIS — Z803 Family history of malignant neoplasm of breast: Secondary | ICD-10-CM | POA: Diagnosis not present

## 2022-03-31 DIAGNOSIS — D649 Anemia, unspecified: Secondary | ICD-10-CM

## 2022-03-31 DIAGNOSIS — Z79899 Other long term (current) drug therapy: Secondary | ICD-10-CM | POA: Diagnosis not present

## 2022-03-31 DIAGNOSIS — Z808 Family history of malignant neoplasm of other organs or systems: Secondary | ICD-10-CM | POA: Diagnosis not present

## 2022-03-31 DIAGNOSIS — R011 Cardiac murmur, unspecified: Secondary | ICD-10-CM | POA: Insufficient documentation

## 2022-03-31 DIAGNOSIS — Z807 Family history of other malignant neoplasms of lymphoid, hematopoietic and related tissues: Secondary | ICD-10-CM | POA: Diagnosis not present

## 2022-03-31 DIAGNOSIS — I10 Essential (primary) hypertension: Secondary | ICD-10-CM

## 2022-03-31 NOTE — Patient Instructions (Signed)
Medication Instructions:  Your physician recommends that you continue on your current medications as directed. Please refer to the Current Medication list given to you today.  *If you need a refill on your cardiac medications before your next appointment, please call your pharmacy*   Lab Work: None ordered If you have labs (blood work) drawn today and your tests are completely normal, you will receive your results only by: [place] (if you have MyChart) OR A paper copy in the mail If you have any lab test that is abnormal or we need to change your treatment, we will call you to review the results.   Testing/Procedures: Your physician has requested that you have an echocardiogram. Echocardiography is a painless test that uses sound waves to create images of your heart. It provides your doctor with information about the size and shape of your heart and how well your heart's chambers and valves are working. This procedure takes approximately one hour. There are no restrictions for this procedure.    Follow-Up: At [facility], you and your health needs are our priority.  As part of our continuing mission to provide you with exceptional heart care, we have created designated Provider Care Teams.  These Care Teams include your primary Cardiologist (physician) and Advanced Practice Providers (APPs -  Physician Assistants and Nurse Practitioners) who all work together to provide you with the care you need, when you need it.  We recommend signing up for the patient portal called "MyChart".  Sign up information is provided on this After Visit Summary.  MyChart is used to connect with patients for Virtual Visits (Telemedicine).  Patients are able to view lab/test results, encounter notes, upcoming appointments, etc.  Non-urgent messages can be sent to your provider as well.   To learn more about what you can do with MyChart, go to [email or web address].    Your next appointment:   12  month(s)  The format for your next appointment:   In Person  Provider:   Jyl Heinz, MD   Other Instructions Echocardiogram An echocardiogram is a test that uses sound waves (ultrasound) to produce images of the heart. Images from an echocardiogram can provide important information about: Heart size and shape. The size and thickness and movement of your heart's walls. Heart muscle function and strength. Heart valve function or if you have stenosis. Stenosis is when the heart valves are too narrow. If blood is flowing backward through the heart valves (regurgitation). A tumor or infectious growth around the heart valves. Areas of heart muscle that are not working well because of poor blood flow or injury from a heart attack. Aneurysm detection. An aneurysm is a weak or damaged part of an artery wall. The wall bulges out from the normal force of blood pumping through the body. Tell a health care provider about: Any allergies you have. All medicines you are taking, including vitamins, herbs, eye drops, creams, and over-the-counter medicines. Any blood disorders you have. Any surgeries you have had. Any medical conditions you have. Whether you are pregnant or may be pregnant. What are the risks? Generally, this is a safe test. However, problems may occur, including an allergic reaction to dye (contrast) that may be used during the test. What happens before the test? No specific preparation is needed. You may eat and drink normally. What happens during the test? You will take off your clothes from the waist up and put on a hospital gown. Electrodes or electrocardiogram (ECG)patches may be placed on  your chest. The electrodes or patches are then connected to a device that monitors your heart rate and rhythm. You will lie down on a table for an ultrasound exam. A gel will be applied to your chest to help sound waves pass through your skin. A handheld device, called a transducer, will  be pressed against your chest and moved over your heart. The transducer produces sound waves that travel to your heart and bounce back (or "echo" back) to the transducer. These sound waves will be captured in real-time and changed into images of your heart that can be viewed on a video monitor. The images will be recorded on a computer and reviewed by your health care provider. You may be asked to change positions or hold your breath for a short time. This makes it easier to get different views or better views of your heart. In some cases, you may receive contrast through an IV in one of your veins. This can improve the quality of the pictures from your heart. The procedure may vary among health care providers and hospitals.   What can I expect after the test? You may return to your normal, everyday life, including diet, activities, and medicines, unless your health care provider tells you not to do that. Follow these instructions at home: It is up to you to get the results of your test. Ask your health care provider, or the department that is doing the test, when your results will be ready. Keep all follow-up visits. This is important. Summary An echocardiogram is a test that uses sound waves (ultrasound) to produce images of the heart. Images from an echocardiogram can provide important information about the size and shape of your heart, heart muscle function, heart valve function, and other possible heart problems. You do not need to do anything to prepare before this test. You may eat and drink normally. After the echocardiogram is completed, you may return to your normal, everyday life, unless your health care provider tells you not to do that. This information is not intended to replace advice given to you by your health care provider. Make sure you discuss any questions you have with your health care provider. Document Revised: 01/31/2020 Document Reviewed: 01/31/2020 Elsevier Patient  Education  2021 [employer].

## 2022-03-31 NOTE — Progress Notes (Addendum)
[facility]  [address] [place],  [place]  [address] [phone]  Clinic Day:  03/31/2022  Referring physician: Marco Collie, MD  HISTORY OF PRESENT ILLNESS:  The patient is a 86 y.o. female  who I recently again seen for anemia.  She comes in today to go over all of her labs to determine the etiology behind this.  Since her last visit, the patient has been doing well.  She does have intermittent weakness, but denies having any overt forms of blood loss.  Of note, since her last visit, more information was discovered about her anemia.  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  Internal hemorrhoids have also been seen.  Previous scans have also shown secondary splenomegaly.  In the past, she has needed IV iron to replenish her iron stores and improve her hemoglobin.  Although the patient recalls being told she has cirrhosis, she does not recall being told how her cirrhotic liver could ultimately be factoring into the development of her anemia over time.   PHYSICAL EXAM:  Blood pressure (!) 155/70, pulse 91, temperature 98.5 F (36.9 C), resp. rate 14, height 5' 2"  (1.575 m), weight 138 lb 11.2 oz (62.9 kg), SpO2 93 %. Wt Readings from Last 3 Encounters:  03/31/22 138 lb 9.6 oz (62.9 kg)  03/31/22 138 lb 11.2 oz (62.9 kg)  03/24/22 135 lb 8 oz (61.5 kg)   Body mass index is 25.37 kg/m. Performance status (ECOG): 1 - Symptomatic but completely ambulatory Physical Exam Constitutional:      Appearance: Normal appearance. She is not ill-appearing.     Comments: She appears mildly weak  HENT:     Mouth/Throat:     Mouth: Mucous membranes are moist.     Pharynx: Oropharynx is clear. No oropharyngeal exudate or posterior oropharyngeal erythema.  Cardiovascular:     Rate and Rhythm: Normal rate and regular rhythm.     Heart  sounds: No murmur heard.    No friction rub. No gallop.  Pulmonary:     Effort: Pulmonary effort is normal. No respiratory distress.     Breath sounds: Normal breath sounds. No wheezing, rhonchi or rales.  Abdominal:     General: Bowel sounds are normal. There is no distension.     Palpations: Abdomen is soft. There is no mass.     Tenderness: There is no abdominal tenderness.  Musculoskeletal:        General: No swelling.     Right lower leg: No edema.     Left lower leg: No edema.  Lymphadenopathy:     Cervical: No cervical adenopathy.     Upper Body:     Right upper body: No supraclavicular or axillary adenopathy.     Left upper body: No supraclavicular or axillary adenopathy.     Lower Body: No right inguinal adenopathy. No left inguinal adenopathy.  Skin:    General: Skin is warm.     Coloration: Skin is not jaundiced.     Findings: No lesion or rash.  Neurological:     General: No focal deficit present.     Mental Status: She is alert and oriented to person, place, and time. Mental status is at baseline.  Psychiatric:        Mood and Affect: Mood normal.        Behavior: Behavior normal.  Thought Content: Thought content normal.    LABS:       Latest Ref Rng & Units 03/24/2022   12:00 AM 09/20/2021    3:02 PM 05/29/2021   10:37 AM  CBC  WBC  4.9     4.5  5.0   Hemoglobin 12.0 - 16.0 8.9     10.9  9.8   Hematocrit 36 - 46 28     33.7  31.5   Platelets 150 - 400 K/uL 207     153  201.0      This result is from an external source.      Latest Ref Rng & Units 03/24/2022   12:00 AM 09/20/2021    3:02 PM 05/29/2021   10:37 AM  CMP  Glucose 70 - 99 mg/dL  135  74   BUN 4 - 21 18     16  15    Creatinine 0.5 - 1.1 0.9     0.94  0.82   Sodium 137 - 147 139     143  138   Potassium 3.5 - 5.1 mEq/L 3.9     4.2  4.3   Chloride 99 - 108 111     106  107   CO2 13 - 22 22     19  26    Calcium 8.7 - 10.7 9.2     8.8  9.0   Total Protein 6.0 - 8.5 g/dL  6.6  6.9    Total Bilirubin 0.0 - 1.2 mg/dL  0.4  0.6   Alkaline Phos 25 - 125 116     169  133   AST 13 - 35 52     50  36   ALT 7 - 35 U/L 37     29  26      This result is from an external source.    Latest Reference Range & Units 03/24/22 13:33  Iron 28 - 170 ug/dL 29  UIBC ug/dL 380  TIBC 250 - 450 ug/dL 409  Saturation Ratios 10.4 - 31.8 % 7 (L)  Ferritin 11 - 307 ng/mL 37  Folate >5.9 ng/mL 7.9  Vitamin B12 180 - 914 pg/mL 328  (L): Data is abnormally low  Latest Reference Range & Units 03/24/22 13:33  Total Protein ELP 6.0 - 8.5 g/dL 6.9  Albumin ELP 2.9 - 4.4 g/dL 3.1  Globulin, Total 2.2 - 3.9 g/dL 3.8 (C)  A/G Ratio 0.7 - 1.7  0.8 (C)  Alpha-1-Globulin 0.0 - 0.4 g/dL 0.3  Alpha-2-Globulin 0.4 - 1.0 g/dL 0.7  Beta Globulin 0.7 - 1.3 g/dL 0.8  Gamma Globulin 0.4 - 1.8 g/dL 2.1 (H)  M-SPIKE, % Not Observed g/dL 1.6 (H)  (H): Data is abnormally high (C): Corrected  ASSESSMENT & PLAN:  An 86 y.o. female who I recently began seeing for anemia.  In clinic today, I went over all of her recent labs with her, which show that she has borderline low iron levels.  A soluble transferrin level will be checked today to further confirm if iron deficiency may be present.  If elevated, I would not have a problem giving her a course of IV iron to improve her anemia.  More concerning is the fact that her recent SPEP showed the presence of a fairly significant M-spike, which raises the suspicion for a plasma cell dyscrasia, such as multiple myeloma, being present.  I will check her quantitative immunoglobulins and serum light chain levels today.  If  abnormal, it would further raise the suspicion for multiple myeloma which would ultimately require a bone marrow biopsy to definitively prove.  I will see her back in 2 weeks to go over all of her labs collected today and their implications.  Of note, her complicated anemia picture was explained in detail with both the patient and her daughter, who listened  intently on the patient's speaker phone.  The patient was also given all of her recent labs, with a detailed written explanation as to why the reason behind her anemia is likely multifactorial.  The patient understands all the plans discussed today and is in agreement with them.  Tahra Hitzeman Macarthur Critchley, MD

## 2022-03-31 NOTE — Progress Notes (Signed)
Cardiology Office Note:    Date:  03/31/2022   ID:  Stephanie Love, DOB 08/05/1935, MRN [id number]  PCP:  Marga Hoots, NP  Cardiologist:  Jenean Lindau, MD   Referring MD: Carvel Getting Key, *    ASSESSMENT:    1. Essential hypertension   2. History of stroke   3. Cardiac murmur    PLAN:    In order of problems listed above:  Primary prevention stressed with the patient.  Importance of compliance with diet and medication stressed and she vocalized understanding. Essential hypertension: Blood pressure is stable and diet was emphasized. History of multiple stroke: Managed by primary care.  She was told that these were ischemic strokes.  Her monitoring by what appears to be Linq device is unremarkable. Cardiac murmur: Echocardiogram will be done to assess murmur heard on auscultation. Patient will be seen in follow-up appointment in 12 months or earlier if the patient has any concerns    Medication Adjustments/Labs and Tests Ordered: Current medicines are reviewed at length with the patient today.  Concerns regarding medicines are outlined above.  No orders of the defined types were placed in this encounter.  No orders of the defined types were placed in this encounter.    History of Present Illness:    Stephanie Love is a 86 y.o. female who is being seen today for the evaluation of cardiac murmur at the request of Carvel Getting Key, *.  Patient is a pleasant 86 year old female.  She has past medical history of essential hypertension, cirrhosis and history of recent stroke with possible bleeding in the brain.  She is a good historian.  She has history of anemia.  She denies any chest pain orthopnea or PND.  She mentions to me that she was told to be established with [place].  She has undergone Linq device monitoring for her strokes and she has been told that those evaluations have been unremarkable.  I reviewed the last couple of reports and there is no issue of  any arrhythmia.  At the time of my evaluation, the patient is alert awake oriented and in no distress.  Past Medical History:  Diagnosis Date   Abnormality of gait as late effect of cerebrovascular accident (CVA) 12/13/2018   Aftercare following surgery 05/06/2016   Anemia    Anemia of chronic disease    Arthritis    Ataxia    Autoimmune hepatitis ([place])    Benign essential HTN    Chronic obstructive pulmonary disease (HCC)    Cirrhosis (HCC)    Cirrhosis (HCC)    Congestive heart failure (HCC)    COPD exacerbation (HCC)    CVA (cerebral vascular accident) ([place])    Depression    Diverticulosis    Dyslipidemia    Embolic stroke ([place]) 09/29/8117   Esophageal varices without bleeding (HCC)    Essential hypertension    Gastric AVM    GERD (gastroesophageal reflux disease)    Hammer toe of right foot 09/20/2015   Heme positive stool    Hepatic cirrhosis ([place])    Hepatitis A virus infection 1963   Hiatal hernia    History of colon polyps    History of colon polyps    Hyperlipidemia    Hypertension    Hypoalbuminemia due to protein-calorie malnutrition (HCC)    Hypothyroidism    Iron deficiency anemia    Ischemic stroke ([place]) 11/14/2018   Left hip pain 01/24/2019   Microhematuria    NASH (  nonalcoholic steatohepatitis)    Neuropathy    Non-intractable vomiting    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    Osteoarthritis    Prediabetes    Prolapsed internal hemorrhoids 10/15/2021   Rectal prolapse    S/P admn tPA in diff fac w/n last 24 hr bef adm to crnt fac    S/P hip hemiarthroplasty 04/05/2019   Slow transit constipation    Stage 2 chronic kidney disease    Stasis ulcer of lower extremity, right ([place])    Stroke ([place]) 11/13/2018   Stroke [facility])    Urinary retention    UTI (urinary tract infection)     Past Surgical History:  Procedure Laterality Date   ABDOMINAL HYSTERECTOMY  2012   AMPUTATION TOE     right 2nd toe    BIOPSY  12/04/2020    Procedure: BIOPSY;  Surgeon: Jackquline Denmark, MD;  Location: [facility] END[facility]COPY;  Service: Endoscopy;;   BREAST SURGERY     Breast tumor removed non-malignant   BUNIONECTOMY  05/01/2016   CHOLECYSTECTOMY  2013   COLONOSCOPY  07/12/2014   Colonic polyps status post polypectomy. Moderate predominanlty sigmoid diverticulosis. High redundant colon.   COLONOSCOPY WITH PROPOFOL N/A 12/04/2020   Procedure: COLONOSCOPY WITH PROPOFOL;  Surgeon: Jackquline Denmark, MD;  Location: [facility];  Service: Endoscopy;  Laterality: N/A;   ESOPHAGEAL BANDING  12/04/2020   Procedure: ESOPHAGEAL BANDING;  Surgeon: Jackquline Denmark, MD;  Location: [facility];  Service: Endoscopy;;   ESOPHAGOGASTRODUODENOSCOPY  11/14/2015   Mild Candida esophagitis. Mild gastritis. Incidental gastic polyps (status post polypectomy x2)   ESOPHAGOGASTRODUODENOSCOPY (EGD) WITH PROPOFOL N/A 12/04/2020   Procedure: ESOPHAGOGASTRODUODENOSCOPY (EGD) WITH PROPOFOL;  Surgeon: Jackquline Denmark, MD;  Location: [facility];  Service: Endoscopy;  Laterality: N/A;   HOT HEMOSTASIS N/A 12/04/2020   Procedure: HOT HEMOSTASIS (ARGON PLASMA COAGULATION/BICAP);  Surgeon: Jackquline Denmark, MD;  Location: [facility];  Service: Endoscopy;  Laterality: N/A;   LOOP RECORDER INSERTION N/A 11/16/2018   Procedure: LOOP RECORDER INSERTION;  Surgeon: Thompson Grayer, MD;  Location: [facility];  Service: Cardiovascular;  Laterality: N/A;   PARTIAL HIP ARTHROPLASTY Left 12/2018   [facility]   POLYPECTOMY     vaginal lesion removed     Precancerous    Current Medications: Current Meds  Medication Sig   alendronate (FOSAMAX) 70 MG tablet Take 70 mg by mouth once a week.   arformoterol (BROVANA) 15 MCG/2ML NEBU Take 15 mcg by nebulization 2 (two) times daily.   budesonide (PULMICORT) 0.5 MG/2ML nebulizer solution Take 0.5 mg by nebulization 2 (two) times daily.   escitalopram (LEXAPRO) 10 MG tablet Take 10 mg by mouth daily.   fluticasone (CUTIVATE) 0.05 %  cream Apply topically 2 (two) times daily. For 10 days then use as needed. Use rectally please   furosemide (LASIX) 40 MG tablet Take 1 tablet (40 mg total) by mouth daily.   lansoprazole (PREVACID) 30 MG capsule Take 1 capsule (30 mg total) by mouth daily at 12 noon.   levothyroxine (SYNTHROID) 112 MCG tablet Take 1 tablet by mouth daily.   metoprolol tartrate (LOPRESSOR) 25 MG tablet Take 25 mg by mouth daily.   Potassium Chloride ER 20 MEQ TBCR Take 1 tablet by mouth daily.   revefenacin (YUPELRI) 175 MCG/3ML nebulizer solution Take 175 mcg by nebulization daily.   rifaximin (XIFAXAN) 550 MG TABS tablet Take 1 tablet (550 mg total) by mouth 2 (two) times daily.   spironolactone (ALDACTONE) 25 MG  tablet Take 12.5 mg by mouth daily.     Allergies:   Penicillins   Social History   Socioeconomic History   Marital status: Widowed    Spouse name: Not on file   Number of children: 4   Years of education: GED   Highest education level: Not on file  Occupational History   Occupation: RETIRED FROM [employer]  Tobacco Use   Smoking status: Former    Types: Cigarettes    Quit date: 11/16/1981    Years since quitting: 40.3   Smokeless tobacco: Never  Vaping Use   Vaping Use: Never used  Substance and Sexual Activity   Alcohol use: Not Currently   Drug use: Never   Sexual activity: Not Currently  Other Topics Concern   Not on file  Social History Narrative   Not on file   Social Determinants of Health   Financial Resource Strain: Low Risk  (11/15/2018)   Overall Financial Resource Strain (CARDIA)    Difficulty of Paying Living Expenses: Not very hard  Food Insecurity: No Food Insecurity (11/15/2018)   Hunger Vital Sign    Worried About Running Out of Food in the Last Year: Never true    Ran Out of Food in the Last Year: Never true  Transportation Needs: No Transportation Needs (11/15/2018)   PRAPARE - Hydrologist (Medical): No    Lack of  Transportation (Non-Medical): No  Physical Activity: Inactive (11/15/2018)   Exercise Vital Sign    Days of Exercise per Week: 0 days    Minutes of Exercise per Session: 0 min  Stress: No Stress Concern Present (11/15/2018)   [place]    Feeling of Stress : Not at all  Social Connections: Moderately Integrated (11/15/2018)   Social Connection and Isolation Panel [NHANES]    Frequency of Communication with Friends and Family: Twice a week    Frequency of Social Gatherings with Friends and Family: Twice a week    Attends Religious Services: 1 to 4 times per year    Active Member of [employer] or Organizations: Yes    Attends Archivist Meetings: 1 to 4 times per year    Marital Status: Widowed     Family History: The patient's family history includes Breast cancer in her half-sister; Diabetes in her father; Heart attack in her father; Heart disease in her father; Hyperlipidemia in her father; Hypertension in her father and mother; Uterine cancer in her mother.  ROS:   Please see the history of present illness.    All other systems reviewed and are negative.  EKGs/Labs/Other Studies Reviewed:    The following studies were reviewed today: EKG was sinus rhythm and nonspecific ST-T changes   Recent Labs: 05/29/2021: Magnesium 1.8 03/24/2022: ALT 37; BUN 18; Creatinine 0.9; Hemoglobin 8.9; Platelets 207; Potassium 3.9; Sodium 139; TSH 1.563  Recent Lipid Panel    Component Value Date/Time   CHOL 124 11/14/2018 0434   TRIG 40 11/14/2018 0434   HDL 51 11/14/2018 0434   CHOLHDL 2.4 11/14/2018 0434   VLDL 8 11/14/2018 0434   LDLCALC 65 11/14/2018 0434    Physical Exam:    VS:  BP 124/62   Pulse 90   Ht 5' 2"  (1.575 m)   Wt 138 lb 9.6 oz (62.9 kg)   SpO2 95%   BMI 25.35 kg/m     Wt Readings from Last 3 Encounters:  03/31/22 138  lb 9.6 oz (62.9 kg)  03/31/22 138 lb 11.2 oz (62.9 kg)  03/24/22 135 lb 8 oz  (61.5 kg)     GEN: Patient is in no acute distress HEENT: Normal NECK: No JVD; No carotid bruits LYMPHATICS: No lymphadenopathy CARDIAC: S1 S2 regular, 2/6 systolic murmur at the apex. RESPIRATORY:  Clear to auscultation without rales, wheezing or rhonchi  ABDOMEN: Soft, non-tender, non-distended MUSCULOSKELETAL:  No edema; No deformity  SKIN: Warm and dry NEUROLOGIC:  Alert and oriented x 3 PSYCHIATRIC:  Normal affect    Signed, Jenean Lindau, MD  03/31/2022 4:30 PM    [place]

## 2022-04-01 DIAGNOSIS — S81801A Unspecified open wound, right lower leg, initial encounter: Secondary | ICD-10-CM | POA: Diagnosis not present

## 2022-04-01 LAB — SOLUBLE TRANSFERRIN RECEPTOR: Transferrin Receptor: 71.9 nmol/L — ABNORMAL HIGH (ref 12.2–27.3)

## 2022-04-01 LAB — KAPPA/LAMBDA LIGHT CHAINS
Kappa free light chain: 26.1 mg/L — ABNORMAL HIGH (ref 3.3–19.4)
Kappa, lambda light chain ratio: 0.07 — ABNORMAL LOW (ref 0.26–1.65)
Lambda free light chains: 373.1 mg/L — ABNORMAL HIGH (ref 5.7–26.3)

## 2022-04-04 LAB — MULTIPLE MYELOMA PANEL, SERUM
Albumin SerPl Elph-Mcnc: 3 g/dL (ref 2.9–4.4)
Albumin/Glob SerPl: 0.9 (ref 0.7–1.7)
Alpha 1: 0.3 g/dL (ref 0.0–0.4)
Alpha2 Glob SerPl Elph-Mcnc: 0.6 g/dL (ref 0.4–1.0)
B-Globulin SerPl Elph-Mcnc: 0.8 g/dL (ref 0.7–1.3)
Gamma Glob SerPl Elph-Mcnc: 2 g/dL — ABNORMAL HIGH (ref 0.4–1.8)
Globulin, Total: 3.7 g/dL (ref 2.2–3.9)
IgA: 54 mg/dL — ABNORMAL LOW (ref 64–422)
IgG (Immunoglobin G), Serum: 2265 mg/dL — ABNORMAL HIGH (ref 586–1602)
IgM (Immunoglobulin M), Srm: 81 mg/dL (ref 26–217)
M Protein SerPl Elph-Mcnc: 1.6 g/dL — ABNORMAL HIGH
Total Protein ELP: 6.7 g/dL (ref 6.0–8.5)

## 2022-04-09 ENCOUNTER — Ambulatory Visit: Payer: Medicare Other | Attending: Cardiology

## 2022-04-09 DIAGNOSIS — Z23 Encounter for immunization: Secondary | ICD-10-CM | POA: Diagnosis not present

## 2022-04-09 DIAGNOSIS — Z8673 Personal history of transient ischemic attack (TIA), and cerebral infarction without residual deficits: Secondary | ICD-10-CM

## 2022-04-09 DIAGNOSIS — R011 Cardiac murmur, unspecified: Secondary | ICD-10-CM | POA: Insufficient documentation

## 2022-04-09 LAB — ECHOCARDIOGRAM COMPLETE
Area-P 1/2: 3.72 cm2
MV M vel: 4.89 m/s
MV Peak grad: 95.5 mmHg
S' Lateral: 3 cm

## 2022-04-13 NOTE — Progress Notes (Unsigned)
[facility]  [address] [place],  [place]  [address] [phone]  Clinic Day:  04/14/2022  Referring physician: Carvel Getting Key, *  HISTORY OF PRESENT ILLNESS:  The patient is a 86 y.o. female  who I recently again seen for anemia.  In addition to labs showing chronic suggestive of iron deficiency anemia, the patient also had a positive SPEP.  This led to her receiving additional monoclonal studies to determine, the possibility is for her potentially having multiple myeloma.  He comes in today to go over these results and the implications.  Since her last visit, the patient has been doing fairly well.  She denies having any overt forms of blood loss.  She also denies having increased fatigue or any bone pain which concerns her for multiple myeloma overt repeat present.  PHYSICAL EXAM:  Blood pressure 135/65, pulse 76, temperature 98.1 F (36.7 C), resp. rate 14, height 5' 2"  (1.575 m), weight 136 lb 11.2 oz (62 kg), SpO2 95 %. Wt Readings from Last 3 Encounters:  04/14/22 136 lb 11.2 oz (62 kg)  03/31/22 138 lb 9.6 oz (62.9 kg)  03/31/22 138 lb 11.2 oz (62.9 kg)   Body mass index is 25 kg/m. Performance status (ECOG): 1 - Symptomatic but completely ambulatory Physical Exam Constitutional:      Appearance: Normal appearance. She is not ill-appearing.     Comments: She appears mildly weak  HENT:     Mouth/Throat:     Mouth: Mucous membranes are moist.     Pharynx: Oropharynx is clear. No oropharyngeal exudate or posterior oropharyngeal erythema.  Cardiovascular:     Rate and Rhythm: Normal rate and regular rhythm.     Heart sounds: No murmur heard.    No friction rub. No gallop.  Pulmonary:     Effort: Pulmonary effort is normal. No respiratory distress.     Breath sounds: Normal breath sounds. No wheezing, rhonchi or rales.  Abdominal:     General: Bowel sounds are normal. There is no distension.     Palpations: Abdomen is soft.  There is no mass.     Tenderness: There is no abdominal tenderness.  Musculoskeletal:        General: No swelling.     Right lower leg: No edema.     Left lower leg: No edema.  Lymphadenopathy:     Cervical: No cervical adenopathy.     Upper Body:     Right upper body: No supraclavicular or axillary adenopathy.     Left upper body: No supraclavicular or axillary adenopathy.     Lower Body: No right inguinal adenopathy. No left inguinal adenopathy.  Skin:    General: Skin is warm.     Coloration: Skin is not jaundiced.     Findings: No lesion or rash.  Neurological:     General: No focal deficit present.     Mental Status: She is alert and oriented to person, place, and time. Mental status is at baseline.  Psychiatric:        Mood and Affect: Mood normal.        Behavior: Behavior normal.        Thought Content: Thought content normal.    LABS:     Latest Reference Range & Units 03/31/22 15:31  Transferrin Receptor 12.2 - 27.3 nmol/L 71.9 (H)  Total Protein ELP 6.0 - 8.5 g/dL 6.7 (C)  Albumin SerPl Elph-Mcnc 2.9 - 4.4 g/dL 3.0 (C)  Albumin/Glob  SerPl 0.7 - 1.7  0.9 (C)  Alpha2 Glob SerPl Elph-Mcnc 0.4 - 1.0 g/dL 0.6 (C)  Alpha 1 0.0 - 0.4 g/dL 0.3 (C)  Gamma Glob SerPl Elph-Mcnc 0.4 - 1.8 g/dL 2.0 (H) (C)  M Protein SerPl Elph-Mcnc Not Observed g/dL 1.6 (H) (C)  IFE 1  Comment ! (C)  Globulin, Total 2.2 - 3.9 g/dL 3.7 (C)  B-Globulin SerPl Elph-Mcnc 0.7 - 1.3 g/dL 0.8 (C)  IgG (Immunoglobin G), Serum 586 - 1,602 mg/dL 2,265 (H)  IgM (Immunoglobulin M), Srm 26 - 217 mg/dL 81  IgA 64 - 422 mg/dL 54 (L)  Please Note (HCV):  Comment (C)  Kappa free light chain 3.3 - 19.4 mg/L 26.1 (H)  Lambda free light chains 5.7 - 26.3 mg/L 373.1 (H)  Kappa, lambda light chain ratio 0.26 - 1.65  0.07 (L)  (H): Data is abnormally high !: Data is abnormal (L): Data is abnormally low (C): Corrected  ASSESSMENT & PLAN:  An 86 y.o. female who I recently began seeing for anemia.  In clinic  today, I went over all of her recent labs with her, which showed that she has elevated IgG, lambda light chain, and monoclonal protein levels.  These elevated levels are definitely concerning for multiple myeloma being present.  Based upon this, I will perform a bone marrow biopsy in 2 days to definitively prove the presence of multiple myeloma.  Based upon the elevated soluble transferrin receptor, it also appears she remains iron deficient.  I do anticipate her receiving IV iron in the near future to improve her iron stores.  However, the most important issue to address is whether or not her bone marrow biopsy results qualify her for having multiple myeloma.  I will see her back in 2 weeks to go over her bone marrow biopsy results and their implications.  The patient understands all the plans discussed today and is in agreement with them.  Kirstine Jacquin Macarthur Critchley, MD

## 2022-04-14 ENCOUNTER — Inpatient Hospital Stay (INDEPENDENT_AMBULATORY_CARE_PROVIDER_SITE_OTHER): Payer: Medicare Other | Admitting: Oncology

## 2022-04-14 ENCOUNTER — Other Ambulatory Visit: Payer: Self-pay | Admitting: Oncology

## 2022-04-14 VITALS — BP 135/65 | HR 76 | Temp 98.1°F | Resp 14 | Ht 62.0 in | Wt 136.7 lb

## 2022-04-14 DIAGNOSIS — D472 Monoclonal gammopathy: Secondary | ICD-10-CM | POA: Diagnosis not present

## 2022-04-15 DIAGNOSIS — S81801A Unspecified open wound, right lower leg, initial encounter: Secondary | ICD-10-CM | POA: Diagnosis not present

## 2022-04-16 ENCOUNTER — Other Ambulatory Visit: Payer: Self-pay | Admitting: Oncology

## 2022-04-16 ENCOUNTER — Inpatient Hospital Stay: Payer: Medicare Other

## 2022-04-16 ENCOUNTER — Inpatient Hospital Stay (INDEPENDENT_AMBULATORY_CARE_PROVIDER_SITE_OTHER): Payer: Medicare Other | Admitting: Oncology

## 2022-04-16 ENCOUNTER — Telehealth: Payer: Self-pay | Admitting: Oncology

## 2022-04-16 VITALS — BP 142/64 | HR 80 | Temp 98.2°F | Resp 14 | Ht 62.0 in | Wt 137.1 lb

## 2022-04-16 DIAGNOSIS — D649 Anemia, unspecified: Secondary | ICD-10-CM | POA: Diagnosis not present

## 2022-04-16 DIAGNOSIS — D61818 Other pancytopenia: Secondary | ICD-10-CM | POA: Diagnosis not present

## 2022-04-16 DIAGNOSIS — D472 Monoclonal gammopathy: Secondary | ICD-10-CM

## 2022-04-16 DIAGNOSIS — Z807 Family history of other malignant neoplasms of lymphoid, hematopoietic and related tissues: Secondary | ICD-10-CM | POA: Diagnosis not present

## 2022-04-16 DIAGNOSIS — Z808 Family history of malignant neoplasm of other organs or systems: Secondary | ICD-10-CM | POA: Diagnosis not present

## 2022-04-16 DIAGNOSIS — Z79899 Other long term (current) drug therapy: Secondary | ICD-10-CM | POA: Diagnosis not present

## 2022-04-16 DIAGNOSIS — D7589 Other specified diseases of blood and blood-forming organs: Secondary | ICD-10-CM | POA: Diagnosis not present

## 2022-04-16 DIAGNOSIS — Z803 Family history of malignant neoplasm of breast: Secondary | ICD-10-CM | POA: Diagnosis not present

## 2022-04-16 LAB — CBC AND DIFFERENTIAL
HCT: 24 — AB (ref 36–46)
Hemoglobin: 7.6 — AB (ref 12.0–16.0)
Neutrophils Absolute: 2.73
Platelets: 226 10*3/uL (ref 150–400)
WBC: 4.4

## 2022-04-16 LAB — CBC: RBC: 3.28 — AB (ref 3.87–5.11)

## 2022-04-16 NOTE — Telephone Encounter (Signed)
04/16/22 next appt scheduled and confirmed with patient

## 2022-04-16 NOTE — Progress Notes (Signed)
BONE MARROW PROCEDURE NOTE Due to the patient's positive M-spike and anemia, a bone marrow biopsy was done today for further evaluation.  After consenting for the procedure, the patient's left posterior superior iliac crest was topically sterilized.  Afterwards, a bone marrow core and aspirate were collected, which will be sent for flow cytometry, cytogenetics, and a myeloma FISH panel.  There were no complications experienced with this procedure.

## 2022-04-18 DIAGNOSIS — R609 Edema, unspecified: Secondary | ICD-10-CM | POA: Diagnosis not present

## 2022-04-18 DIAGNOSIS — Z6825 Body mass index (BMI) 25.0-25.9, adult: Secondary | ICD-10-CM | POA: Diagnosis not present

## 2022-04-18 LAB — SURGICAL PATHOLOGY

## 2022-04-21 ENCOUNTER — Ambulatory Visit: Payer: Medicare Other | Attending: Cardiology

## 2022-04-21 DIAGNOSIS — Z8673 Personal history of transient ischemic attack (TIA), and cerebral infarction without residual deficits: Secondary | ICD-10-CM | POA: Diagnosis not present

## 2022-04-22 LAB — CUP PACEART REMOTE DEVICE CHECK
Date Time Interrogation Session: 20231029231644
Implantable Pulse Generator Implant Date: 20200526

## 2022-04-27 ENCOUNTER — Other Ambulatory Visit: Payer: Self-pay | Admitting: Oncology

## 2022-04-27 DIAGNOSIS — D472 Monoclonal gammopathy: Secondary | ICD-10-CM

## 2022-04-27 NOTE — Progress Notes (Unsigned)
[place]  [address] [place],  [place]  [address] [phone]  Clinic Day:  04/28/2022  Referring physician: Carvel Getting Key, *  HISTORY OF PRESENT ILLNESS:  The patient is a 86 y.o. female  who I recently again seen for anemia.  In addition to labs showing chronic suggestive of iron deficiency anemia, the patient also had a positive SPEP.  This led to her receiving additional monoclonal studies to determine, the possibility is for her potentially having multiple myeloma.  He comes in today to go over these results and the implications.  Since her last visit, the patient has been doing fairly well.  She denies having any overt forms of blood loss.  She also denies having increased fatigue or any bone pain which concerns her for multiple myeloma overt repeat present.  PHYSICAL EXAM:  Blood pressure 135/63, pulse 80, temperature 97.7 F (36.5 C), resp. rate 14, height _0  (1.575 m), weight 135 lb 8 oz (61.5 kg), SpO2 90 %. Wt Readings from Last 3 Encounters:  04/28/22 135 lb 8 oz (61.5 kg)  04/16/22 137 lb 1.6 oz (62.2 kg)  04/14/22 136 lb 11.2 oz (62 kg)   Body mass index is 24.78 kg/m. Performance status (ECOG): 1 - Symptomatic but completely ambulatory Physical Exam Constitutional:      Appearance: Normal appearance. She is not ill-appearing.     Comments: She appears mildly weak  HENT:     Mouth/Throat:     Mouth: Mucous membranes are moist.     Pharynx: Oropharynx is clear. No oropharyngeal exudate or posterior oropharyngeal erythema.  Cardiovascular:     Rate and Rhythm: Normal rate and regular rhythm.     Heart sounds: No murmur heard.    No friction rub. No gallop.  Pulmonary:     Effort: Pulmonary effort is normal. No respiratory distress.     Breath sounds: Normal breath sounds. No wheezing, rhonchi or rales.  Abdominal:     General: Bowel sounds are normal. There is no distension.     Palpations: Abdomen is soft. There  is no mass.     Tenderness: There is no abdominal tenderness.  Musculoskeletal:        General: No swelling.     Right lower leg: No edema.     Left lower leg: No edema.  Lymphadenopathy:     Cervical: No cervical adenopathy.     Upper Body:     Right upper body: No supraclavicular or axillary adenopathy.     Left upper body: No supraclavicular or axillary adenopathy.     Lower Body: No right inguinal adenopathy. No left inguinal adenopathy.  Skin:    General: Skin is warm.     Coloration: Skin is not jaundiced.     Findings: No lesion or rash.  Neurological:     General: No focal deficit present.     Mental Status: She is alert and oriented to person, place, and time. Mental status is at baseline.  Psychiatric:        Mood and Affect: Mood normal.        Behavior: Behavior normal.        Thought Content: Thought content normal.    LABS:     Latest Reference Range & Units 03/31/22 15:31  Transferrin Receptor 12.2 - 27.3 nmol/L 71.9 (H)  Total Protein ELP 6.0 - 8.5 g/dL 6.7 (C)  Albumin SerPl Elph-Mcnc 2.9 - 4.4 g/dL 3.0 (C)  Albumin/Glob  SerPl 0.7 - 1.7  0.9 (C)  Alpha2 Glob SerPl Elph-Mcnc 0.4 - 1.0 g/dL 0.6 (C)  Alpha 1 0.0 - 0.4 g/dL 0.3 (C)  Gamma Glob SerPl Elph-Mcnc 0.4 - 1.8 g/dL 2.0 (H) (C)  M Protein SerPl Elph-Mcnc Not Observed g/dL 1.6 (H) (C)  IFE 1  Comment ! (C)  Globulin, Total 2.2 - 3.9 g/dL 3.7 (C)  B-Globulin SerPl Elph-Mcnc 0.7 - 1.3 g/dL 0.8 (C)  IgG (Immunoglobin G), Serum 586 - 1,602 mg/dL 2,265 (H)  IgM (Immunoglobulin M), Srm 26 - 217 mg/dL 81  IgA 64 - 422 mg/dL 54 (L)  Please Note (HCV):  Comment (C)  Kappa free light chain 3.3 - 19.4 mg/L 26.1 (H)  Lambda free light chains 5.7 - 26.3 mg/L 373.1 (H)  Kappa, lambda light chain ratio 0.26 - 1.65  0.07 (L)  (H): Data is abnormally high !: Data is abnormal (L): Data is abnormally low (C): Corrected  PATHOLOGY:  Her bone marrow biopsy revealed the following: BONE MARROW, ASPIRATE, CLOT, CORE:   -Mildly hypercellular bone marrow with a lambda restricted plasmacytosis  of approximately 20% by immunohistochemistry, see note   PERIPHERAL BLOOD:  -Microcytic hypochromic anemia   Note: She has had a known iron deficiency anemia with a history of both  blood transfusions and iron infusion.  The current bone marrow stores  show adequate histiocytic iron stores; however, there is microcytosis  and apparent hypochromasia.   There is an increased lambda restricted plasma cell population  approximately 20% that aberrantly expresses CD56 consistent with a  plasma cell neoplasm this is in conjunction with a finding of a  monoclonal IgG lambda light chain restricted M protein of 1.6 g/dL by  SPEP/IFE which is also identified by kappa/lambda light chains with an  elevation of lambda free light chains of 373.1 mg/L and an inverted  kappa to lambda light chain ratio of 0.07.   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   Overall there is also hypercellularity with mild increase in both the  myeloid and erythroid lineages of uncertain clinical significance there  is no definitive evidence of dyspoiesis identified in this may simply be  reactive in nature; however, an early myeloid neoplasm such as  myelodysplastic syndrome cannot be completely excluded but is not  favored given the current clinical setting and findings.   MICROSCOPIC DESCRIPTION:   PERIPHERAL BLOOD SMEAR: The peripheral blood smear and indices are  reviewed revealing a microcytic, hypochromic anemia.  Upon morphologic  review there is evidence of central pallor to the red cells suggesting  iron deficiency in addition there are some spherocytes present.  There  is no evidence of rouleaux or circulating plasma cells.   BONE MARROW ASPIRATE: The bone marrow aspirate smear slides contain  several cellular bone marrow  spicules which are adequate for  interpretation.  Erythroid precursors: The erythroid series is present in adequate to  increased number with a slight left shift.  There are subset of  moderate-sized cells with an intermediate amount of cytoplasm that is  deeply basophilic that is difficult to differentiate as plasma cells or  erythroid cells.  However there is not significant evidence for  erythroid dysplasia.  Granulocytic precursors: The myeloid series  present in adequate number and has orderly maturation and unremarkable  morphology.  Megakaryocytes: The megakaryocytes are present in adequate number and  have an unremarkable morphology.  Lymphocytes/plasma cells: Plasma cells  are present in increased number  and approximately 10% in a 500 cell count.  They are in general small  and mature in appearance however some have binucleation.  They have  deeply basophilic cytoplasm and some have limited cytoplasm making  definitive differentiation between some red cell precursors and plasma  cells difficult on the count and the overall percentage may actually be  higher than interpreted.  They are focally present in clusters.   TOUCH PREPARATIONS: While a touch prep is adequate for interpretation  the morphology is suboptimal compared to the aspirate, please see above   CLOT AND BIOPSY: The decalcified bone marrow biopsy consists of a  severely fragmented core of periosteum and subcortical bone but with a  hypercellular subcortical marrow of approximately 50%.  This  hypercellularity appears to be predominantly secondary to metaplastic  precursors with a slight left shift but otherwise orderly maturation  with an unremarkable morphology.  Plasma cells are not a prominent  aspect of the morphology on the HE stained slide   The clot section consists of significantly more metaphoric marrow for  evaluation and has a cellularity of approximately 50% which is  hypercellular for age.  While  there is a mild panmyelosis there is  overall orderly maturation and unremarkable morphology to all 3  lineages.  Plasma cells are distinguishable on the clot section and  appear to have an increased interstitial involvement as well as focal  clustering.   IMMUNOHISTOCHEMICAL STAINS: A MUM1 and CD138 stain highlight increased  plasma cells in an interstitial pattern as well as small clusters at  approximately 20% of the cellular marrow, which may account for some of  the aforementioned identified hypercellularity.  The plasma cells  aberrantly express CD56 and are negative for cyclin D1 and CD117.  The  plasma cells are lambda restricted by kappa/lambda ISH.   IRON STAIN: Iron stains are performed on a bone marrow aspirate or touch  imprint smear and section of clot. The controls stained appropriately.        Storage Iron: Adequate histiocytic iron stores       Ring Sideroblasts: Not identified   ADDITIONAL DATA/TESTING: Conventional cytogenetics and FISH panel for  multiple myeloma is pending and will be reported in an addendum   CELL COUNT DATA:   Bone Marrow count performed on 500 cells shows:  Blasts:   0%   Myeloid:  40%  Promyelocytes: 0%   Erythroid:     34%  Myelocytes:    5%   Lymphocytes:   12%  Metamyelocytes:     2%   Plasma cells:  9%  Bands:    8%  Neutrophils:   20%  M:E ratio:     1.18  Eosinophils:   5%  Basophils:     0%  Monocytes:     5%     ASSESSMENT & PLAN:  An 86 y.o. female who I recently began seeing for anemia.  In clinic today, I went over all of her recent labs with her, which showed that she has elevated IgG, lambda light chain, and monoclonal protein levels.  These elevated levels are definitely concerning for multiple myeloma being present.  Based upon this, I will perform a bone marrow biopsy in 2 days to definitively prove the presence of multiple myeloma.  Based upon the elevated soluble transferrin receptor, it also appears she remains iron  deficient.  I do anticipate her receiving IV iron in the near future to improve her  iron stores.  However, the most important issue to address is whether or not her bone marrow biopsy results qualify her for having multiple myeloma.  I will see her back in 2 weeks to go over her bone marrow biopsy results and their implications.  The patient understands all the plans discussed today and is in agreement with them.  Bentlee Benningfield Macarthur Critchley, MD

## 2022-04-28 ENCOUNTER — Telehealth: Payer: Self-pay | Admitting: Pharmacy Technician

## 2022-04-28 ENCOUNTER — Inpatient Hospital Stay: Payer: Medicare Other

## 2022-04-28 ENCOUNTER — Other Ambulatory Visit: Payer: Self-pay | Admitting: Oncology

## 2022-04-28 ENCOUNTER — Telehealth: Payer: Self-pay | Admitting: Oncology

## 2022-04-28 ENCOUNTER — Inpatient Hospital Stay: Payer: Medicare Other | Attending: Oncology | Admitting: Oncology

## 2022-04-28 ENCOUNTER — Encounter (HOSPITAL_COMMUNITY): Payer: Self-pay | Admitting: Oncology

## 2022-04-28 ENCOUNTER — Other Ambulatory Visit (HOSPITAL_COMMUNITY): Payer: Self-pay

## 2022-04-28 VITALS — BP 135/63 | HR 80 | Temp 97.7°F | Resp 14 | Ht 62.0 in | Wt 135.5 lb

## 2022-04-28 DIAGNOSIS — D649 Anemia, unspecified: Secondary | ICD-10-CM

## 2022-04-28 DIAGNOSIS — D472 Monoclonal gammopathy: Secondary | ICD-10-CM

## 2022-04-28 DIAGNOSIS — R768 Other specified abnormal immunological findings in serum: Secondary | ICD-10-CM | POA: Diagnosis not present

## 2022-04-28 LAB — CBC WITH DIFFERENTIAL (CANCER CENTER ONLY)
Abs Immature Granulocytes: 0.03 10*3/uL (ref 0.00–0.07)
Basophils Absolute: 0.1 10*3/uL (ref 0.0–0.1)
Basophils Relative: 1 %
Eosinophils Absolute: 0.2 10*3/uL (ref 0.0–0.5)
Eosinophils Relative: 4 %
HCT: 24.3 % — ABNORMAL LOW (ref 36.0–46.0)
Hemoglobin: 6.9 g/dL — CL (ref 12.0–15.0)
Immature Granulocytes: 1 %
Lymphocytes Relative: 16 %
Lymphs Abs: 0.9 10*3/uL (ref 0.7–4.0)
MCH: 21.3 pg — ABNORMAL LOW (ref 26.0–34.0)
MCHC: 28.4 g/dL — ABNORMAL LOW (ref 30.0–36.0)
MCV: 75 fL — ABNORMAL LOW (ref 80.0–100.0)
Monocytes Absolute: 1 10*3/uL (ref 0.1–1.0)
Monocytes Relative: 19 %
Neutro Abs: 3.2 10*3/uL (ref 1.7–7.7)
Neutrophils Relative %: 59 %
Platelet Count: 256 10*3/uL (ref 150–400)
RBC: 3.24 MIL/uL — ABNORMAL LOW (ref 3.87–5.11)
RDW: 19 % — ABNORMAL HIGH (ref 11.5–15.5)
WBC Count: 5.3 10*3/uL (ref 4.0–10.5)
nRBC: 0 % (ref 0.0–0.2)

## 2022-04-28 LAB — CMP (CANCER CENTER ONLY)
ALT: 15 U/L (ref 0–44)
AST: 27 U/L (ref 15–41)
Albumin: 2.8 g/dL — ABNORMAL LOW (ref 3.5–5.0)
Alkaline Phosphatase: 92 U/L (ref 38–126)
Anion gap: 7 (ref 5–15)
BUN: 21 mg/dL (ref 8–23)
CO2: 22 mmol/L (ref 22–32)
Calcium: 8.7 mg/dL — ABNORMAL LOW (ref 8.9–10.3)
Chloride: 106 mmol/L (ref 98–111)
Creatinine: 1.38 mg/dL — ABNORMAL HIGH (ref 0.44–1.00)
GFR, Estimated: 38 mL/min — ABNORMAL LOW (ref >60)
Glucose, Bld: 110 mg/dL — ABNORMAL HIGH (ref 70–99)
Potassium: 4.3 mmol/L (ref 3.5–5.1)
Sodium: 135 mmol/L (ref 135–145)
Total Bilirubin: 0.2 mg/dL — ABNORMAL LOW (ref 0.3–1.2)
Total Protein: 7 g/dL (ref 6.5–8.1)

## 2022-04-28 MED ORDER — DEXAMETHASONE 4 MG PO TABS: ORAL_TABLET | ORAL | 5 refills | Status: DC

## 2022-04-28 MED ORDER — LENALIDOMIDE 10 MG PO CAPS
10.0000 mg | ORAL_CAPSULE | Freq: Every day | ORAL | 5 refills | Status: DC
  Filled 2022-04-28: qty 21, 21d supply, fill #0

## 2022-04-28 NOTE — Telephone Encounter (Signed)
04/28/22 Next appt scheduled and confirmed with patient

## 2022-04-28 NOTE — Telephone Encounter (Signed)
Received New start notification for  Revlimid . Will update as we work through the benefits process.

## 2022-04-29 ENCOUNTER — Other Ambulatory Visit (HOSPITAL_COMMUNITY): Payer: Self-pay

## 2022-04-29 ENCOUNTER — Other Ambulatory Visit: Payer: Medicare Other

## 2022-04-29 ENCOUNTER — Inpatient Hospital Stay: Payer: Medicare Other

## 2022-04-29 ENCOUNTER — Telehealth: Payer: Self-pay | Admitting: Pharmacist

## 2022-04-29 ENCOUNTER — Other Ambulatory Visit: Payer: Self-pay | Admitting: Hematology and Oncology

## 2022-04-29 DIAGNOSIS — R768 Other specified abnormal immunological findings in serum: Secondary | ICD-10-CM | POA: Diagnosis not present

## 2022-04-29 DIAGNOSIS — D649 Anemia, unspecified: Secondary | ICD-10-CM

## 2022-04-29 DIAGNOSIS — S81801A Unspecified open wound, right lower leg, initial encounter: Secondary | ICD-10-CM | POA: Diagnosis not present

## 2022-04-29 DIAGNOSIS — D472 Monoclonal gammopathy: Secondary | ICD-10-CM

## 2022-04-29 LAB — BETA 2 MICROGLOBULIN, SERUM: Beta-2 Microglobulin: 5.6 mg/L — ABNORMAL HIGH (ref 0.6–2.4)

## 2022-04-29 LAB — ABO/RH: ABO/RH(D): O POS

## 2022-04-29 LAB — PREPARE RBC (CROSSMATCH)

## 2022-04-29 MED ORDER — LENALIDOMIDE 10 MG PO CAPS: 10.0000 mg | ORAL_CAPSULE | Freq: Every day | ORAL | 0 refills | Status: DC

## 2022-04-29 NOTE — Telephone Encounter (Signed)
Submitted a Prior Authorization request to [facility] for  Lenalidomide 10MG  via CoverMyMeds. Will update once we receive a response.   Key: [id number] - [place] Case ID: [id number]   Patient has been approved for $12,000 Multiple Myeloma copay grant through [facility]. Approved through 03/30/22 through 03/30/23.   Processing information:   CARD NO. [id number]   CARD STATUS Active   BIN [id number]   PCN [id number]   PC GROUP [id number]   HELP DESK [phone]

## 2022-04-29 NOTE — Telephone Encounter (Signed)
Oral Oncology Patient Advocate Encounter  Prior Authorization for Lenalidomide 10MG has been approved.    PA# Key: [id number] - PA Case ID: [id number] Effective dates: 04/29/2022 until further notice.  Rx to be sent to [facility] will continue to follow.

## 2022-04-29 NOTE — Telephone Encounter (Signed)
Oral Oncology Pharmacist Encounter  Received new prescription for Revlimid (lenalidomide) for the treatment of multiple myeloma in conjunction with dexamethasone, planned duration until disease progression or unacceptable drug toxicity.  CMP and CBC w/ Diff from 04/29/22 assessed, pt with Scr of 1.38 (CrCl borderline at 29.5 mL/min) - dosing per MD - Revlimid decreased to 10 mg/d 21 days on/7 off repeat every 28 days. Pt with hxo anemia, Hgb down to 6.9 g/dL on 11/6. Prescription dose and frequency assessed.  Current medication list in Epic reviewed, no relevant/significant DDIs with Revlimid identified.  Evaluated chart and no patient barriers to medication adherence noted.   Once [employer] authorization # is obtained, prescription will need to be redirected to [facility] for dispensing.   Oral Oncology Clinic will continue to follow for insurance authorization, copayment issues, initial counseling and start date.  Leron Croak, PharmD, BCPS, [facility] Hematology/Oncology Clinical Pharmacist [facility] [place] [phone] 04/29/2022 9:01 AM

## 2022-04-30 ENCOUNTER — Inpatient Hospital Stay: Payer: Medicare Other

## 2022-04-30 DIAGNOSIS — D649 Anemia, unspecified: Secondary | ICD-10-CM | POA: Diagnosis not present

## 2022-04-30 DIAGNOSIS — R768 Other specified abnormal immunological findings in serum: Secondary | ICD-10-CM | POA: Diagnosis not present

## 2022-04-30 MED ORDER — SODIUM CHLORIDE 0.9% IV SOLUTION: 250.0000 mL | Freq: Once | INTRAVENOUS | Status: DC

## 2022-04-30 MED ORDER — ACETAMINOPHEN 325 MG PO TABS: 650.0000 mg | ORAL_TABLET | Freq: Once | ORAL | Status: DC

## 2022-04-30 MED ORDER — DIPHENHYDRAMINE HCL 25 MG PO CAPS
25.0000 mg | ORAL_CAPSULE | Freq: Once | ORAL | Status: AC
  Administered 2022-04-30: 25 mg via ORAL
  Filled 2022-04-30: qty 1

## 2022-04-30 NOTE — Patient Instructions (Signed)

## 2022-05-01 ENCOUNTER — Other Ambulatory Visit: Payer: Medicare Other

## 2022-05-01 ENCOUNTER — Ambulatory Visit: Payer: Medicare Other | Admitting: Oncology

## 2022-05-01 LAB — BPAM RBC
Blood Product Expiration Date: 202312052359
ISSUE DATE / TIME: 202311080737
Unit Type and Rh: 5100

## 2022-05-01 LAB — TYPE AND SCREEN
ABO/RH(D): O POS
Antibody Screen: NEGATIVE
Unit division: 0

## 2022-05-01 NOTE — Telephone Encounter (Addendum)
Oral Chemotherapy Pharmacist Encounter  I spoke with patient for overview of: Revlimid for the treatment of multiple myeloma in conjunction with dexamethasone, planned duration until disease progression or unacceptable drug toxicity.  Counseled patient on administration, dosing, side effects, monitoring, drug-food interactions, safe handling, storage, and disposal.  Patient will take Revlimid 629m capsules, 1 capsule by mouth once daily, without regard to food, with a full glass of water.  Revlimid will be given 21 days on, 7 days off, repeat every 28 days.  Patient will take dexamethasone 439mtablets, 1 tablet (29m19mby mouth once daily Monday through Friday with breakfast. Patient will pick this up from her pharmacy to start on 05/05/22.  Revlimid and dexamethasone start date: 05/05/22  Adverse effects of Revlimid include but are not limited to: nausea, GI upset, abdominal pain, rash, fatigue, and decreased blood counts.   VTE PPX: Patient counseled on importance of daily aspirin 63m33mr VTE prophylaxis. Patient endorses already taking this once daily.  Reviewed with patient importance of keeping a medication schedule and plan for any missed doses. No barriers to medication adherence identified.  Medication reconciliation performed and medication/allergy list updated.  Patient will pick up dexamethasone prescriptions and will wait until cycle 1 day 1 prior to starting dexamethasone.  Insurance authorization for Revlimid has been obtained.  Revlimid prescription is being dispensed from [facility] is a limited distribution medication. This will be delivered  All questions answered.  Ms. SmitEltringhamced understanding and appreciation.   Medication education handout and medication calendar placed in mail for patient. Patient knows to call the office with questions or concerns. Oral Chemotherapy Clinic phone number provided to patient.   RebeLeron CroakarmD,  BCPS, BCOPTampa Bay Surgery Center Ltdatology/Oncology Clinical Pharmacist [facility] [place]-[phone]/2023 2:16 PM

## 2022-05-02 ENCOUNTER — Telehealth: Payer: Self-pay

## 2022-05-02 NOTE — Telephone Encounter (Signed)
Dr Bobby Rumpf notified that pt will start Revlimid on 05/05/2022. He responded, "ok".

## 2022-05-07 DIAGNOSIS — R609 Edema, unspecified: Secondary | ICD-10-CM | POA: Diagnosis not present

## 2022-05-07 DIAGNOSIS — R600 Localized edema: Secondary | ICD-10-CM | POA: Diagnosis not present

## 2022-05-13 ENCOUNTER — Telehealth: Payer: Self-pay

## 2022-05-13 DIAGNOSIS — S81801A Unspecified open wound, right lower leg, initial encounter: Secondary | ICD-10-CM | POA: Diagnosis not present

## 2022-05-13 NOTE — Telephone Encounter (Signed)
I called pt to see how she is tolerating the Revlimid. She feels she isn't having any side effects from the Revlimid. However, she feels like she has a little cold that the family has passed around. Her voice is hoarse, but she states she is feeling better. No fevers. She is taking the Revlimid in afternoon with last meal. No missed doses. She denies N/V, diarrhea, skin rash/itching, SOB, and fevers. I reminded her to call us if she develops temp of 100.4 or higher, day or night. She verbalized understanding. She has appt here tomorrow w/Kaitlyn,RPH.

## 2022-05-14 ENCOUNTER — Inpatient Hospital Stay: Payer: Medicare Other

## 2022-05-14 ENCOUNTER — Other Ambulatory Visit: Payer: Self-pay | Admitting: Oncology

## 2022-05-14 ENCOUNTER — Telehealth: Payer: Self-pay | Admitting: Cardiology

## 2022-05-14 DIAGNOSIS — D472 Monoclonal gammopathy: Secondary | ICD-10-CM

## 2022-05-14 DIAGNOSIS — D649 Anemia, unspecified: Secondary | ICD-10-CM

## 2022-05-14 LAB — CBC AND DIFFERENTIAL
HCT: 24 — AB (ref 36–46)
Hemoglobin: 7.4 — AB (ref 12.0–16.0)
Neutrophils Absolute: 7.59
Platelets: 371 10*3/uL (ref 150–400)
WBC: 11

## 2022-05-14 LAB — BASIC METABOLIC PANEL
BUN: 19 (ref 4–21)
CO2: 20 (ref 13–22)
Chloride: 107 (ref 99–108)
Creatinine: 0.9 (ref 0.5–1.1)
Glucose: 70
Potassium: 3.7 mEq/L (ref 3.5–5.1)
Sodium: 134 — AB (ref 137–147)

## 2022-05-14 LAB — COMPREHENSIVE METABOLIC PANEL
Albumin: 3 — AB (ref 3.5–5.0)
Calcium: 7.9 — AB (ref 8.7–10.7)

## 2022-05-14 LAB — HEPATIC FUNCTION PANEL
ALT: 32 U/L (ref 7–35)
AST: 28 (ref 13–35)
Alkaline Phosphatase: 120 (ref 25–125)
Bilirubin, Total: 0.5

## 2022-05-14 LAB — CBC: RBC: 3.2 — AB (ref 3.87–5.11)

## 2022-05-14 NOTE — Telephone Encounter (Signed)
Pt states that her doctor requested that she let Dr. Geraldo Pitter know that her iron is very low and that they feel this is her problem rather than it being her heart. Dr. Geraldo Pitter is aware.

## 2022-05-14 NOTE — Telephone Encounter (Signed)
Pt would like a callback from nurse regarding her iron. Pt states that she was told that she may not have an heart issue it may just be her iron level. Please advise

## 2022-05-19 ENCOUNTER — Telehealth: Payer: Self-pay | Admitting: Oncology

## 2022-05-19 NOTE — Telephone Encounter (Signed)
Patient has been scheduled. Aware of appt date and time   Scheduling Message Entered by Juanetta Beets on 05/14/2022 at  3:21 PM Priority: Routine <No visit type provided>  Department: [facility]  Provider:  Appointment Notes:  Please schedule pt for 2 doses of feraheme.  Scheduling Notes:

## 2022-05-20 DIAGNOSIS — R059 Cough, unspecified: Secondary | ICD-10-CM | POA: Diagnosis not present

## 2022-05-20 DIAGNOSIS — J439 Emphysema, unspecified: Secondary | ICD-10-CM | POA: Diagnosis not present

## 2022-05-20 DIAGNOSIS — R188 Other ascites: Secondary | ICD-10-CM | POA: Diagnosis not present

## 2022-05-20 DIAGNOSIS — R531 Weakness: Secondary | ICD-10-CM | POA: Diagnosis not present

## 2022-05-20 DIAGNOSIS — R062 Wheezing: Secondary | ICD-10-CM | POA: Diagnosis not present

## 2022-05-20 DIAGNOSIS — I959 Hypotension, unspecified: Secondary | ICD-10-CM | POA: Diagnosis not present

## 2022-05-20 DIAGNOSIS — R0602 Shortness of breath: Secondary | ICD-10-CM | POA: Diagnosis not present

## 2022-05-20 DIAGNOSIS — D649 Anemia, unspecified: Secondary | ICD-10-CM | POA: Diagnosis not present

## 2022-05-20 DIAGNOSIS — R9431 Abnormal electrocardiogram [ECG] [EKG]: Secondary | ICD-10-CM | POA: Diagnosis not present

## 2022-05-20 DIAGNOSIS — R0902 Hypoxemia: Secondary | ICD-10-CM | POA: Diagnosis not present

## 2022-05-20 DIAGNOSIS — R7989 Other specified abnormal findings of blood chemistry: Secondary | ICD-10-CM | POA: Diagnosis not present

## 2022-05-20 DIAGNOSIS — N179 Acute kidney failure, unspecified: Secondary | ICD-10-CM | POA: Diagnosis not present

## 2022-05-20 MED FILL — Ferumoxytol Inj 510 MG/17ML (30 MG/ML) (Elemental Fe): INTRAVENOUS | Qty: 17 | Status: AC

## 2022-05-21 ENCOUNTER — Inpatient Hospital Stay: Payer: Medicare Other

## 2022-05-21 DIAGNOSIS — K7460 Unspecified cirrhosis of liver: Secondary | ICD-10-CM | POA: Diagnosis not present

## 2022-05-21 DIAGNOSIS — I252 Old myocardial infarction: Secondary | ICD-10-CM | POA: Diagnosis not present

## 2022-05-21 DIAGNOSIS — R5381 Other malaise: Secondary | ICD-10-CM | POA: Diagnosis not present

## 2022-05-21 DIAGNOSIS — F32A Depression, unspecified: Secondary | ICD-10-CM | POA: Diagnosis not present

## 2022-05-21 DIAGNOSIS — R188 Other ascites: Secondary | ICD-10-CM | POA: Diagnosis not present

## 2022-05-21 DIAGNOSIS — J441 Chronic obstructive pulmonary disease with (acute) exacerbation: Secondary | ICD-10-CM | POA: Diagnosis not present

## 2022-05-21 DIAGNOSIS — N170 Acute kidney failure with tubular necrosis: Secondary | ICD-10-CM | POA: Diagnosis not present

## 2022-05-21 DIAGNOSIS — R531 Weakness: Secondary | ICD-10-CM | POA: Diagnosis not present

## 2022-05-21 DIAGNOSIS — Z8673 Personal history of transient ischemic attack (TIA), and cerebral infarction without residual deficits: Secondary | ICD-10-CM | POA: Diagnosis not present

## 2022-05-21 DIAGNOSIS — Z1152 Encounter for screening for COVID-19: Secondary | ICD-10-CM | POA: Diagnosis not present

## 2022-05-21 DIAGNOSIS — M1990 Unspecified osteoarthritis, unspecified site: Secondary | ICD-10-CM | POA: Diagnosis not present

## 2022-05-21 DIAGNOSIS — Z7982 Long term (current) use of aspirin: Secondary | ICD-10-CM | POA: Diagnosis not present

## 2022-05-21 DIAGNOSIS — R778 Other specified abnormalities of plasma proteins: Secondary | ICD-10-CM | POA: Diagnosis not present

## 2022-05-21 DIAGNOSIS — Z880 Allergy status to penicillin: Secondary | ICD-10-CM | POA: Diagnosis not present

## 2022-05-21 DIAGNOSIS — I10 Essential (primary) hypertension: Secondary | ICD-10-CM | POA: Diagnosis not present

## 2022-05-21 DIAGNOSIS — N179 Acute kidney failure, unspecified: Secondary | ICD-10-CM | POA: Diagnosis not present

## 2022-05-21 DIAGNOSIS — R9431 Abnormal electrocardiogram [ECG] [EKG]: Secondary | ICD-10-CM | POA: Diagnosis not present

## 2022-05-21 DIAGNOSIS — J439 Emphysema, unspecified: Secondary | ICD-10-CM | POA: Diagnosis not present

## 2022-05-21 DIAGNOSIS — Z79899 Other long term (current) drug therapy: Secondary | ICD-10-CM | POA: Diagnosis not present

## 2022-05-21 DIAGNOSIS — J9601 Acute respiratory failure with hypoxia: Secondary | ICD-10-CM | POA: Diagnosis not present

## 2022-05-21 DIAGNOSIS — R059 Cough, unspecified: Secondary | ICD-10-CM | POA: Diagnosis not present

## 2022-05-21 DIAGNOSIS — I340 Nonrheumatic mitral (valve) insufficiency: Secondary | ICD-10-CM | POA: Diagnosis not present

## 2022-05-21 DIAGNOSIS — Z87440 Personal history of urinary (tract) infections: Secondary | ICD-10-CM | POA: Diagnosis not present

## 2022-05-21 DIAGNOSIS — E039 Hypothyroidism, unspecified: Secondary | ICD-10-CM | POA: Diagnosis not present

## 2022-05-21 DIAGNOSIS — I361 Nonrheumatic tricuspid (valve) insufficiency: Secondary | ICD-10-CM | POA: Diagnosis not present

## 2022-05-21 DIAGNOSIS — D649 Anemia, unspecified: Secondary | ICD-10-CM | POA: Diagnosis not present

## 2022-05-21 DIAGNOSIS — E860 Dehydration: Secondary | ICD-10-CM | POA: Diagnosis not present

## 2022-05-21 DIAGNOSIS — K754 Autoimmune hepatitis: Secondary | ICD-10-CM | POA: Diagnosis not present

## 2022-05-21 DIAGNOSIS — Z87891 Personal history of nicotine dependence: Secondary | ICD-10-CM | POA: Diagnosis not present

## 2022-05-21 DIAGNOSIS — K219 Gastro-esophageal reflux disease without esophagitis: Secondary | ICD-10-CM | POA: Diagnosis not present

## 2022-05-21 DIAGNOSIS — C9000 Multiple myeloma not having achieved remission: Secondary | ICD-10-CM | POA: Diagnosis not present

## 2022-05-21 DIAGNOSIS — D509 Iron deficiency anemia, unspecified: Secondary | ICD-10-CM | POA: Diagnosis not present

## 2022-05-21 DIAGNOSIS — I2510 Atherosclerotic heart disease of native coronary artery without angina pectoris: Secondary | ICD-10-CM | POA: Diagnosis not present

## 2022-05-21 DIAGNOSIS — R7989 Other specified abnormal findings of blood chemistry: Secondary | ICD-10-CM | POA: Diagnosis not present

## 2022-05-21 DIAGNOSIS — E7800 Pure hypercholesterolemia, unspecified: Secondary | ICD-10-CM | POA: Diagnosis not present

## 2022-05-21 DIAGNOSIS — Z7960 Long term (current) use of unspecified immunomodulators and immunosuppressants: Secondary | ICD-10-CM | POA: Diagnosis not present

## 2022-05-21 DIAGNOSIS — R0602 Shortness of breath: Secondary | ICD-10-CM | POA: Diagnosis not present

## 2022-05-23 DIAGNOSIS — I340 Nonrheumatic mitral (valve) insufficiency: Secondary | ICD-10-CM

## 2022-05-23 DIAGNOSIS — I361 Nonrheumatic tricuspid (valve) insufficiency: Secondary | ICD-10-CM

## 2022-05-24 NOTE — Progress Notes (Signed)
Carelink Summary Report / Loop Recorder 

## 2022-05-26 ENCOUNTER — Ambulatory Visit (INDEPENDENT_AMBULATORY_CARE_PROVIDER_SITE_OTHER): Payer: Medicare Other

## 2022-05-26 DIAGNOSIS — I63119 Cerebral infarction due to embolism of unspecified vertebral artery: Secondary | ICD-10-CM

## 2022-05-26 NOTE — Progress Notes (Signed)
  [facility]  Telephone: [phone]  Referring oncologist: Lavera Guise, MD   HISTORY OF PRESENT ILLNESS: Stephanie Love is an 86 year old female for the treatment of multiple myeloma and was started on revlimid in combination with dexamethasone. Patient started treatment on 05/05/22. Patient reports for in clinic visit for a ~10 day follow up.     LABS:  Latest Reference Range & Units 04/28/22 11:35 05/14/22 00:00  Sodium 137 - 147  135 134 ! (E)  Potassium 3.5 - 5.1 mEq/L 4.3 3.7 (E)  Chloride 99 - 108  106 107 (E)  CO2 13 - _0 (E)  Glucose 70 - 99 mg/dL 110 (H) 70 (E)  BUN 4 - _1 (E)  Creatinine 0.5 - 1.1  1.38 (H) 0.9 (E)  Calcium 8.7 - 10.7  8.7 (L) 7.9 ! (E)  Anion gap 5 - 15  7   Alkaline Phosphatase 25 - 125  92 120 (E)  Albumin 3.5 - 5.0  2.8 (L) 3.0 ! (E)  AST 13 - 35  27 28 (E)  ALT 7 - 35 U/L 15 32 (E)  Total Protein 6.5 - 8.1 g/dL 7.0   Total Bilirubin 0.3 - 1.2 mg/dL 0.2 (L)   Bilirubin, Total   0.5 (E)    Latest Reference Range & Units 04/28/22 11:35 05/14/22 00:00  WBC 4.0 - 10.5 K/uL 5.3 11.0 (E)  RBC 3.87 - 5.11  3.24 (L) 3.20 ! (E)  Hemoglobin 12.0 - 16.0  6.9 (LL) 7.4 ! (E)  HCT 36 - 46  24.3 (L) 24 ! (E)  MCV 80.0 - 100.0 fL 75.0 (L)   MCH 26.0 - 34.0 pg 21.3 (L)   MCHC 30.0 - 36.0 g/dL 28.4 (L)   RDW 11.5 - 15.5 % 19.0 (H)   Platelets 150 - 400 K/uL 256 371 (E)  nRBC 0.0 - 0.2 % 0.0    ASSESSMENT & PLAN: Patient report that she is very weak at the moment and she believes that she has gotten a cold from her relatives. She is needing some help with walking. She states that she has not had any side effects from the revlimid besides the fatigue that she attributing to the cold she is feeling. I spoke with MD about getting some labs due to the fatigue and Dr. Bobby Rumpf agreed and put in orders for CBC and CMP. Results from CBC showed that patient is low on iron and Dr. Bobby Rumpf would like her to  receive some IV iron next week in clinic. Patient also states that this fatigue is around her neck and a little bit of shortness of breath and was wondering if she should notify her cardiologist. I told patient to please notify the cardiologist of the feelings she was having so they are aware as well in case they want to schedule a visit with her.  Patient is aware and agrees with the plan above.   Patients next visit with Dr. Bobby Rumpf is on 06/10/22 with labs.   Stephanie Love, PharmD Hematology/Oncology Clinical Pharmacist [facility] [phone]

## 2022-05-27 ENCOUNTER — Telehealth: Payer: Self-pay

## 2022-05-27 LAB — CUP PACEART REMOTE DEVICE CHECK
Date Time Interrogation Session: 20231203232636
Implantable Pulse Generator Implant Date: 20200526

## 2022-05-27 NOTE — Telephone Encounter (Signed)
I attempted call to see how pt is feeling. She was in the hospital recently for 4 days w/COPD exacerbation.

## 2022-05-28 ENCOUNTER — Inpatient Hospital Stay: Payer: Medicare Other

## 2022-05-28 DIAGNOSIS — D649 Anemia, unspecified: Secondary | ICD-10-CM | POA: Diagnosis not present

## 2022-05-28 DIAGNOSIS — Z6822 Body mass index (BMI) 22.0-22.9, adult: Secondary | ICD-10-CM | POA: Diagnosis not present

## 2022-05-28 DIAGNOSIS — K7460 Unspecified cirrhosis of liver: Secondary | ICD-10-CM | POA: Diagnosis not present

## 2022-05-28 DIAGNOSIS — J449 Chronic obstructive pulmonary disease, unspecified: Secondary | ICD-10-CM | POA: Diagnosis not present

## 2022-05-28 DIAGNOSIS — C9000 Multiple myeloma not having achieved remission: Secondary | ICD-10-CM | POA: Diagnosis not present

## 2022-05-29 ENCOUNTER — Ambulatory Visit: Payer: Medicare Other | Admitting: Oncology

## 2022-05-29 ENCOUNTER — Encounter: Payer: Self-pay | Admitting: Oncology

## 2022-05-29 ENCOUNTER — Other Ambulatory Visit: Payer: Medicare Other

## 2022-05-29 ENCOUNTER — Telehealth: Payer: Self-pay

## 2022-05-29 NOTE — Telephone Encounter (Signed)
Dr Bobby Rumpf would like pt to have labs today or tomorrow, and then to see him 1 week later for f/u. Appts made for 12/8 labs, 12/14 f/u. Pt asked if she could go ahead and start the Revlimid on Monday. Dr Bobby Rumpf "that will be fine'.

## 2022-05-30 ENCOUNTER — Inpatient Hospital Stay: Payer: Medicare Other | Attending: Oncology

## 2022-05-30 DIAGNOSIS — D649 Anemia, unspecified: Secondary | ICD-10-CM

## 2022-05-30 DIAGNOSIS — Z79899 Other long term (current) drug therapy: Secondary | ICD-10-CM | POA: Insufficient documentation

## 2022-05-30 DIAGNOSIS — C9000 Multiple myeloma not having achieved remission: Secondary | ICD-10-CM | POA: Insufficient documentation

## 2022-05-30 DIAGNOSIS — D509 Iron deficiency anemia, unspecified: Secondary | ICD-10-CM | POA: Insufficient documentation

## 2022-05-30 DIAGNOSIS — D638 Anemia in other chronic diseases classified elsewhere: Secondary | ICD-10-CM

## 2022-05-30 LAB — IRON AND TIBC
Iron: 30 ug/dL (ref 28–170)
Saturation Ratios: 7 % — ABNORMAL LOW (ref 10.4–31.8)
TIBC: 445 ug/dL (ref 250–450)
UIBC: 415 ug/dL

## 2022-05-30 LAB — CBC WITH DIFFERENTIAL (CANCER CENTER ONLY)
Abs Immature Granulocytes: 0.11 10*3/uL — ABNORMAL HIGH (ref 0.00–0.07)
Basophils Absolute: 0.1 10*3/uL (ref 0.0–0.1)
Basophils Relative: 1 %
Eosinophils Absolute: 0.6 10*3/uL — ABNORMAL HIGH (ref 0.0–0.5)
Eosinophils Relative: 7 %
HCT: 30.1 % — ABNORMAL LOW (ref 36.0–46.0)
Hemoglobin: 8.9 g/dL — ABNORMAL LOW (ref 12.0–15.0)
Immature Granulocytes: 1 %
Lymphocytes Relative: 15 %
Lymphs Abs: 1.5 10*3/uL (ref 0.7–4.0)
MCH: 23.1 pg — ABNORMAL LOW (ref 26.0–34.0)
MCHC: 29.6 g/dL — ABNORMAL LOW (ref 30.0–36.0)
MCV: 78.2 fL — ABNORMAL LOW (ref 80.0–100.0)
Monocytes Absolute: 1.2 10*3/uL — ABNORMAL HIGH (ref 0.1–1.0)
Monocytes Relative: 12 %
Neutro Abs: 6 10*3/uL (ref 1.7–7.7)
Neutrophils Relative %: 64 %
Platelet Count: 549 10*3/uL — ABNORMAL HIGH (ref 150–400)
RBC: 3.85 MIL/uL — ABNORMAL LOW (ref 3.87–5.11)
RDW: 27 % — ABNORMAL HIGH (ref 11.5–15.5)
WBC Count: 9.5 10*3/uL (ref 4.0–10.5)
nRBC: 0 % (ref 0.0–0.2)

## 2022-05-30 LAB — CMP (CANCER CENTER ONLY)
ALT: 32 U/L (ref 0–44)
AST: 28 U/L (ref 15–41)
Albumin: 2.6 g/dL — ABNORMAL LOW (ref 3.5–5.0)
Alkaline Phosphatase: 107 U/L (ref 38–126)
Anion gap: 9 (ref 5–15)
BUN: 24 mg/dL — ABNORMAL HIGH (ref 8–23)
CO2: 24 mmol/L (ref 22–32)
Calcium: 8.6 mg/dL — ABNORMAL LOW (ref 8.9–10.3)
Chloride: 106 mmol/L (ref 98–111)
Creatinine: 1.32 mg/dL — ABNORMAL HIGH (ref 0.44–1.00)
GFR, Estimated: 40 mL/min — ABNORMAL LOW (ref >60)
Glucose, Bld: 100 mg/dL — ABNORMAL HIGH (ref 70–99)
Potassium: 3.8 mmol/L (ref 3.5–5.1)
Sodium: 139 mmol/L (ref 135–145)
Total Bilirubin: 0.5 mg/dL (ref 0.3–1.2)
Total Protein: 7 g/dL (ref 6.5–8.1)

## 2022-05-30 LAB — FERRITIN: Ferritin: 14 ng/mL (ref 11–307)

## 2022-05-30 LAB — TSH: TSH: 14.021 u[IU]/mL — ABNORMAL HIGH (ref 0.350–4.500)

## 2022-05-30 MED FILL — Ferumoxytol Inj 510 MG/17ML (30 MG/ML) (Elemental Fe): INTRAVENOUS | Qty: 17 | Status: AC

## 2022-06-02 ENCOUNTER — Inpatient Hospital Stay: Payer: Medicare Other

## 2022-06-02 VITALS — BP 96/63 | HR 82 | Temp 98.2°F | Resp 16 | Wt 119.8 lb

## 2022-06-02 DIAGNOSIS — C9000 Multiple myeloma not having achieved remission: Secondary | ICD-10-CM | POA: Diagnosis not present

## 2022-06-02 DIAGNOSIS — Z79899 Other long term (current) drug therapy: Secondary | ICD-10-CM | POA: Diagnosis not present

## 2022-06-02 DIAGNOSIS — D509 Iron deficiency anemia, unspecified: Secondary | ICD-10-CM | POA: Diagnosis not present

## 2022-06-02 LAB — KAPPA/LAMBDA LIGHT CHAINS
Kappa free light chain: 61.1 mg/L — ABNORMAL HIGH (ref 3.3–19.4)
Kappa, lambda light chain ratio: 0.15 — ABNORMAL LOW (ref 0.26–1.65)
Lambda free light chains: 413.8 mg/L — ABNORMAL HIGH (ref 5.7–26.3)

## 2022-06-02 MED ORDER — SODIUM CHLORIDE 0.9 % IV SOLN
510.0000 mg | Freq: Once | INTRAVENOUS | Status: AC
  Administered 2022-06-02: 510 mg via INTRAVENOUS
  Filled 2022-06-02: qty 510
  Filled 2022-06-02: qty 17

## 2022-06-02 MED ORDER — SODIUM CHLORIDE 0.9 % IV SOLN: Freq: Once | INTRAVENOUS | Status: AC

## 2022-06-02 NOTE — Patient Instructions (Signed)
Ferumoxytol Injection What is this medication? FERUMOXYTOL (FER ue MOX i tol) treats low levels of iron in your body (iron deficiency anemia). Iron is a mineral that plays an important role in making red blood cells, which carry oxygen from your lungs to the rest of your body. This medicine may be used for other purposes; ask your health care provider or pharmacist if you have questions. COMMON BRAND NAME(S): Feraheme What should I tell my care team before I take this medication? They need to know if you have any of these conditions: Anemia not caused by low iron levels High levels of iron in the blood Magnetic resonance imaging (MRI) test scheduled An unusual or allergic reaction to iron, other medications, foods, dyes, or preservatives Pregnant or trying to get pregnant Breastfeeding How should I use this medication? This medication is injected into a vein. It is given by your care team in a hospital or clinic setting. Talk to your care team the use of this medication in children. Special care may be needed. Overdosage: If you think you have taken too much of this medicine contact a poison control center or emergency room at once. NOTE: This medicine is only for you. Do not share this medicine with others. What if I miss a dose? It is important not to miss your dose. Call your care team if you are unable to keep an appointment. What may interact with this medication? Other iron products This list may not describe all possible interactions. Give your health care provider a list of all the medicines, herbs, non-prescription drugs, or dietary supplements you use. Also tell them if you smoke, drink alcohol, or use illegal drugs. Some items may interact with your medicine. What should I watch for while using this medication? Visit your care team regularly. Tell your care team if your symptoms do not start to get better or if they get worse. You may need blood work done while you are taking this  medication. You may need to follow a special diet. Talk to your care team. Foods that contain iron include: whole grains/cereals, dried fruits, beans, or peas, leafy green vegetables, and organ meats (liver, kidney). What side effects may I notice from receiving this medication? Side effects that you should report to your care team as soon as possible: Allergic reactions--skin rash, itching, hives, swelling of the face, lips, tongue, or throat Low blood pressure--dizziness, feeling faint or lightheaded, blurry vision Shortness of breath Side effects that usually do not require medical attention (report to your care team if they continue or are bothersome): Flushing Headache Joint pain Muscle pain Nausea Pain, redness, or irritation at injection site This list may not describe all possible side effects. Call your doctor for medical advice about side effects. You may report side effects to FDA at 1-800-FDA-1088. Where should I keep my medication? This medication is given in a hospital or clinic and will not be stored at home. NOTE: This sheet is a summary. It may not cover all possible information. If you have questions about this medicine, talk to your doctor, pharmacist, or health care provider.  2023 Elsevier/Gold Standard (2020-10-31 00:00:00)

## 2022-06-04 NOTE — Progress Notes (Signed)
[facility]  [address] [place],  [place]  [address] [phone]  Clinic Day:  06/05/2022  Referring physician: Carvel Getting Key, *  HISTORY OF PRESENT ILLNESS:  The patient is a 86 y.o. female with ISS stage III IgG lambda multiple myeloma.  This was proven per a recent bone marrow biopsy done in late October 2023.  Based upon these findings, the patient was recently started on Revlimid/Decadron.  She comes in today to be evaluated before she heads into her second cycle of treatment.  Overall, the patient claims to be doing well.  In fact, she claims to be feeling better since her myeloma regimen was started.  She denies having increased fatigue, bone pain, or other symptoms which concern for progression of her multiple myeloma.  Of note, this patient also has a history iron deficiency anemia related to AVMs and esophageal varices secondary to NASH cirrhosis.  PHYSICAL EXAM:  Blood pressure 137/64, pulse 78, temperature 97.6 F (36.4 C), resp. rate 20, height _0  (1.575 m), weight 121 lb 4.8 oz (55 kg), SpO2 97 %. Wt Readings from Last 3 Encounters:  06/06/22 118 lb (53.5 kg)  06/05/22 121 lb 4.8 oz (55 kg)  06/02/22 119 lb 12.8 oz (54.3 kg)   Body mass index is 22.19 kg/m. Performance status (ECOG): 1 - Symptomatic but completely ambulatory Physical Exam Constitutional:      Appearance: Normal appearance. She is not ill-appearing.     Comments: She appears stronger versus previous visits.  HENT:     Mouth/Throat:     Mouth: Mucous membranes are moist.     Pharynx: Oropharynx is clear. No oropharyngeal exudate or posterior oropharyngeal erythema.  Cardiovascular:     Rate and Rhythm: Normal rate and regular rhythm.     Heart sounds: No murmur heard.    No friction rub. No gallop.  Pulmonary:     Effort: Pulmonary effort is normal. No respiratory distress.     Breath sounds: Normal breath sounds. No wheezing, rhonchi or rales.   Abdominal:     General: Bowel sounds are normal. There is no distension.     Palpations: Abdomen is soft. There is no mass.     Tenderness: There is no abdominal tenderness.  Musculoskeletal:        General: No swelling.     Right lower leg: No edema.     Left lower leg: No edema.  Lymphadenopathy:     Cervical: No cervical adenopathy.     Upper Body:     Right upper body: No supraclavicular or axillary adenopathy.     Left upper body: No supraclavicular or axillary adenopathy.     Lower Body: No right inguinal adenopathy. No left inguinal adenopathy.  Skin:    General: Skin is warm.     Coloration: Skin is not jaundiced.     Findings: No lesion or rash.  Neurological:     General: No focal deficit present.     Mental Status: She is alert and oriented to person, place, and time. Mental status is at baseline.  Psychiatric:        Mood and Affect: Mood normal.        Behavior: Behavior normal.        Thought Content: Thought content normal.    LABS:    Latest Reference Range & Units 05/30/22 15:14  WBC 4.0 - 10.5 K/uL 9.5  RBC 3.87 - 5.11 MIL/uL 3.85 (L)  Hemoglobin  12.0 - 15.0 g/dL 8.9 (L)  HCT 36.0 - 46.0 % 30.1 (L)  MCV 80.0 - 100.0 fL 78.2 (L)  MCH 26.0 - 34.0 pg 23.1 (L)  MCHC 30.0 - 36.0 g/dL 29.6 (L)  RDW 11.5 - 15.5 % 27.0 (H)  Platelets 150 - 400 K/uL 549 (H)  nRBC 0.0 - 0.2 % 0.0  Neutrophils % 64  Lymphocytes % 15  Monocytes Relative % 12  Eosinophil % 7  Basophil % 1  Immature Granulocytes % 1  (L): Data is abnormally low (H): Data is abnormally high   Latest Reference Range & Units 05/30/22 15:14  Sodium 135 - 145 mmol/L 139  Potassium 3.5 - 5.1 mmol/L 3.8  Chloride 98 - 111 mmol/L 106  CO2 22 - 32 mmol/L 24  Glucose 70 - 99 mg/dL 100 (H)  BUN 8 - 23 mg/dL 24 (H)  Creatinine 0.44 - 1.00 mg/dL 1.32 (H)  Calcium 8.9 - 10.3 mg/dL 8.6 (L)  Anion gap 5 - 15  9  Alkaline Phosphatase 38 - 126 U/L 107  Albumin 3.5 - 5.0 g/dL 2.6 (L)  AST 15 - 41 U/L  28  ALT 0 - 44 U/L 32  Total Protein 6.5 - 8.1 g/dL 7.0  Total Bilirubin 0.3 - 1.2 mg/dL 0.5  GFR, Est Non African American >60 mL/min 40 (L)  (H): Data is abnormally high (L): Data is abnormally low  Latest Reference Range & Units 05/30/22 15:15  Iron 28 - 170 ug/dL 30  UIBC ug/dL 415  TIBC 250 - 450 ug/dL 445  Saturation Ratios 10.4 - 31.8 % 7 (L)  Ferritin 11 - 307 ng/mL 14  (L): Data is abnormally low  Latest Reference Range & Units Most Recent 03/31/22 15:31  M Protein SerPl Elph-Mcnc Not Observed g/dL 1.5 (H) (C) 05/30/22 15:14 1.6 (H) (C)  (H): Data is abnormally high (C): Corrected  Latest Reference Range & Units Most Recent 03/31/22 15:31  IgG (Immunoglobin G), Serum 586 - 1,602 mg/dL 1,856 (H) 05/30/22 15:14 2,265 (H)  (H): Data is abnormally high   Latest Reference Range & Units Most Recent 03/31/22 15:31  Lambda free light chains 5.7 - 26.3 mg/L 413.8 (H) 05/30/22 15:14 373.1 (H)  (H): Data is abnormally high  Latest Reference Range & Units Most Recent  Beta-2 Microglobulin 0.6 - 2.4 mg/L 5.6 (H) 04/28/22 11:35  (H): Data is abnormally high  Latest Reference Range & Units 04/28/22 11:35  Albumin 3.5 - 5.0 g/dL 2.8 (L)  (L): Data is abnormally low  PATHOLOGY:  Her bone marrow biopsy revealed the following: BONE MARROW, ASPIRATE, CLOT, CORE:  -Mildly hypercellular bone marrow with a lambda restricted plasmacytosis  of approximately 20% by immunohistochemistry, see note   PERIPHERAL BLOOD:  -Microcytic hypochromic anemia   Note: She has had a known iron deficiency anemia with a history of both  blood transfusions and iron infusion.  The current bone marrow stores  show adequate histiocytic iron stores; however, there is microcytosis  and apparent hypochromasia.   There is an increased lambda restricted plasma cell population  approximately 20% that aberrantly expresses CD56 consistent with a  plasma cell neoplasm this is in conjunction with a finding of a   monoclonal IgG lambda light chain restricted M protein of 1.6 g/dL by  SPEP/IFE which is also identified by kappa/lambda light chains with an  elevation of lambda free light chains of 373.1 mg/L and an inverted  kappa to lambda light chain ratio of 0.07.  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   Overall there is also hypercellularity with mild increase in both the  myeloid and erythroid lineages of uncertain clinical significance there  is no definitive evidence of dyspoiesis identified in this may simply be  reactive in nature; however, an early myeloid neoplasm such as  myelodysplastic syndrome cannot be completely excluded but is not  favored given the current clinical setting and findings.   MICROSCOPIC DESCRIPTION:   PERIPHERAL BLOOD SMEAR: The peripheral blood smear and indices are  reviewed revealing a microcytic, hypochromic anemia.  Upon morphologic  review there is evidence of central pallor to the red cells suggesting  iron deficiency in addition there are some spherocytes present.  There  is no evidence of rouleaux or circulating plasma cells.   BONE MARROW ASPIRATE: The bone marrow aspirate smear slides contain  several cellular bone marrow spicules which are adequate for  interpretation.  Erythroid precursors: The erythroid series is present in adequate to  increased number with a slight left shift.  There are subset of  moderate-sized cells with an intermediate amount of cytoplasm that is  deeply basophilic that is difficult to differentiate as plasma cells or  erythroid cells.  However there is not significant evidence for  erythroid dysplasia.  Granulocytic precursors: The myeloid series  present in adequate number and has orderly maturation and unremarkable  morphology.  Megakaryocytes: The megakaryocytes are present in adequate number and  have an  unremarkable morphology.  Lymphocytes/plasma cells: Plasma cells are present in increased number  and approximately 10% in a 500 cell count.  They are in general small  and mature in appearance however some have binucleation.  They have  deeply basophilic cytoplasm and some have limited cytoplasm making  definitive differentiation between some red cell precursors and plasma  cells difficult on the count and the overall percentage may actually be  higher than interpreted.  They are focally present in clusters.   TOUCH PREPARATIONS: While a touch prep is adequate for interpretation  the morphology is suboptimal compared to the aspirate, please see above   CLOT AND BIOPSY: The decalcified bone marrow biopsy consists of a  severely fragmented core of periosteum and subcortical bone but with a  hypercellular subcortical marrow of approximately 50%.  This  hypercellularity appears to be predominantly secondary to metaplastic  precursors with a slight left shift but otherwise orderly maturation  with an unremarkable morphology.  Plasma cells are not a prominent  aspect of the morphology on the HE stained slide   The clot section consists of significantly more metaphoric marrow for  evaluation and has a cellularity of approximately 50% which is  hypercellular for age.  While there is a mild panmyelosis there is  overall orderly maturation and unremarkable morphology to all 3  lineages.  Plasma cells are distinguishable on the clot section and  appear to have an increased interstitial involvement as well as focal  clustering.   IMMUNOHISTOCHEMICAL STAINS: A MUM1 and CD138 stain highlight increased  plasma cells in an interstitial pattern as well as small clusters at  approximately 20% of the cellular marrow, which may account for some of  the aforementioned identified hypercellularity.  The plasma cells  aberrantly express CD56 and are negative for cyclin D1 and CD117.  The  plasma cells are  lambda restricted by kappa/lambda ISH.   IRON STAIN: Iron stains are performed on a bone marrow  aspirate or touch  imprint smear and section of clot. The controls stained appropriately.        Storage Iron: Adequate histiocytic iron stores       Ring Sideroblasts: Not identified   ADDITIONAL DATA/TESTING: Conventional cytogenetics and FISH panel for  multiple myeloma is pending and will be reported in an addendum   CELL COUNT DATA:   Bone Marrow count performed on 500 cells shows:  Blasts:   0%   Myeloid:  40%  Promyelocytes: 0%   Erythroid:     34%  Myelocytes:    5%   Lymphocytes:   12%  Metamyelocytes:     2%   Plasma cells:  9%  Bands:    8%  Neutrophils:   20%  M:E ratio:     1.18  Eosinophils:   5%  Basophils:     0%  Monocytes:     5%   Cytogenetics:  ASSESSMENT & PLAN:  An 86 y.o. female with ISS stage III IgG lambda multiple myeloma.  When evaluating her myeloma parameters today, they collectively appear to be slightly better.  Her lambda light chain level has risen moderately; this will continue to be followed over time.  Overall, I see there being no need to change her therapy at this point.  She will proceed with her 2nd cycle of Revlimid/Decadron today.  When evaluating her other recent labs, it does appear that she is once again iron deficient.  She is already in the process of receiving a course of IV iron to replenish her iron stores and improve her hemoglobin.  I will see this patient back in 8 weeks, which will be the time before she heads into her 4th cycle of Revlimid/Decadron.  The patient understands all the plans discussed today and is in agreement with them.  Paz Winsett Macarthur Critchley, MD

## 2022-06-05 ENCOUNTER — Encounter: Payer: Self-pay | Admitting: Oncology

## 2022-06-05 ENCOUNTER — Inpatient Hospital Stay (INDEPENDENT_AMBULATORY_CARE_PROVIDER_SITE_OTHER): Payer: Medicare Other | Admitting: Oncology

## 2022-06-05 ENCOUNTER — Other Ambulatory Visit: Payer: Self-pay | Admitting: Oncology

## 2022-06-05 VITALS — BP 137/64 | HR 78 | Temp 97.6°F | Resp 20 | Ht 62.0 in | Wt 121.3 lb

## 2022-06-05 DIAGNOSIS — C9000 Multiple myeloma not having achieved remission: Secondary | ICD-10-CM

## 2022-06-05 MED FILL — Ferumoxytol Inj 510 MG/17ML (30 MG/ML) (Elemental Fe): INTRAVENOUS | Qty: 17 | Status: AC

## 2022-06-06 ENCOUNTER — Inpatient Hospital Stay: Payer: Medicare Other

## 2022-06-06 VITALS — BP 116/62 | HR 71 | Temp 98.0°F | Resp 18 | Wt 118.0 lb

## 2022-06-06 DIAGNOSIS — C9000 Multiple myeloma not having achieved remission: Secondary | ICD-10-CM | POA: Diagnosis not present

## 2022-06-06 DIAGNOSIS — D509 Iron deficiency anemia, unspecified: Secondary | ICD-10-CM | POA: Diagnosis not present

## 2022-06-06 DIAGNOSIS — Z79899 Other long term (current) drug therapy: Secondary | ICD-10-CM | POA: Diagnosis not present

## 2022-06-06 LAB — MULTIPLE MYELOMA PANEL, SERUM
Albumin SerPl Elph-Mcnc: 2.9 g/dL (ref 2.9–4.4)
Albumin/Glob SerPl: 0.8 (ref 0.7–1.7)
Alpha 1: 0.3 g/dL (ref 0.0–0.4)
Alpha2 Glob SerPl Elph-Mcnc: 0.7 g/dL (ref 0.4–1.0)
B-Globulin SerPl Elph-Mcnc: 0.9 g/dL (ref 0.7–1.3)
Gamma Glob SerPl Elph-Mcnc: 1.9 g/dL — ABNORMAL HIGH (ref 0.4–1.8)
Globulin, Total: 3.8 g/dL (ref 2.2–3.9)
IgA: 159 mg/dL (ref 64–422)
IgG (Immunoglobin G), Serum: 1856 mg/dL — ABNORMAL HIGH (ref 586–1602)
IgM (Immunoglobulin M), Srm: 151 mg/dL (ref 26–217)
M Protein SerPl Elph-Mcnc: 1.5 g/dL — ABNORMAL HIGH
Total Protein ELP: 6.7 g/dL (ref 6.0–8.5)

## 2022-06-06 MED ORDER — SODIUM CHLORIDE 0.9 % IV SOLN: Freq: Once | INTRAVENOUS | Status: AC

## 2022-06-06 MED ORDER — SODIUM CHLORIDE 0.9 % IV SOLN
510.0000 mg | Freq: Once | INTRAVENOUS | Status: AC
  Administered 2022-06-06: 510 mg via INTRAVENOUS
  Filled 2022-06-06: qty 510

## 2022-06-06 NOTE — Patient Instructions (Signed)
Ferumoxytol Injection What is this medication? FERUMOXYTOL (FER ue MOX i tol) treats low levels of iron in your body (iron deficiency anemia). Iron is a mineral that plays an important role in making red blood cells, which carry oxygen from your lungs to the rest of your body. This medicine may be used for other purposes; ask your health care provider or pharmacist if you have questions. COMMON BRAND NAME(S): Feraheme What should I tell my care team before I take this medication? They need to know if you have any of these conditions: Anemia not caused by low iron levels High levels of iron in the blood Magnetic resonance imaging (MRI) test scheduled An unusual or allergic reaction to iron, other medications, foods, dyes, or preservatives Pregnant or trying to get pregnant Breastfeeding How should I use this medication? This medication is injected into a vein. It is given by your care team in a hospital or clinic setting. Talk to your care team the use of this medication in children. Special care may be needed. Overdosage: If you think you have taken too much of this medicine contact a poison control center or emergency room at once. NOTE: This medicine is only for you. Do not share this medicine with others. What if I miss a dose? It is important not to miss your dose. Call your care team if you are unable to keep an appointment. What may interact with this medication? Other iron products This list may not describe all possible interactions. Give your health care provider a list of all the medicines, herbs, non-prescription drugs, or dietary supplements you use. Also tell them if you smoke, drink alcohol, or use illegal drugs. Some items may interact with your medicine. What should I watch for while using this medication? Visit your care team regularly. Tell your care team if your symptoms do not start to get better or if they get worse. You may need blood work done while you are taking this  medication. You may need to follow a special diet. Talk to your care team. Foods that contain iron include: whole grains/cereals, dried fruits, beans, or peas, leafy green vegetables, and organ meats (liver, kidney). What side effects may I notice from receiving this medication? Side effects that you should report to your care team as soon as possible: Allergic reactions--skin rash, itching, hives, swelling of the face, lips, tongue, or throat Low blood pressure--dizziness, feeling faint or lightheaded, blurry vision Shortness of breath Side effects that usually do not require medical attention (report to your care team if they continue or are bothersome): Flushing Headache Joint pain Muscle pain Nausea Pain, redness, or irritation at injection site This list may not describe all possible side effects. Call your doctor for medical advice about side effects. You may report side effects to FDA at 1-800-FDA-1088. Where should I keep my medication? This medication is given in a hospital or clinic and will not be stored at home. NOTE: This sheet is a summary. It may not cover all possible information. If you have questions about this medicine, talk to your doctor, pharmacist, or health care provider.  2023 Elsevier/Gold Standard (2020-10-31 00:00:00)

## 2022-06-08 ENCOUNTER — Encounter: Payer: Self-pay | Admitting: Oncology

## 2022-06-09 ENCOUNTER — Telehealth: Payer: Self-pay

## 2022-06-09 NOTE — Telephone Encounter (Signed)
I spoke with Stephanie Love. She states she is doing ok & hasn't really noticed any side effects from the Revlimid. Her voice remains hoarse. She is taking the Revlimid  in the evening with food. No missed dose. Pt reminded to call us if she were to develop temp of 100.4, day or night. She verbalized understanding.

## 2022-06-10 ENCOUNTER — Ambulatory Visit: Payer: Medicare Other | Admitting: Oncology

## 2022-06-10 ENCOUNTER — Other Ambulatory Visit: Payer: Medicare Other

## 2022-06-10 DIAGNOSIS — S81801A Unspecified open wound, right lower leg, initial encounter: Secondary | ICD-10-CM | POA: Diagnosis not present

## 2022-06-17 ENCOUNTER — Other Ambulatory Visit: Payer: Medicare Other

## 2022-06-17 ENCOUNTER — Ambulatory Visit: Payer: Medicare Other | Admitting: Oncology

## 2022-06-22 DIAGNOSIS — M7989 Other specified soft tissue disorders: Secondary | ICD-10-CM | POA: Diagnosis not present

## 2022-06-22 DIAGNOSIS — D649 Anemia, unspecified: Secondary | ICD-10-CM | POA: Diagnosis not present

## 2022-06-22 DIAGNOSIS — R9431 Abnormal electrocardiogram [ECG] [EKG]: Secondary | ICD-10-CM | POA: Diagnosis not present

## 2022-06-22 DIAGNOSIS — L03115 Cellulitis of right lower limb: Secondary | ICD-10-CM | POA: Diagnosis not present

## 2022-06-22 DIAGNOSIS — Z7982 Long term (current) use of aspirin: Secondary | ICD-10-CM | POA: Diagnosis not present

## 2022-06-22 DIAGNOSIS — D849 Immunodeficiency, unspecified: Secondary | ICD-10-CM | POA: Diagnosis not present

## 2022-06-22 DIAGNOSIS — E7800 Pure hypercholesterolemia, unspecified: Secondary | ICD-10-CM | POA: Diagnosis not present

## 2022-06-22 DIAGNOSIS — I10 Essential (primary) hypertension: Secondary | ICD-10-CM | POA: Diagnosis not present

## 2022-06-22 DIAGNOSIS — Z79899 Other long term (current) drug therapy: Secondary | ICD-10-CM | POA: Diagnosis not present

## 2022-06-22 DIAGNOSIS — I700 Atherosclerosis of aorta: Secondary | ICD-10-CM | POA: Diagnosis not present

## 2022-06-22 DIAGNOSIS — Z8673 Personal history of transient ischemic attack (TIA), and cerebral infarction without residual deficits: Secondary | ICD-10-CM | POA: Diagnosis not present

## 2022-06-22 DIAGNOSIS — C9000 Multiple myeloma not having achieved remission: Secondary | ICD-10-CM | POA: Diagnosis not present

## 2022-06-22 DIAGNOSIS — J439 Emphysema, unspecified: Secondary | ICD-10-CM | POA: Diagnosis not present

## 2022-06-22 DIAGNOSIS — I252 Old myocardial infarction: Secondary | ICD-10-CM | POA: Diagnosis not present

## 2022-06-23 DIAGNOSIS — D849 Immunodeficiency, unspecified: Secondary | ICD-10-CM | POA: Diagnosis not present

## 2022-06-23 DIAGNOSIS — C9000 Multiple myeloma not having achieved remission: Secondary | ICD-10-CM | POA: Diagnosis not present

## 2022-06-23 DIAGNOSIS — Z79899 Other long term (current) drug therapy: Secondary | ICD-10-CM | POA: Diagnosis not present

## 2022-06-23 DIAGNOSIS — J439 Emphysema, unspecified: Secondary | ICD-10-CM | POA: Diagnosis not present

## 2022-06-23 DIAGNOSIS — L03115 Cellulitis of right lower limb: Secondary | ICD-10-CM | POA: Diagnosis not present

## 2022-06-23 DIAGNOSIS — E7800 Pure hypercholesterolemia, unspecified: Secondary | ICD-10-CM | POA: Diagnosis not present

## 2022-06-23 DIAGNOSIS — Z8673 Personal history of transient ischemic attack (TIA), and cerebral infarction without residual deficits: Secondary | ICD-10-CM | POA: Diagnosis not present

## 2022-06-23 DIAGNOSIS — Z7982 Long term (current) use of aspirin: Secondary | ICD-10-CM | POA: Diagnosis not present

## 2022-06-23 DIAGNOSIS — I252 Old myocardial infarction: Secondary | ICD-10-CM | POA: Diagnosis not present

## 2022-06-23 DIAGNOSIS — I10 Essential (primary) hypertension: Secondary | ICD-10-CM | POA: Diagnosis not present

## 2022-06-25 ENCOUNTER — Telehealth: Payer: Self-pay

## 2022-06-25 ENCOUNTER — Other Ambulatory Visit: Payer: Self-pay | Admitting: Oncology

## 2022-06-25 DIAGNOSIS — Z7982 Long term (current) use of aspirin: Secondary | ICD-10-CM | POA: Diagnosis not present

## 2022-06-25 DIAGNOSIS — Z7952 Long term (current) use of systemic steroids: Secondary | ICD-10-CM | POA: Diagnosis not present

## 2022-06-25 DIAGNOSIS — K5790 Diverticulosis of intestine, part unspecified, without perforation or abscess without bleeding: Secondary | ICD-10-CM | POA: Diagnosis not present

## 2022-06-25 DIAGNOSIS — L03115 Cellulitis of right lower limb: Secondary | ICD-10-CM | POA: Diagnosis not present

## 2022-06-25 DIAGNOSIS — D509 Iron deficiency anemia, unspecified: Secondary | ICD-10-CM | POA: Diagnosis not present

## 2022-06-25 DIAGNOSIS — K219 Gastro-esophageal reflux disease without esophagitis: Secondary | ICD-10-CM | POA: Diagnosis not present

## 2022-06-25 DIAGNOSIS — D849 Immunodeficiency, unspecified: Secondary | ICD-10-CM | POA: Diagnosis not present

## 2022-06-25 DIAGNOSIS — E039 Hypothyroidism, unspecified: Secondary | ICD-10-CM | POA: Diagnosis not present

## 2022-06-25 DIAGNOSIS — I69354 Hemiplegia and hemiparesis following cerebral infarction affecting left non-dominant side: Secondary | ICD-10-CM | POA: Diagnosis not present

## 2022-06-25 DIAGNOSIS — J439 Emphysema, unspecified: Secondary | ICD-10-CM | POA: Diagnosis not present

## 2022-06-25 DIAGNOSIS — C9000 Multiple myeloma not having achieved remission: Secondary | ICD-10-CM

## 2022-06-25 DIAGNOSIS — E7800 Pure hypercholesterolemia, unspecified: Secondary | ICD-10-CM | POA: Diagnosis not present

## 2022-06-25 DIAGNOSIS — I10 Essential (primary) hypertension: Secondary | ICD-10-CM | POA: Diagnosis not present

## 2022-06-25 DIAGNOSIS — F32A Depression, unspecified: Secondary | ICD-10-CM | POA: Diagnosis not present

## 2022-06-25 DIAGNOSIS — J449 Chronic obstructive pulmonary disease, unspecified: Secondary | ICD-10-CM | POA: Diagnosis not present

## 2022-06-25 DIAGNOSIS — Z792 Long term (current) use of antibiotics: Secondary | ICD-10-CM | POA: Diagnosis not present

## 2022-06-25 DIAGNOSIS — M150 Primary generalized (osteo)arthritis: Secondary | ICD-10-CM | POA: Diagnosis not present

## 2022-06-25 DIAGNOSIS — I252 Old myocardial infarction: Secondary | ICD-10-CM | POA: Diagnosis not present

## 2022-06-25 DIAGNOSIS — Z87891 Personal history of nicotine dependence: Secondary | ICD-10-CM | POA: Diagnosis not present

## 2022-06-25 NOTE — Telephone Encounter (Signed)
Courtney from [facility] called patient needs revlimid refill '10mg'$  x 21 days then off 7 days

## 2022-06-25 NOTE — Progress Notes (Signed)
[facility]  [address] [place],  [place]  [address] [phone]  Clinic Day:  06/05/2022  Referring physician: Carvel Getting Key, *  HISTORY OF PRESENT ILLNESS:  The patient is a 87 y.o. female with ISS stage III IgG lambda multiple myeloma.  This was proven per a recent bone marrow biopsy done in late October 2023.  She currently takes Revlimid/Decadron.  She comes in today after just being discharged from the hospital for cellulitis, which required IV antibiotics.  The patient claims to be doing okay.  She denies having any problems with respect to tolerating her myeloma therapy.  She denies having increased fatigue, bone pain, or other symptoms which concern for progression of her multiple myeloma.  Of note, this patient also has a history iron deficiency anemia related to AVMs and esophageal varices secondary to NASH cirrhosis.  PHYSICAL EXAM:  Blood pressure (!) 126/59, pulse 65, temperature 97.6 F (36.4 C), resp. rate 16, height _0  (1.575 m), weight 127 lb 11.2 oz (57.9 kg), SpO2 96 %. Wt Readings from Last 3 Encounters:  06/26/22 127 lb 11.2 oz (57.9 kg)  06/06/22 118 lb (53.5 kg)  06/05/22 121 lb 4.8 oz (55 kg)   Body mass index is 23.36 kg/m. Performance status (ECOG): 1 - Symptomatic but completely ambulatory Physical Exam Constitutional:      Appearance: Normal appearance. She is not ill-appearing.     Comments: She has a chronically ill appearance   HENT:     Mouth/Throat:     Mouth: Mucous membranes are moist.     Pharynx: Oropharynx is clear. No oropharyngeal exudate or posterior oropharyngeal erythema.  Cardiovascular:     Rate and Rhythm: Normal rate and regular rhythm.     Heart sounds: No murmur heard.    No friction rub. No gallop.  Pulmonary:     Effort: Pulmonary effort is normal. No respiratory distress.     Breath sounds: Normal breath sounds. No wheezing, rhonchi or rales.  Abdominal:     General: Bowel  sounds are normal. There is no distension.     Palpations: Abdomen is soft. There is no mass.     Tenderness: There is no abdominal tenderness.  Musculoskeletal:        General: No swelling.     Right lower leg: No edema.     Left lower leg: No edema.  Lymphadenopathy:     Cervical: No cervical adenopathy.     Upper Body:     Right upper body: No supraclavicular or axillary adenopathy.     Left upper body: No supraclavicular or axillary adenopathy.     Lower Body: No right inguinal adenopathy. No left inguinal adenopathy.  Skin:    General: Skin is warm.     Coloration: Skin is not jaundiced.     Findings: No lesion or rash.  Neurological:     General: No focal deficit present.     Mental Status: She is alert and oriented to person, place, and time. Mental status is at baseline.  Psychiatric:        Mood and Affect: Mood normal.        Behavior: Behavior normal.        Thought Content: Thought content normal.    LABS:    PATHOLOGY:  Her bone marrow biopsy revealed the following: BONE MARROW, ASPIRATE, CLOT, CORE:  -Mildly hypercellular bone marrow with a lambda restricted plasmacytosis  of approximately 20% by immunohistochemistry, see  note   PERIPHERAL BLOOD:  -Microcytic hypochromic anemia   Note: She has had a known iron deficiency anemia with a history of both  blood transfusions and iron infusion.  The current bone marrow stores  show adequate histiocytic iron stores; however, there is microcytosis  and apparent hypochromasia.   There is an increased lambda restricted plasma cell population  approximately 20% that aberrantly expresses CD56 consistent with a  plasma cell neoplasm this is in conjunction with a finding of a  monoclonal IgG lambda light chain restricted M protein of 1.6 g/dL by  SPEP/IFE which is also identified by kappa/lambda light chains with an  elevation of lambda free light chains of 373.1 mg/L and an inverted  kappa to lambda light chain ratio  of 0.07.   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   Overall there is also hypercellularity with mild increase in both the  myeloid and erythroid lineages of uncertain clinical significance there  is no definitive evidence of dyspoiesis identified in this may simply be  reactive in nature; however, an early myeloid neoplasm such as  myelodysplastic syndrome cannot be completely excluded but is not  favored given the current clinical setting and findings.   MICROSCOPIC DESCRIPTION:   PERIPHERAL BLOOD SMEAR: The peripheral blood smear and indices are  reviewed revealing a microcytic, hypochromic anemia.  Upon morphologic  review there is evidence of central pallor to the red cells suggesting  iron deficiency in addition there are some spherocytes present.  There  is no evidence of rouleaux or circulating plasma cells.   BONE MARROW ASPIRATE: The bone marrow aspirate smear slides contain  several cellular bone marrow spicules which are adequate for  interpretation.  Erythroid precursors: The erythroid series is present in adequate to  increased number with a slight left shift.  There are subset of  moderate-sized cells with an intermediate amount of cytoplasm that is  deeply basophilic that is difficult to differentiate as plasma cells or  erythroid cells.  However there is not significant evidence for  erythroid dysplasia.  Granulocytic precursors: The myeloid series  present in adequate number and has orderly maturation and unremarkable  morphology.  Megakaryocytes: The megakaryocytes are present in adequate number and  have an unremarkable morphology.  Lymphocytes/plasma cells: Plasma cells are present in increased number  and approximately 10% in a 500 cell count.  They are in general small  and mature in appearance however some have binucleation.  They have  deeply  basophilic cytoplasm and some have limited cytoplasm making  definitive differentiation between some red cell precursors and plasma  cells difficult on the count and the overall percentage may actually be  higher than interpreted.  They are focally present in clusters.   TOUCH PREPARATIONS: While a touch prep is adequate for interpretation  the morphology is suboptimal compared to the aspirate, please see above   CLOT AND BIOPSY: The decalcified bone marrow biopsy consists of a  severely fragmented core of periosteum and subcortical bone but with a  hypercellular subcortical marrow of approximately 50%.  This  hypercellularity appears to be predominantly secondary to metaplastic  precursors with a slight left shift but otherwise orderly maturation  with an unremarkable morphology.  Plasma cells are not a prominent  aspect of the morphology on the HE stained slide   The clot section consists of significantly more metaphoric marrow for  evaluation and has a cellularity of  approximately 50% which is  hypercellular for age.  While there is a mild panmyelosis there is  overall orderly maturation and unremarkable morphology to all 3  lineages.  Plasma cells are distinguishable on the clot section and  appear to have an increased interstitial involvement as well as focal  clustering.   IMMUNOHISTOCHEMICAL STAINS: A MUM1 and CD138 stain highlight increased  plasma cells in an interstitial pattern as well as small clusters at  approximately 20% of the cellular marrow, which may account for some of  the aforementioned identified hypercellularity.  The plasma cells  aberrantly express CD56 and are negative for cyclin D1 and CD117.  The  plasma cells are lambda restricted by kappa/lambda ISH.   IRON STAIN: Iron stains are performed on a bone marrow aspirate or touch  imprint smear and section of clot. The controls stained appropriately.        Storage Iron: Adequate histiocytic iron stores        Ring Sideroblasts: Not identified   ADDITIONAL DATA/TESTING: Conventional cytogenetics and FISH panel for  multiple myeloma is pending and will be reported in an addendum   CELL COUNT DATA:   Bone Marrow count performed on 500 cells shows:  Blasts:   0%   Myeloid:  40%  Promyelocytes: 0%   Erythroid:     34%  Myelocytes:    5%   Lymphocytes:   12%  Metamyelocytes:     2%   Plasma cells:  9%  Bands:    8%  Neutrophils:   20%  M:E ratio:     1.18  Eosinophils:   5%  Basophils:     0%  Monocytes:     5%   Cytogenetics:  ASSESSMENT & PLAN:  An 87 y.o. female with ISS stage III IgG lambda multiple myeloma.  As mentioned previously, the patient is here today essentially as a post-hospitalization visit.  From a myeloma standpoint, she knows to stay on her Revlimid/Decadron.  She has numerous other comorbidities that are impacting her daily quality of life.  She knows to work with her other physicians with respect to getting these health issues corrected as best as possible.  Otherwise, I will see this patient back per a previously scheduled appointment, which will be the time before she heads into her 4th cycle of Revlimid/Decadron.  The patient understands all the plans discussed today and is in agreement with them.  Ellon Marasco Macarthur Critchley, MD

## 2022-06-26 ENCOUNTER — Other Ambulatory Visit (HOSPITAL_COMMUNITY): Payer: Self-pay

## 2022-06-26 ENCOUNTER — Telehealth: Payer: Self-pay | Admitting: Oncology

## 2022-06-26 ENCOUNTER — Telehealth: Payer: Self-pay

## 2022-06-26 ENCOUNTER — Inpatient Hospital Stay: Payer: Medicare Other | Attending: Oncology | Admitting: Oncology

## 2022-06-26 VITALS — BP 126/59 | HR 65 | Temp 97.6°F | Resp 16 | Ht 62.0 in | Wt 127.7 lb

## 2022-06-26 DIAGNOSIS — C9000 Multiple myeloma not having achieved remission: Secondary | ICD-10-CM | POA: Diagnosis not present

## 2022-06-26 NOTE — Telephone Encounter (Addendum)
Pt given the # [phone] to call [facility] & schedule her next Revlimid shipment. I also sent message to Verner Chol for Dr Bobby Rumpf to obtain a [employer] authorization #.   Patrice from [facility] confirmed they have the [employer] # and are planning shipment.

## 2022-06-26 NOTE — Telephone Encounter (Signed)
06/26/22 Next appt scheduled and confirmed with patient 

## 2022-06-30 ENCOUNTER — Ambulatory Visit (INDEPENDENT_AMBULATORY_CARE_PROVIDER_SITE_OTHER): Payer: Medicare Other

## 2022-06-30 DIAGNOSIS — I63119 Cerebral infarction due to embolism of unspecified vertebral artery: Secondary | ICD-10-CM

## 2022-07-01 LAB — CUP PACEART REMOTE DEVICE CHECK
Date Time Interrogation Session: 20240107231351
Implantable Pulse Generator Implant Date: 20200526

## 2022-07-03 DIAGNOSIS — I69354 Hemiplegia and hemiparesis following cerebral infarction affecting left non-dominant side: Secondary | ICD-10-CM | POA: Diagnosis not present

## 2022-07-03 DIAGNOSIS — K219 Gastro-esophageal reflux disease without esophagitis: Secondary | ICD-10-CM | POA: Diagnosis not present

## 2022-07-03 DIAGNOSIS — J449 Chronic obstructive pulmonary disease, unspecified: Secondary | ICD-10-CM | POA: Diagnosis not present

## 2022-07-03 DIAGNOSIS — I10 Essential (primary) hypertension: Secondary | ICD-10-CM | POA: Diagnosis not present

## 2022-07-03 DIAGNOSIS — D509 Iron deficiency anemia, unspecified: Secondary | ICD-10-CM | POA: Diagnosis not present

## 2022-07-03 DIAGNOSIS — L03115 Cellulitis of right lower limb: Secondary | ICD-10-CM | POA: Diagnosis not present

## 2022-07-04 DIAGNOSIS — C9000 Multiple myeloma not having achieved remission: Secondary | ICD-10-CM | POA: Diagnosis not present

## 2022-07-04 DIAGNOSIS — J449 Chronic obstructive pulmonary disease, unspecified: Secondary | ICD-10-CM | POA: Diagnosis not present

## 2022-07-04 DIAGNOSIS — Z6824 Body mass index (BMI) 24.0-24.9, adult: Secondary | ICD-10-CM | POA: Diagnosis not present

## 2022-07-04 DIAGNOSIS — K7460 Unspecified cirrhosis of liver: Secondary | ICD-10-CM | POA: Diagnosis not present

## 2022-07-04 NOTE — Progress Notes (Signed)
[facility] Summary Report / Loop Recorder

## 2022-07-05 DIAGNOSIS — M79661 Pain in right lower leg: Secondary | ICD-10-CM | POA: Diagnosis not present

## 2022-07-05 DIAGNOSIS — L03113 Cellulitis of right upper limb: Secondary | ICD-10-CM | POA: Diagnosis not present

## 2022-07-05 DIAGNOSIS — F32A Depression, unspecified: Secondary | ICD-10-CM | POA: Diagnosis not present

## 2022-07-05 DIAGNOSIS — I129 Hypertensive chronic kidney disease with stage 1 through stage 4 chronic kidney disease, or unspecified chronic kidney disease: Secondary | ICD-10-CM | POA: Diagnosis not present

## 2022-07-05 DIAGNOSIS — Z7982 Long term (current) use of aspirin: Secondary | ICD-10-CM | POA: Diagnosis not present

## 2022-07-05 DIAGNOSIS — L03116 Cellulitis of left lower limb: Secondary | ICD-10-CM | POA: Diagnosis not present

## 2022-07-05 DIAGNOSIS — R531 Weakness: Secondary | ICD-10-CM | POA: Diagnosis not present

## 2022-07-05 DIAGNOSIS — E7800 Pure hypercholesterolemia, unspecified: Secondary | ICD-10-CM | POA: Diagnosis not present

## 2022-07-05 DIAGNOSIS — R600 Localized edema: Secondary | ICD-10-CM | POA: Diagnosis not present

## 2022-07-05 DIAGNOSIS — F419 Anxiety disorder, unspecified: Secondary | ICD-10-CM | POA: Diagnosis not present

## 2022-07-05 DIAGNOSIS — N170 Acute kidney failure with tubular necrosis: Secondary | ICD-10-CM | POA: Diagnosis not present

## 2022-07-05 DIAGNOSIS — Z87891 Personal history of nicotine dependence: Secondary | ICD-10-CM | POA: Diagnosis not present

## 2022-07-05 DIAGNOSIS — Z79899 Other long term (current) drug therapy: Secondary | ICD-10-CM | POA: Diagnosis not present

## 2022-07-05 DIAGNOSIS — A419 Sepsis, unspecified organism: Secondary | ICD-10-CM | POA: Diagnosis not present

## 2022-07-05 DIAGNOSIS — C9000 Multiple myeloma not having achieved remission: Secondary | ICD-10-CM | POA: Diagnosis not present

## 2022-07-05 DIAGNOSIS — E876 Hypokalemia: Secondary | ICD-10-CM | POA: Diagnosis not present

## 2022-07-05 DIAGNOSIS — Z8673 Personal history of transient ischemic attack (TIA), and cerebral infarction without residual deficits: Secondary | ICD-10-CM | POA: Diagnosis not present

## 2022-07-05 DIAGNOSIS — E785 Hyperlipidemia, unspecified: Secondary | ICD-10-CM | POA: Diagnosis not present

## 2022-07-05 DIAGNOSIS — Z87440 Personal history of urinary (tract) infections: Secondary | ICD-10-CM | POA: Diagnosis not present

## 2022-07-05 DIAGNOSIS — R1311 Dysphagia, oral phase: Secondary | ICD-10-CM | POA: Diagnosis not present

## 2022-07-05 DIAGNOSIS — B370 Candidal stomatitis: Secondary | ICD-10-CM | POA: Diagnosis not present

## 2022-07-05 DIAGNOSIS — J449 Chronic obstructive pulmonary disease, unspecified: Secondary | ICD-10-CM | POA: Diagnosis not present

## 2022-07-05 DIAGNOSIS — K754 Autoimmune hepatitis: Secondary | ICD-10-CM | POA: Diagnosis not present

## 2022-07-05 DIAGNOSIS — I252 Old myocardial infarction: Secondary | ICD-10-CM | POA: Diagnosis not present

## 2022-07-05 DIAGNOSIS — Z7401 Bed confinement status: Secondary | ICD-10-CM | POA: Diagnosis not present

## 2022-07-05 DIAGNOSIS — M6281 Muscle weakness (generalized): Secondary | ICD-10-CM | POA: Diagnosis not present

## 2022-07-05 DIAGNOSIS — K7460 Unspecified cirrhosis of liver: Secondary | ICD-10-CM | POA: Diagnosis not present

## 2022-07-05 DIAGNOSIS — F329 Major depressive disorder, single episode, unspecified: Secondary | ICD-10-CM | POA: Diagnosis not present

## 2022-07-05 DIAGNOSIS — R279 Unspecified lack of coordination: Secondary | ICD-10-CM | POA: Diagnosis not present

## 2022-07-05 DIAGNOSIS — I700 Atherosclerosis of aorta: Secondary | ICD-10-CM | POA: Diagnosis not present

## 2022-07-05 DIAGNOSIS — D649 Anemia, unspecified: Secondary | ICD-10-CM | POA: Diagnosis not present

## 2022-07-05 DIAGNOSIS — E43 Unspecified severe protein-calorie malnutrition: Secondary | ICD-10-CM | POA: Diagnosis not present

## 2022-07-05 DIAGNOSIS — L03115 Cellulitis of right lower limb: Secondary | ICD-10-CM | POA: Diagnosis not present

## 2022-07-05 DIAGNOSIS — I2510 Atherosclerotic heart disease of native coronary artery without angina pectoris: Secondary | ICD-10-CM | POA: Diagnosis not present

## 2022-07-05 DIAGNOSIS — I959 Hypotension, unspecified: Secondary | ICD-10-CM | POA: Diagnosis not present

## 2022-07-05 DIAGNOSIS — L97911 Non-pressure chronic ulcer of unspecified part of right lower leg limited to breakdown of skin: Secondary | ICD-10-CM | POA: Diagnosis not present

## 2022-07-05 DIAGNOSIS — D61818 Other pancytopenia: Secondary | ICD-10-CM | POA: Diagnosis not present

## 2022-07-05 DIAGNOSIS — M1990 Unspecified osteoarthritis, unspecified site: Secondary | ICD-10-CM | POA: Diagnosis not present

## 2022-07-05 DIAGNOSIS — K439 Ventral hernia without obstruction or gangrene: Secondary | ICD-10-CM | POA: Diagnosis not present

## 2022-07-05 DIAGNOSIS — Z880 Allergy status to penicillin: Secondary | ICD-10-CM | POA: Diagnosis not present

## 2022-07-05 DIAGNOSIS — R4781 Slurred speech: Secondary | ICD-10-CM | POA: Diagnosis not present

## 2022-07-05 DIAGNOSIS — E039 Hypothyroidism, unspecified: Secondary | ICD-10-CM | POA: Diagnosis not present

## 2022-07-05 DIAGNOSIS — J439 Emphysema, unspecified: Secondary | ICD-10-CM | POA: Diagnosis not present

## 2022-07-05 DIAGNOSIS — L97919 Non-pressure chronic ulcer of unspecified part of right lower leg with unspecified severity: Secondary | ICD-10-CM | POA: Diagnosis not present

## 2022-07-05 DIAGNOSIS — J90 Pleural effusion, not elsewhere classified: Secondary | ICD-10-CM | POA: Diagnosis not present

## 2022-07-05 DIAGNOSIS — N183 Chronic kidney disease, stage 3 unspecified: Secondary | ICD-10-CM | POA: Diagnosis not present

## 2022-07-05 DIAGNOSIS — T148XXA Other injury of unspecified body region, initial encounter: Secondary | ICD-10-CM | POA: Diagnosis not present

## 2022-07-05 DIAGNOSIS — I10 Essential (primary) hypertension: Secondary | ICD-10-CM | POA: Diagnosis not present

## 2022-07-05 DIAGNOSIS — F339 Major depressive disorder, recurrent, unspecified: Secondary | ICD-10-CM | POA: Diagnosis not present

## 2022-07-05 DIAGNOSIS — R6510 Systemic inflammatory response syndrome (SIRS) of non-infectious origin without acute organ dysfunction: Secondary | ICD-10-CM | POA: Diagnosis not present

## 2022-07-05 DIAGNOSIS — M79604 Pain in right leg: Secondary | ICD-10-CM | POA: Diagnosis not present

## 2022-07-05 DIAGNOSIS — R109 Unspecified abdominal pain: Secondary | ICD-10-CM | POA: Diagnosis not present

## 2022-07-05 DIAGNOSIS — K219 Gastro-esophageal reflux disease without esophagitis: Secondary | ICD-10-CM | POA: Diagnosis not present

## 2022-07-05 DIAGNOSIS — N179 Acute kidney failure, unspecified: Secondary | ICD-10-CM | POA: Diagnosis not present

## 2022-07-05 DIAGNOSIS — R2689 Other abnormalities of gait and mobility: Secondary | ICD-10-CM | POA: Diagnosis not present

## 2022-07-05 DIAGNOSIS — N1831 Chronic kidney disease, stage 3a: Secondary | ICD-10-CM | POA: Diagnosis not present

## 2022-07-06 DIAGNOSIS — R109 Unspecified abdominal pain: Secondary | ICD-10-CM | POA: Diagnosis not present

## 2022-07-06 DIAGNOSIS — I700 Atherosclerosis of aorta: Secondary | ICD-10-CM | POA: Diagnosis not present

## 2022-07-08 ENCOUNTER — Telehealth: Payer: Self-pay

## 2022-07-08 NOTE — Telephone Encounter (Addendum)
I attempted call to Stephanie Love to see how she is tolerating the Revlimid, no answer. 07/10/2022 I spoke with Stephanie Love. She informed she is currently in the hospital @ [facility]. She was admitted over the past weekend with right lower extremity cellulitis. "They didn't think I was going to make it". Stephanie Love stated prior to hospitalization she was taking her Revlimid in the morning w/food. No missed doses @ home. She is unsure if she has missed any while in hospital. I reviewed her [employer] MAR from today and she was given the Revlimid this morning (18th). Stephanie Love has required 1 unit blood since IP. Plan is for discharge to SNF, as Stephanie Love lives alone.

## 2022-07-10 ENCOUNTER — Encounter: Payer: Self-pay | Admitting: Oncology

## 2022-07-16 ENCOUNTER — Encounter: Payer: Self-pay | Admitting: Oncology

## 2022-07-17 DIAGNOSIS — N1831 Chronic kidney disease, stage 3a: Secondary | ICD-10-CM | POA: Diagnosis not present

## 2022-07-17 DIAGNOSIS — F339 Major depressive disorder, recurrent, unspecified: Secondary | ICD-10-CM | POA: Diagnosis not present

## 2022-07-17 DIAGNOSIS — R1311 Dysphagia, oral phase: Secondary | ICD-10-CM | POA: Diagnosis not present

## 2022-07-17 DIAGNOSIS — N183 Chronic kidney disease, stage 3 unspecified: Secondary | ICD-10-CM | POA: Diagnosis not present

## 2022-07-17 DIAGNOSIS — M726 Necrotizing fasciitis: Secondary | ICD-10-CM | POA: Diagnosis not present

## 2022-07-17 DIAGNOSIS — B965 Pseudomonas (aeruginosa) (mallei) (pseudomallei) as the cause of diseases classified elsewhere: Secondary | ICD-10-CM | POA: Diagnosis not present

## 2022-07-17 DIAGNOSIS — R5381 Other malaise: Secondary | ICD-10-CM | POA: Diagnosis not present

## 2022-07-17 DIAGNOSIS — C9000 Multiple myeloma not having achieved remission: Secondary | ICD-10-CM | POA: Diagnosis not present

## 2022-07-17 DIAGNOSIS — N179 Acute kidney failure, unspecified: Secondary | ICD-10-CM | POA: Diagnosis not present

## 2022-07-17 DIAGNOSIS — F419 Anxiety disorder, unspecified: Secondary | ICD-10-CM | POA: Diagnosis not present

## 2022-07-17 DIAGNOSIS — D62 Acute posthemorrhagic anemia: Secondary | ICD-10-CM | POA: Diagnosis not present

## 2022-07-17 DIAGNOSIS — R238 Other skin changes: Secondary | ICD-10-CM | POA: Diagnosis not present

## 2022-07-17 DIAGNOSIS — R52 Pain, unspecified: Secondary | ICD-10-CM | POA: Diagnosis not present

## 2022-07-17 DIAGNOSIS — I959 Hypotension, unspecified: Secondary | ICD-10-CM | POA: Diagnosis not present

## 2022-07-17 DIAGNOSIS — N170 Acute kidney failure with tubular necrosis: Secondary | ICD-10-CM | POA: Diagnosis not present

## 2022-07-17 DIAGNOSIS — Z7401 Bed confinement status: Secondary | ICD-10-CM | POA: Diagnosis not present

## 2022-07-17 DIAGNOSIS — E785 Hyperlipidemia, unspecified: Secondary | ICD-10-CM | POA: Diagnosis not present

## 2022-07-17 DIAGNOSIS — R279 Unspecified lack of coordination: Secondary | ICD-10-CM | POA: Diagnosis not present

## 2022-07-17 DIAGNOSIS — I10 Essential (primary) hypertension: Secondary | ICD-10-CM | POA: Diagnosis not present

## 2022-07-17 DIAGNOSIS — R2689 Other abnormalities of gait and mobility: Secondary | ICD-10-CM | POA: Diagnosis not present

## 2022-07-17 DIAGNOSIS — K219 Gastro-esophageal reflux disease without esophagitis: Secondary | ICD-10-CM | POA: Diagnosis not present

## 2022-07-17 DIAGNOSIS — L89892 Pressure ulcer of other site, stage 2: Secondary | ICD-10-CM | POA: Diagnosis not present

## 2022-07-17 DIAGNOSIS — F329 Major depressive disorder, single episode, unspecified: Secondary | ICD-10-CM | POA: Diagnosis not present

## 2022-07-17 DIAGNOSIS — R739 Hyperglycemia, unspecified: Secondary | ICD-10-CM | POA: Diagnosis not present

## 2022-07-17 DIAGNOSIS — Y998 Other external cause status: Secondary | ICD-10-CM | POA: Diagnosis not present

## 2022-07-17 DIAGNOSIS — I129 Hypertensive chronic kidney disease with stage 1 through stage 4 chronic kidney disease, or unspecified chronic kidney disease: Secondary | ICD-10-CM | POA: Diagnosis not present

## 2022-07-17 DIAGNOSIS — L89312 Pressure ulcer of right buttock, stage 2: Secondary | ICD-10-CM | POA: Diagnosis not present

## 2022-07-17 DIAGNOSIS — E441 Mild protein-calorie malnutrition: Secondary | ICD-10-CM | POA: Diagnosis not present

## 2022-07-17 DIAGNOSIS — E8809 Other disorders of plasma-protein metabolism, not elsewhere classified: Secondary | ICD-10-CM | POA: Diagnosis not present

## 2022-07-17 DIAGNOSIS — L89322 Pressure ulcer of left buttock, stage 2: Secondary | ICD-10-CM | POA: Diagnosis not present

## 2022-07-17 DIAGNOSIS — F32A Depression, unspecified: Secondary | ICD-10-CM | POA: Diagnosis not present

## 2022-07-17 DIAGNOSIS — Z8673 Personal history of transient ischemic attack (TIA), and cerebral infarction without residual deficits: Secondary | ICD-10-CM | POA: Diagnosis not present

## 2022-07-17 DIAGNOSIS — R29898 Other symptoms and signs involving the musculoskeletal system: Secondary | ICD-10-CM | POA: Diagnosis not present

## 2022-07-17 DIAGNOSIS — S81801A Unspecified open wound, right lower leg, initial encounter: Secondary | ICD-10-CM | POA: Diagnosis not present

## 2022-07-17 DIAGNOSIS — J449 Chronic obstructive pulmonary disease, unspecified: Secondary | ICD-10-CM | POA: Diagnosis not present

## 2022-07-17 DIAGNOSIS — R600 Localized edema: Secondary | ICD-10-CM | POA: Diagnosis not present

## 2022-07-17 DIAGNOSIS — D61818 Other pancytopenia: Secondary | ICD-10-CM | POA: Diagnosis not present

## 2022-07-17 DIAGNOSIS — I96 Gangrene, not elsewhere classified: Secondary | ICD-10-CM | POA: Diagnosis not present

## 2022-07-17 DIAGNOSIS — E43 Unspecified severe protein-calorie malnutrition: Secondary | ICD-10-CM | POA: Diagnosis not present

## 2022-07-17 DIAGNOSIS — I2510 Atherosclerotic heart disease of native coronary artery without angina pectoris: Secondary | ICD-10-CM | POA: Diagnosis not present

## 2022-07-17 DIAGNOSIS — N182 Chronic kidney disease, stage 2 (mild): Secondary | ICD-10-CM | POA: Diagnosis not present

## 2022-07-17 DIAGNOSIS — W1839XA Other fall on same level, initial encounter: Secondary | ICD-10-CM | POA: Diagnosis not present

## 2022-07-17 DIAGNOSIS — E876 Hypokalemia: Secondary | ICD-10-CM | POA: Diagnosis not present

## 2022-07-17 DIAGNOSIS — L03115 Cellulitis of right lower limb: Secondary | ICD-10-CM | POA: Diagnosis not present

## 2022-07-17 DIAGNOSIS — B370 Candidal stomatitis: Secondary | ICD-10-CM | POA: Diagnosis not present

## 2022-07-17 DIAGNOSIS — D509 Iron deficiency anemia, unspecified: Secondary | ICD-10-CM | POA: Diagnosis not present

## 2022-07-17 DIAGNOSIS — K754 Autoimmune hepatitis: Secondary | ICD-10-CM | POA: Diagnosis not present

## 2022-07-17 DIAGNOSIS — L03116 Cellulitis of left lower limb: Secondary | ICD-10-CM | POA: Diagnosis not present

## 2022-07-17 DIAGNOSIS — E039 Hypothyroidism, unspecified: Secondary | ICD-10-CM | POA: Diagnosis not present

## 2022-07-17 DIAGNOSIS — I890 Lymphedema, not elsewhere classified: Secondary | ICD-10-CM | POA: Diagnosis not present

## 2022-07-17 DIAGNOSIS — J9811 Atelectasis: Secondary | ICD-10-CM | POA: Diagnosis not present

## 2022-07-17 DIAGNOSIS — M6281 Muscle weakness (generalized): Secondary | ICD-10-CM | POA: Diagnosis not present

## 2022-07-17 DIAGNOSIS — Z66 Do not resuscitate: Secondary | ICD-10-CM | POA: Diagnosis not present

## 2022-07-17 DIAGNOSIS — M1990 Unspecified osteoarthritis, unspecified site: Secondary | ICD-10-CM | POA: Diagnosis not present

## 2022-07-17 DIAGNOSIS — K7460 Unspecified cirrhosis of liver: Secondary | ICD-10-CM | POA: Diagnosis not present

## 2022-07-18 ENCOUNTER — Telehealth: Payer: Self-pay | Admitting: Oncology

## 2022-07-18 NOTE — Telephone Encounter (Signed)
07/18/22 Per [facility](Debbie)appts cancelled.Facility will call back to reschedule

## 2022-07-19 DIAGNOSIS — E441 Mild protein-calorie malnutrition: Secondary | ICD-10-CM | POA: Diagnosis not present

## 2022-07-19 DIAGNOSIS — R5381 Other malaise: Secondary | ICD-10-CM | POA: Diagnosis not present

## 2022-07-19 DIAGNOSIS — N1831 Chronic kidney disease, stage 3a: Secondary | ICD-10-CM | POA: Diagnosis not present

## 2022-07-19 DIAGNOSIS — I2510 Atherosclerotic heart disease of native coronary artery without angina pectoris: Secondary | ICD-10-CM | POA: Diagnosis not present

## 2022-07-21 ENCOUNTER — Other Ambulatory Visit: Payer: Medicare Other

## 2022-07-22 ENCOUNTER — Other Ambulatory Visit: Payer: Self-pay | Admitting: *Deleted

## 2022-07-22 NOTE — Patient Outreach (Signed)
Mrs. Montoya resides in [facility]. Screening for potential [facility] care coordination services as benefit of health plan and Primary Care Provider.   Collaboration with Janett Billow, Conservation officer, nature, earlier today. Mrs. Petraglia recently admitted. Has painful leg wound. Has scheduled MD appointment tomorrow. Will have a better idea regarding plan of care after MD appointment tomorrow.   Will continue to follow for potential [facility] care coordination needs. Marthenia Rolling, MSN, RN,BSN [place] Acute Care Coordinator [phone] (Direct dial)

## 2022-07-23 DIAGNOSIS — E8721 Acute metabolic acidosis: Secondary | ICD-10-CM | POA: Diagnosis not present

## 2022-07-23 DIAGNOSIS — Z66 Do not resuscitate: Secondary | ICD-10-CM | POA: Diagnosis not present

## 2022-07-23 DIAGNOSIS — L89159 Pressure ulcer of sacral region, unspecified stage: Secondary | ICD-10-CM | POA: Diagnosis not present

## 2022-07-23 DIAGNOSIS — L89322 Pressure ulcer of left buttock, stage 2: Secondary | ICD-10-CM | POA: Diagnosis not present

## 2022-07-23 DIAGNOSIS — D62 Acute posthemorrhagic anemia: Secondary | ICD-10-CM | POA: Diagnosis not present

## 2022-07-23 DIAGNOSIS — R600 Localized edema: Secondary | ICD-10-CM | POA: Diagnosis not present

## 2022-07-23 DIAGNOSIS — U071 COVID-19: Secondary | ICD-10-CM | POA: Diagnosis not present

## 2022-07-23 DIAGNOSIS — R29898 Other symptoms and signs involving the musculoskeletal system: Secondary | ICD-10-CM | POA: Diagnosis not present

## 2022-07-23 DIAGNOSIS — Z8719 Personal history of other diseases of the digestive system: Secondary | ICD-10-CM | POA: Diagnosis not present

## 2022-07-23 DIAGNOSIS — R0902 Hypoxemia: Secondary | ICD-10-CM | POA: Diagnosis not present

## 2022-07-23 DIAGNOSIS — Z209 Contact with and (suspected) exposure to unspecified communicable disease: Secondary | ICD-10-CM | POA: Diagnosis not present

## 2022-07-23 DIAGNOSIS — L089 Local infection of the skin and subcutaneous tissue, unspecified: Secondary | ICD-10-CM | POA: Diagnosis not present

## 2022-07-23 DIAGNOSIS — I890 Lymphedema, not elsewhere classified: Secondary | ICD-10-CM | POA: Diagnosis not present

## 2022-07-23 DIAGNOSIS — D509 Iron deficiency anemia, unspecified: Secondary | ICD-10-CM | POA: Diagnosis not present

## 2022-07-23 DIAGNOSIS — E440 Moderate protein-calorie malnutrition: Secondary | ICD-10-CM | POA: Diagnosis not present

## 2022-07-23 DIAGNOSIS — D609 Acquired pure red cell aplasia, unspecified: Secondary | ICD-10-CM | POA: Diagnosis not present

## 2022-07-23 DIAGNOSIS — B965 Pseudomonas (aeruginosa) (mallei) (pseudomallei) as the cause of diseases classified elsewhere: Secondary | ICD-10-CM | POA: Diagnosis not present

## 2022-07-23 DIAGNOSIS — R188 Other ascites: Secondary | ICD-10-CM | POA: Diagnosis not present

## 2022-07-23 DIAGNOSIS — F29 Unspecified psychosis not due to a substance or known physiological condition: Secondary | ICD-10-CM | POA: Diagnosis not present

## 2022-07-23 DIAGNOSIS — M1990 Unspecified osteoarthritis, unspecified site: Secondary | ICD-10-CM | POA: Diagnosis not present

## 2022-07-23 DIAGNOSIS — R52 Pain, unspecified: Secondary | ICD-10-CM | POA: Diagnosis not present

## 2022-07-23 DIAGNOSIS — J449 Chronic obstructive pulmonary disease, unspecified: Secondary | ICD-10-CM | POA: Diagnosis not present

## 2022-07-23 DIAGNOSIS — R5381 Other malaise: Secondary | ICD-10-CM | POA: Diagnosis not present

## 2022-07-23 DIAGNOSIS — L89312 Pressure ulcer of right buttock, stage 2: Secondary | ICD-10-CM | POA: Diagnosis not present

## 2022-07-23 DIAGNOSIS — S81802A Unspecified open wound, left lower leg, initial encounter: Secondary | ICD-10-CM | POA: Diagnosis not present

## 2022-07-23 DIAGNOSIS — E43 Unspecified severe protein-calorie malnutrition: Secondary | ICD-10-CM | POA: Diagnosis not present

## 2022-07-23 DIAGNOSIS — Z743 Need for continuous supervision: Secondary | ICD-10-CM | POA: Diagnosis not present

## 2022-07-23 DIAGNOSIS — C9000 Multiple myeloma not having achieved remission: Secondary | ICD-10-CM | POA: Diagnosis not present

## 2022-07-23 DIAGNOSIS — D649 Anemia, unspecified: Secondary | ICD-10-CM | POA: Diagnosis not present

## 2022-07-23 DIAGNOSIS — I96 Gangrene, not elsewhere classified: Secondary | ICD-10-CM | POA: Diagnosis not present

## 2022-07-23 DIAGNOSIS — J9811 Atelectasis: Secondary | ICD-10-CM | POA: Diagnosis not present

## 2022-07-23 DIAGNOSIS — R739 Hyperglycemia, unspecified: Secondary | ICD-10-CM | POA: Diagnosis not present

## 2022-07-23 DIAGNOSIS — N182 Chronic kidney disease, stage 2 (mild): Secondary | ICD-10-CM | POA: Diagnosis not present

## 2022-07-23 DIAGNOSIS — L89892 Pressure ulcer of other site, stage 2: Secondary | ICD-10-CM | POA: Diagnosis not present

## 2022-07-23 DIAGNOSIS — R770 Abnormality of albumin: Secondary | ICD-10-CM | POA: Diagnosis not present

## 2022-07-23 DIAGNOSIS — E785 Hyperlipidemia, unspecified: Secondary | ICD-10-CM | POA: Diagnosis not present

## 2022-07-23 DIAGNOSIS — Z7401 Bed confinement status: Secondary | ICD-10-CM | POA: Diagnosis not present

## 2022-07-23 DIAGNOSIS — M726 Necrotizing fasciitis: Secondary | ICD-10-CM | POA: Diagnosis not present

## 2022-07-23 DIAGNOSIS — R238 Other skin changes: Secondary | ICD-10-CM | POA: Diagnosis not present

## 2022-07-23 DIAGNOSIS — R0600 Dyspnea, unspecified: Secondary | ICD-10-CM | POA: Diagnosis not present

## 2022-07-23 DIAGNOSIS — R410 Disorientation, unspecified: Secondary | ICD-10-CM | POA: Diagnosis not present

## 2022-07-23 DIAGNOSIS — I959 Hypotension, unspecified: Secondary | ICD-10-CM | POA: Diagnosis not present

## 2022-07-23 DIAGNOSIS — E8809 Other disorders of plasma-protein metabolism, not elsewhere classified: Secondary | ICD-10-CM | POA: Diagnosis not present

## 2022-07-23 DIAGNOSIS — F32A Depression, unspecified: Secondary | ICD-10-CM | POA: Diagnosis not present

## 2022-07-23 DIAGNOSIS — L03116 Cellulitis of left lower limb: Secondary | ICD-10-CM | POA: Diagnosis not present

## 2022-07-23 DIAGNOSIS — E039 Hypothyroidism, unspecified: Secondary | ICD-10-CM | POA: Diagnosis not present

## 2022-07-23 DIAGNOSIS — Z8673 Personal history of transient ischemic attack (TIA), and cerebral infarction without residual deficits: Secondary | ICD-10-CM | POA: Diagnosis not present

## 2022-07-23 DIAGNOSIS — M6281 Muscle weakness (generalized): Secondary | ICD-10-CM | POA: Diagnosis not present

## 2022-07-23 DIAGNOSIS — K219 Gastro-esophageal reflux disease without esophagitis: Secondary | ICD-10-CM | POA: Diagnosis not present

## 2022-07-23 DIAGNOSIS — K7460 Unspecified cirrhosis of liver: Secondary | ICD-10-CM | POA: Diagnosis not present

## 2022-07-23 DIAGNOSIS — I129 Hypertensive chronic kidney disease with stage 1 through stage 4 chronic kidney disease, or unspecified chronic kidney disease: Secondary | ICD-10-CM | POA: Diagnosis not present

## 2022-07-23 DIAGNOSIS — S81801A Unspecified open wound, right lower leg, initial encounter: Secondary | ICD-10-CM | POA: Diagnosis not present

## 2022-07-23 DIAGNOSIS — I10 Essential (primary) hypertension: Secondary | ICD-10-CM | POA: Diagnosis not present

## 2022-07-23 DIAGNOSIS — L03115 Cellulitis of right lower limb: Secondary | ICD-10-CM | POA: Diagnosis not present

## 2022-07-23 DIAGNOSIS — L89302 Pressure ulcer of unspecified buttock, stage 2: Secondary | ICD-10-CM | POA: Diagnosis not present

## 2022-07-23 DIAGNOSIS — Y998 Other external cause status: Secondary | ICD-10-CM | POA: Diagnosis not present

## 2022-07-23 DIAGNOSIS — R000 Tachycardia, unspecified: Secondary | ICD-10-CM | POA: Diagnosis not present

## 2022-07-23 DIAGNOSIS — W1839XA Other fall on same level, initial encounter: Secondary | ICD-10-CM | POA: Diagnosis not present

## 2022-07-23 DIAGNOSIS — E876 Hypokalemia: Secondary | ICD-10-CM | POA: Diagnosis not present

## 2022-07-23 DIAGNOSIS — D72829 Elevated white blood cell count, unspecified: Secondary | ICD-10-CM | POA: Diagnosis not present

## 2022-07-24 DIAGNOSIS — I10 Essential (primary) hypertension: Secondary | ICD-10-CM | POA: Diagnosis not present

## 2022-07-24 DIAGNOSIS — R739 Hyperglycemia, unspecified: Secondary | ICD-10-CM | POA: Diagnosis not present

## 2022-07-24 DIAGNOSIS — E8721 Acute metabolic acidosis: Secondary | ICD-10-CM | POA: Diagnosis not present

## 2022-07-24 DIAGNOSIS — J449 Chronic obstructive pulmonary disease, unspecified: Secondary | ICD-10-CM | POA: Diagnosis not present

## 2022-07-24 DIAGNOSIS — S81802A Unspecified open wound, left lower leg, initial encounter: Secondary | ICD-10-CM | POA: Diagnosis not present

## 2022-07-24 DIAGNOSIS — K219 Gastro-esophageal reflux disease without esophagitis: Secondary | ICD-10-CM | POA: Diagnosis not present

## 2022-07-24 DIAGNOSIS — F32A Depression, unspecified: Secondary | ICD-10-CM | POA: Diagnosis not present

## 2022-07-24 DIAGNOSIS — S81801A Unspecified open wound, right lower leg, initial encounter: Secondary | ICD-10-CM | POA: Diagnosis not present

## 2022-07-24 DIAGNOSIS — E039 Hypothyroidism, unspecified: Secondary | ICD-10-CM | POA: Diagnosis not present

## 2022-07-25 DIAGNOSIS — R739 Hyperglycemia, unspecified: Secondary | ICD-10-CM | POA: Diagnosis not present

## 2022-07-25 DIAGNOSIS — F32A Depression, unspecified: Secondary | ICD-10-CM | POA: Diagnosis not present

## 2022-07-25 DIAGNOSIS — K219 Gastro-esophageal reflux disease without esophagitis: Secondary | ICD-10-CM | POA: Diagnosis not present

## 2022-07-25 DIAGNOSIS — Z8719 Personal history of other diseases of the digestive system: Secondary | ICD-10-CM | POA: Diagnosis not present

## 2022-07-25 DIAGNOSIS — E039 Hypothyroidism, unspecified: Secondary | ICD-10-CM | POA: Diagnosis not present

## 2022-07-25 DIAGNOSIS — I10 Essential (primary) hypertension: Secondary | ICD-10-CM | POA: Diagnosis not present

## 2022-07-25 DIAGNOSIS — S81801A Unspecified open wound, right lower leg, initial encounter: Secondary | ICD-10-CM | POA: Diagnosis not present

## 2022-07-25 DIAGNOSIS — J449 Chronic obstructive pulmonary disease, unspecified: Secondary | ICD-10-CM | POA: Diagnosis not present

## 2022-07-25 DIAGNOSIS — E8721 Acute metabolic acidosis: Secondary | ICD-10-CM | POA: Diagnosis not present

## 2022-07-26 DIAGNOSIS — S81801A Unspecified open wound, right lower leg, initial encounter: Secondary | ICD-10-CM | POA: Diagnosis not present

## 2022-07-26 DIAGNOSIS — E039 Hypothyroidism, unspecified: Secondary | ICD-10-CM | POA: Diagnosis not present

## 2022-07-26 DIAGNOSIS — I10 Essential (primary) hypertension: Secondary | ICD-10-CM | POA: Diagnosis not present

## 2022-07-26 DIAGNOSIS — J449 Chronic obstructive pulmonary disease, unspecified: Secondary | ICD-10-CM | POA: Diagnosis not present

## 2022-07-26 DIAGNOSIS — Z8719 Personal history of other diseases of the digestive system: Secondary | ICD-10-CM | POA: Diagnosis not present

## 2022-07-26 DIAGNOSIS — K219 Gastro-esophageal reflux disease without esophagitis: Secondary | ICD-10-CM | POA: Diagnosis not present

## 2022-07-26 DIAGNOSIS — F32A Depression, unspecified: Secondary | ICD-10-CM | POA: Diagnosis not present

## 2022-07-26 DIAGNOSIS — E8721 Acute metabolic acidosis: Secondary | ICD-10-CM | POA: Diagnosis not present

## 2022-07-26 DIAGNOSIS — R739 Hyperglycemia, unspecified: Secondary | ICD-10-CM | POA: Diagnosis not present

## 2022-07-27 DIAGNOSIS — J449 Chronic obstructive pulmonary disease, unspecified: Secondary | ICD-10-CM | POA: Diagnosis not present

## 2022-07-27 DIAGNOSIS — K219 Gastro-esophageal reflux disease without esophagitis: Secondary | ICD-10-CM | POA: Diagnosis not present

## 2022-07-27 DIAGNOSIS — F32A Depression, unspecified: Secondary | ICD-10-CM | POA: Diagnosis not present

## 2022-07-27 DIAGNOSIS — E8721 Acute metabolic acidosis: Secondary | ICD-10-CM | POA: Diagnosis not present

## 2022-07-27 DIAGNOSIS — E039 Hypothyroidism, unspecified: Secondary | ICD-10-CM | POA: Diagnosis not present

## 2022-07-27 DIAGNOSIS — S81801A Unspecified open wound, right lower leg, initial encounter: Secondary | ICD-10-CM | POA: Diagnosis not present

## 2022-07-27 DIAGNOSIS — Z8719 Personal history of other diseases of the digestive system: Secondary | ICD-10-CM | POA: Diagnosis not present

## 2022-07-27 DIAGNOSIS — R739 Hyperglycemia, unspecified: Secondary | ICD-10-CM | POA: Diagnosis not present

## 2022-07-27 DIAGNOSIS — I10 Essential (primary) hypertension: Secondary | ICD-10-CM | POA: Diagnosis not present

## 2022-07-28 ENCOUNTER — Ambulatory Visit: Payer: Medicare Other | Admitting: Oncology

## 2022-07-28 DIAGNOSIS — K219 Gastro-esophageal reflux disease without esophagitis: Secondary | ICD-10-CM | POA: Diagnosis not present

## 2022-07-28 DIAGNOSIS — Z8719 Personal history of other diseases of the digestive system: Secondary | ICD-10-CM | POA: Diagnosis not present

## 2022-07-28 DIAGNOSIS — I10 Essential (primary) hypertension: Secondary | ICD-10-CM | POA: Diagnosis not present

## 2022-07-28 DIAGNOSIS — S81802A Unspecified open wound, left lower leg, initial encounter: Secondary | ICD-10-CM | POA: Diagnosis not present

## 2022-07-28 DIAGNOSIS — S81801A Unspecified open wound, right lower leg, initial encounter: Secondary | ICD-10-CM | POA: Diagnosis not present

## 2022-07-28 DIAGNOSIS — F32A Depression, unspecified: Secondary | ICD-10-CM | POA: Diagnosis not present

## 2022-07-28 DIAGNOSIS — E8721 Acute metabolic acidosis: Secondary | ICD-10-CM | POA: Diagnosis not present

## 2022-07-28 DIAGNOSIS — R739 Hyperglycemia, unspecified: Secondary | ICD-10-CM | POA: Diagnosis not present

## 2022-07-28 DIAGNOSIS — J449 Chronic obstructive pulmonary disease, unspecified: Secondary | ICD-10-CM | POA: Diagnosis not present

## 2022-07-28 DIAGNOSIS — E039 Hypothyroidism, unspecified: Secondary | ICD-10-CM | POA: Diagnosis not present

## 2022-07-28 DIAGNOSIS — D72829 Elevated white blood cell count, unspecified: Secondary | ICD-10-CM | POA: Diagnosis not present

## 2022-07-29 ENCOUNTER — Other Ambulatory Visit: Payer: Self-pay | Admitting: *Deleted

## 2022-07-29 DIAGNOSIS — K219 Gastro-esophageal reflux disease without esophagitis: Secondary | ICD-10-CM | POA: Diagnosis not present

## 2022-07-29 DIAGNOSIS — D609 Acquired pure red cell aplasia, unspecified: Secondary | ICD-10-CM | POA: Diagnosis not present

## 2022-07-29 DIAGNOSIS — C9000 Multiple myeloma not having achieved remission: Secondary | ICD-10-CM | POA: Diagnosis not present

## 2022-07-29 DIAGNOSIS — E039 Hypothyroidism, unspecified: Secondary | ICD-10-CM | POA: Diagnosis not present

## 2022-07-29 DIAGNOSIS — L089 Local infection of the skin and subcutaneous tissue, unspecified: Secondary | ICD-10-CM | POA: Diagnosis not present

## 2022-07-29 DIAGNOSIS — F32A Depression, unspecified: Secondary | ICD-10-CM | POA: Diagnosis not present

## 2022-07-29 DIAGNOSIS — I10 Essential (primary) hypertension: Secondary | ICD-10-CM | POA: Diagnosis not present

## 2022-07-29 DIAGNOSIS — S81801A Unspecified open wound, right lower leg, initial encounter: Secondary | ICD-10-CM | POA: Diagnosis not present

## 2022-07-29 DIAGNOSIS — J449 Chronic obstructive pulmonary disease, unspecified: Secondary | ICD-10-CM | POA: Diagnosis not present

## 2022-07-29 DIAGNOSIS — R770 Abnormality of albumin: Secondary | ICD-10-CM | POA: Diagnosis not present

## 2022-07-29 DIAGNOSIS — Z8719 Personal history of other diseases of the digestive system: Secondary | ICD-10-CM | POA: Diagnosis not present

## 2022-07-29 NOTE — Patient Outreach (Signed)
[facility] Post- Acute Care Coordinator follow up. Per [facility] Mrs. Potenza currently resides in [facility] in [place].  Collaboration with Janett Billow, Conservation officer, nature. Janett Billow reports Mrs. Mcgurn admitted to acute hospital directly from MD appointment last week. Unsure if Mrs. Ovitt will return to [facility].   Marthenia Rolling, MSN, RN,BSN [place] Acute Care Coordinator [phone] (Direct dial)

## 2022-07-30 DIAGNOSIS — C9000 Multiple myeloma not having achieved remission: Secondary | ICD-10-CM | POA: Diagnosis not present

## 2022-07-30 DIAGNOSIS — F32A Depression, unspecified: Secondary | ICD-10-CM | POA: Diagnosis not present

## 2022-07-30 DIAGNOSIS — L89312 Pressure ulcer of right buttock, stage 2: Secondary | ICD-10-CM | POA: Diagnosis not present

## 2022-07-30 DIAGNOSIS — J449 Chronic obstructive pulmonary disease, unspecified: Secondary | ICD-10-CM | POA: Diagnosis not present

## 2022-07-30 DIAGNOSIS — E039 Hypothyroidism, unspecified: Secondary | ICD-10-CM | POA: Diagnosis not present

## 2022-07-30 DIAGNOSIS — S81801A Unspecified open wound, right lower leg, initial encounter: Secondary | ICD-10-CM | POA: Diagnosis not present

## 2022-07-30 DIAGNOSIS — R770 Abnormality of albumin: Secondary | ICD-10-CM | POA: Diagnosis not present

## 2022-07-30 DIAGNOSIS — K219 Gastro-esophageal reflux disease without esophagitis: Secondary | ICD-10-CM | POA: Diagnosis not present

## 2022-07-30 DIAGNOSIS — D509 Iron deficiency anemia, unspecified: Secondary | ICD-10-CM | POA: Diagnosis not present

## 2022-07-30 DIAGNOSIS — L89322 Pressure ulcer of left buttock, stage 2: Secondary | ICD-10-CM | POA: Diagnosis not present

## 2022-07-30 DIAGNOSIS — I10 Essential (primary) hypertension: Secondary | ICD-10-CM | POA: Diagnosis not present

## 2022-07-30 DIAGNOSIS — L089 Local infection of the skin and subcutaneous tissue, unspecified: Secondary | ICD-10-CM | POA: Diagnosis not present

## 2022-07-31 DIAGNOSIS — D509 Iron deficiency anemia, unspecified: Secondary | ICD-10-CM | POA: Diagnosis not present

## 2022-07-31 DIAGNOSIS — S81801A Unspecified open wound, right lower leg, initial encounter: Secondary | ICD-10-CM | POA: Diagnosis not present

## 2022-07-31 DIAGNOSIS — D649 Anemia, unspecified: Secondary | ICD-10-CM | POA: Diagnosis not present

## 2022-07-31 DIAGNOSIS — E039 Hypothyroidism, unspecified: Secondary | ICD-10-CM | POA: Diagnosis not present

## 2022-07-31 DIAGNOSIS — C9000 Multiple myeloma not having achieved remission: Secondary | ICD-10-CM | POA: Diagnosis not present

## 2022-07-31 DIAGNOSIS — L89302 Pressure ulcer of unspecified buttock, stage 2: Secondary | ICD-10-CM | POA: Diagnosis not present

## 2022-07-31 DIAGNOSIS — L89892 Pressure ulcer of other site, stage 2: Secondary | ICD-10-CM | POA: Diagnosis not present

## 2022-07-31 DIAGNOSIS — D72829 Elevated white blood cell count, unspecified: Secondary | ICD-10-CM | POA: Diagnosis not present

## 2022-07-31 DIAGNOSIS — I10 Essential (primary) hypertension: Secondary | ICD-10-CM | POA: Diagnosis not present

## 2022-07-31 DIAGNOSIS — J449 Chronic obstructive pulmonary disease, unspecified: Secondary | ICD-10-CM | POA: Diagnosis not present

## 2022-07-31 DIAGNOSIS — K219 Gastro-esophageal reflux disease without esophagitis: Secondary | ICD-10-CM | POA: Diagnosis not present

## 2022-07-31 DIAGNOSIS — F32A Depression, unspecified: Secondary | ICD-10-CM | POA: Diagnosis not present

## 2022-08-01 DIAGNOSIS — J449 Chronic obstructive pulmonary disease, unspecified: Secondary | ICD-10-CM | POA: Diagnosis not present

## 2022-08-01 DIAGNOSIS — R188 Other ascites: Secondary | ICD-10-CM | POA: Diagnosis not present

## 2022-08-01 DIAGNOSIS — I10 Essential (primary) hypertension: Secondary | ICD-10-CM | POA: Diagnosis not present

## 2022-08-01 DIAGNOSIS — S81801A Unspecified open wound, right lower leg, initial encounter: Secondary | ICD-10-CM | POA: Diagnosis not present

## 2022-08-01 DIAGNOSIS — D509 Iron deficiency anemia, unspecified: Secondary | ICD-10-CM | POA: Diagnosis not present

## 2022-08-01 DIAGNOSIS — D649 Anemia, unspecified: Secondary | ICD-10-CM | POA: Diagnosis not present

## 2022-08-01 DIAGNOSIS — C9000 Multiple myeloma not having achieved remission: Secondary | ICD-10-CM | POA: Diagnosis not present

## 2022-08-01 DIAGNOSIS — E039 Hypothyroidism, unspecified: Secondary | ICD-10-CM | POA: Diagnosis not present

## 2022-08-01 DIAGNOSIS — K219 Gastro-esophageal reflux disease without esophagitis: Secondary | ICD-10-CM | POA: Diagnosis not present

## 2022-08-01 DIAGNOSIS — F32A Depression, unspecified: Secondary | ICD-10-CM | POA: Diagnosis not present

## 2022-08-01 DIAGNOSIS — L89892 Pressure ulcer of other site, stage 2: Secondary | ICD-10-CM | POA: Diagnosis not present

## 2022-08-01 DIAGNOSIS — L89302 Pressure ulcer of unspecified buttock, stage 2: Secondary | ICD-10-CM | POA: Diagnosis not present

## 2022-08-01 DIAGNOSIS — R0600 Dyspnea, unspecified: Secondary | ICD-10-CM | POA: Diagnosis not present

## 2022-08-01 DIAGNOSIS — R0902 Hypoxemia: Secondary | ICD-10-CM | POA: Diagnosis not present

## 2022-08-01 DIAGNOSIS — D72829 Elevated white blood cell count, unspecified: Secondary | ICD-10-CM | POA: Diagnosis not present

## 2022-08-02 DIAGNOSIS — Z6824 Body mass index (BMI) 24.0-24.9, adult: Secondary | ICD-10-CM | POA: Diagnosis not present

## 2022-08-02 DIAGNOSIS — Z87891 Personal history of nicotine dependence: Secondary | ICD-10-CM | POA: Diagnosis not present

## 2022-08-02 DIAGNOSIS — F411 Generalized anxiety disorder: Secondary | ICD-10-CM | POA: Diagnosis not present

## 2022-08-02 DIAGNOSIS — E43 Unspecified severe protein-calorie malnutrition: Secondary | ICD-10-CM | POA: Diagnosis not present

## 2022-08-02 DIAGNOSIS — E039 Hypothyroidism, unspecified: Secondary | ICD-10-CM | POA: Diagnosis not present

## 2022-08-02 DIAGNOSIS — R000 Tachycardia, unspecified: Secondary | ICD-10-CM | POA: Diagnosis not present

## 2022-08-02 DIAGNOSIS — Z9981 Dependence on supplemental oxygen: Secondary | ICD-10-CM | POA: Diagnosis not present

## 2022-08-02 DIAGNOSIS — R627 Adult failure to thrive: Secondary | ICD-10-CM | POA: Diagnosis not present

## 2022-08-02 DIAGNOSIS — I110 Hypertensive heart disease with heart failure: Secondary | ICD-10-CM | POA: Diagnosis not present

## 2022-08-02 DIAGNOSIS — R0989 Other specified symptoms and signs involving the circulatory and respiratory systems: Secondary | ICD-10-CM | POA: Diagnosis not present

## 2022-08-02 DIAGNOSIS — S81801A Unspecified open wound, right lower leg, initial encounter: Secondary | ICD-10-CM | POA: Diagnosis not present

## 2022-08-02 DIAGNOSIS — R5381 Other malaise: Secondary | ICD-10-CM | POA: Diagnosis not present

## 2022-08-02 DIAGNOSIS — L97913 Non-pressure chronic ulcer of unspecified part of right lower leg with necrosis of muscle: Secondary | ICD-10-CM | POA: Diagnosis not present

## 2022-08-02 DIAGNOSIS — U071 COVID-19: Secondary | ICD-10-CM | POA: Diagnosis not present

## 2022-08-02 DIAGNOSIS — T8189XA Other complications of procedures, not elsewhere classified, initial encounter: Secondary | ICD-10-CM | POA: Diagnosis not present

## 2022-08-02 DIAGNOSIS — J449 Chronic obstructive pulmonary disease, unspecified: Secondary | ICD-10-CM | POA: Diagnosis not present

## 2022-08-02 DIAGNOSIS — E8721 Acute metabolic acidosis: Secondary | ICD-10-CM | POA: Diagnosis not present

## 2022-08-02 DIAGNOSIS — G9340 Encephalopathy, unspecified: Secondary | ICD-10-CM | POA: Diagnosis not present

## 2022-08-02 DIAGNOSIS — Z743 Need for continuous supervision: Secondary | ICD-10-CM | POA: Diagnosis not present

## 2022-08-02 DIAGNOSIS — I959 Hypotension, unspecified: Secondary | ICD-10-CM | POA: Diagnosis not present

## 2022-08-02 DIAGNOSIS — L03115 Cellulitis of right lower limb: Secondary | ICD-10-CM | POA: Diagnosis not present

## 2022-08-02 DIAGNOSIS — J1281 Pneumonia due to SARS-associated coronavirus: Secondary | ICD-10-CM | POA: Diagnosis not present

## 2022-08-02 DIAGNOSIS — F32A Depression, unspecified: Secondary | ICD-10-CM | POA: Diagnosis not present

## 2022-08-02 DIAGNOSIS — Z209 Contact with and (suspected) exposure to unspecified communicable disease: Secondary | ICD-10-CM | POA: Diagnosis not present

## 2022-08-02 DIAGNOSIS — L89152 Pressure ulcer of sacral region, stage 2: Secondary | ICD-10-CM | POA: Diagnosis not present

## 2022-08-02 DIAGNOSIS — Z8673 Personal history of transient ischemic attack (TIA), and cerebral infarction without residual deficits: Secondary | ICD-10-CM | POA: Diagnosis not present

## 2022-08-02 DIAGNOSIS — F29 Unspecified psychosis not due to a substance or known physiological condition: Secondary | ICD-10-CM | POA: Diagnosis not present

## 2022-08-02 DIAGNOSIS — J1282 Pneumonia due to coronavirus disease 2019: Secondary | ICD-10-CM | POA: Diagnosis not present

## 2022-08-02 DIAGNOSIS — J9621 Acute and chronic respiratory failure with hypoxia: Secondary | ICD-10-CM | POA: Diagnosis not present

## 2022-08-02 DIAGNOSIS — F331 Major depressive disorder, recurrent, moderate: Secondary | ICD-10-CM | POA: Diagnosis not present

## 2022-08-02 DIAGNOSIS — L89159 Pressure ulcer of sacral region, unspecified stage: Secondary | ICD-10-CM | POA: Diagnosis not present

## 2022-08-02 DIAGNOSIS — I639 Cerebral infarction, unspecified: Secondary | ICD-10-CM | POA: Diagnosis not present

## 2022-08-02 DIAGNOSIS — K208 Other esophagitis without bleeding: Secondary | ICD-10-CM | POA: Diagnosis not present

## 2022-08-02 DIAGNOSIS — Z8616 Personal history of COVID-19: Secondary | ICD-10-CM | POA: Diagnosis not present

## 2022-08-02 DIAGNOSIS — E440 Moderate protein-calorie malnutrition: Secondary | ICD-10-CM | POA: Diagnosis not present

## 2022-08-02 DIAGNOSIS — I10 Essential (primary) hypertension: Secondary | ICD-10-CM | POA: Diagnosis not present

## 2022-08-02 DIAGNOSIS — M726 Necrotizing fasciitis: Secondary | ICD-10-CM | POA: Diagnosis not present

## 2022-08-02 DIAGNOSIS — E46 Unspecified protein-calorie malnutrition: Secondary | ICD-10-CM | POA: Diagnosis not present

## 2022-08-02 DIAGNOSIS — K7460 Unspecified cirrhosis of liver: Secondary | ICD-10-CM | POA: Diagnosis not present

## 2022-08-02 DIAGNOSIS — R531 Weakness: Secondary | ICD-10-CM | POA: Diagnosis not present

## 2022-08-02 DIAGNOSIS — G9341 Metabolic encephalopathy: Secondary | ICD-10-CM | POA: Diagnosis not present

## 2022-08-02 DIAGNOSIS — B965 Pseudomonas (aeruginosa) (mallei) (pseudomallei) as the cause of diseases classified elsewhere: Secondary | ICD-10-CM | POA: Diagnosis not present

## 2022-08-02 DIAGNOSIS — D649 Anemia, unspecified: Secondary | ICD-10-CM | POA: Diagnosis not present

## 2022-08-02 DIAGNOSIS — Z6826 Body mass index (BMI) 26.0-26.9, adult: Secondary | ICD-10-CM | POA: Diagnosis not present

## 2022-08-02 DIAGNOSIS — R410 Disorientation, unspecified: Secondary | ICD-10-CM | POA: Diagnosis not present

## 2022-08-02 DIAGNOSIS — S71109A Unspecified open wound, unspecified thigh, initial encounter: Secondary | ICD-10-CM | POA: Diagnosis not present

## 2022-08-02 DIAGNOSIS — K219 Gastro-esophageal reflux disease without esophagitis: Secondary | ICD-10-CM | POA: Diagnosis not present

## 2022-08-02 DIAGNOSIS — J441 Chronic obstructive pulmonary disease with (acute) exacerbation: Secondary | ICD-10-CM | POA: Diagnosis not present

## 2022-08-02 DIAGNOSIS — K2100 Gastro-esophageal reflux disease with esophagitis, without bleeding: Secondary | ICD-10-CM | POA: Diagnosis not present

## 2022-08-02 DIAGNOSIS — F339 Major depressive disorder, recurrent, unspecified: Secondary | ICD-10-CM | POA: Diagnosis not present

## 2022-08-02 DIAGNOSIS — C9000 Multiple myeloma not having achieved remission: Secondary | ICD-10-CM | POA: Diagnosis not present

## 2022-08-02 DIAGNOSIS — R1310 Dysphagia, unspecified: Secondary | ICD-10-CM | POA: Diagnosis not present

## 2022-08-03 DIAGNOSIS — R000 Tachycardia, unspecified: Secondary | ICD-10-CM

## 2022-08-03 DIAGNOSIS — R0989 Other specified symptoms and signs involving the circulatory and respiratory systems: Secondary | ICD-10-CM | POA: Diagnosis not present

## 2022-08-03 DIAGNOSIS — J449 Chronic obstructive pulmonary disease, unspecified: Secondary | ICD-10-CM | POA: Diagnosis not present

## 2022-08-04 ENCOUNTER — Ambulatory Visit: Payer: Medicare Other

## 2022-08-04 DIAGNOSIS — I63119 Cerebral infarction due to embolism of unspecified vertebral artery: Secondary | ICD-10-CM

## 2022-08-05 LAB — CUP PACEART REMOTE DEVICE CHECK
Date Time Interrogation Session: 20240209231300
Implantable Pulse Generator Implant Date: 20200526

## 2022-08-08 ENCOUNTER — Other Ambulatory Visit: Payer: Self-pay | Admitting: *Deleted

## 2022-08-08 NOTE — Patient Outreach (Signed)
[place] Coordinator follow up.   Per [facility] [name] transitioned to [facility] from inpatient hospital. She did not return to [facility] after all.   Writer will sign of. No identifiable [facility] care coordination needs.  Stephanie Rolling, MSN, RN,BSN [place] Acute Care Coordinator [phone] (Direct dial)

## 2022-08-09 DIAGNOSIS — G9341 Metabolic encephalopathy: Secondary | ICD-10-CM | POA: Diagnosis not present

## 2022-08-09 DIAGNOSIS — E43 Unspecified severe protein-calorie malnutrition: Secondary | ICD-10-CM | POA: Diagnosis not present

## 2022-08-09 DIAGNOSIS — R5381 Other malaise: Secondary | ICD-10-CM | POA: Diagnosis not present

## 2022-08-10 DIAGNOSIS — R5381 Other malaise: Secondary | ICD-10-CM | POA: Diagnosis not present

## 2022-08-10 DIAGNOSIS — E43 Unspecified severe protein-calorie malnutrition: Secondary | ICD-10-CM | POA: Diagnosis not present

## 2022-08-10 DIAGNOSIS — G9341 Metabolic encephalopathy: Secondary | ICD-10-CM | POA: Diagnosis not present

## 2022-08-11 DIAGNOSIS — G9340 Encephalopathy, unspecified: Secondary | ICD-10-CM | POA: Diagnosis not present

## 2022-08-11 DIAGNOSIS — J1281 Pneumonia due to SARS-associated coronavirus: Secondary | ICD-10-CM | POA: Diagnosis not present

## 2022-08-11 DIAGNOSIS — J449 Chronic obstructive pulmonary disease, unspecified: Secondary | ICD-10-CM | POA: Diagnosis not present

## 2022-08-11 DIAGNOSIS — M726 Necrotizing fasciitis: Secondary | ICD-10-CM | POA: Diagnosis not present

## 2022-08-11 NOTE — Progress Notes (Signed)
Carelink Summary Report / Loop Recorder 

## 2022-08-12 ENCOUNTER — Other Ambulatory Visit (HOSPITAL_COMMUNITY): Payer: Self-pay

## 2022-08-12 DIAGNOSIS — R627 Adult failure to thrive: Secondary | ICD-10-CM | POA: Diagnosis not present

## 2022-08-12 DIAGNOSIS — T8189XA Other complications of procedures, not elsewhere classified, initial encounter: Secondary | ICD-10-CM | POA: Diagnosis not present

## 2022-08-12 DIAGNOSIS — K2100 Gastro-esophageal reflux disease with esophagitis, without bleeding: Secondary | ICD-10-CM | POA: Diagnosis not present

## 2022-08-12 DIAGNOSIS — E46 Unspecified protein-calorie malnutrition: Secondary | ICD-10-CM | POA: Diagnosis not present

## 2022-08-13 DIAGNOSIS — M726 Necrotizing fasciitis: Secondary | ICD-10-CM | POA: Diagnosis not present

## 2022-08-13 DIAGNOSIS — J449 Chronic obstructive pulmonary disease, unspecified: Secondary | ICD-10-CM | POA: Diagnosis not present

## 2022-08-13 DIAGNOSIS — J1281 Pneumonia due to SARS-associated coronavirus: Secondary | ICD-10-CM | POA: Diagnosis not present

## 2022-08-13 DIAGNOSIS — E46 Unspecified protein-calorie malnutrition: Secondary | ICD-10-CM | POA: Diagnosis not present

## 2022-08-13 DIAGNOSIS — R627 Adult failure to thrive: Secondary | ICD-10-CM | POA: Diagnosis not present

## 2022-08-13 DIAGNOSIS — T8189XA Other complications of procedures, not elsewhere classified, initial encounter: Secondary | ICD-10-CM | POA: Diagnosis not present

## 2022-08-13 DIAGNOSIS — G9340 Encephalopathy, unspecified: Secondary | ICD-10-CM | POA: Diagnosis not present

## 2022-08-13 DIAGNOSIS — K2100 Gastro-esophageal reflux disease with esophagitis, without bleeding: Secondary | ICD-10-CM | POA: Diagnosis not present

## 2022-08-14 DIAGNOSIS — K2100 Gastro-esophageal reflux disease with esophagitis, without bleeding: Secondary | ICD-10-CM | POA: Diagnosis not present

## 2022-08-14 DIAGNOSIS — R627 Adult failure to thrive: Secondary | ICD-10-CM | POA: Diagnosis not present

## 2022-08-14 DIAGNOSIS — E46 Unspecified protein-calorie malnutrition: Secondary | ICD-10-CM | POA: Diagnosis not present

## 2022-08-14 DIAGNOSIS — T8189XA Other complications of procedures, not elsewhere classified, initial encounter: Secondary | ICD-10-CM | POA: Diagnosis not present

## 2022-08-15 DIAGNOSIS — R627 Adult failure to thrive: Secondary | ICD-10-CM | POA: Diagnosis not present

## 2022-08-15 DIAGNOSIS — T8189XA Other complications of procedures, not elsewhere classified, initial encounter: Secondary | ICD-10-CM | POA: Diagnosis not present

## 2022-08-15 DIAGNOSIS — K2100 Gastro-esophageal reflux disease with esophagitis, without bleeding: Secondary | ICD-10-CM | POA: Diagnosis not present

## 2022-08-15 DIAGNOSIS — E46 Unspecified protein-calorie malnutrition: Secondary | ICD-10-CM | POA: Diagnosis not present

## 2022-08-16 DIAGNOSIS — E440 Moderate protein-calorie malnutrition: Secondary | ICD-10-CM

## 2022-08-16 DIAGNOSIS — L89159 Pressure ulcer of sacral region, unspecified stage: Secondary | ICD-10-CM

## 2022-08-16 DIAGNOSIS — U071 COVID-19: Secondary | ICD-10-CM

## 2022-08-17 DIAGNOSIS — U071 COVID-19: Secondary | ICD-10-CM | POA: Diagnosis not present

## 2022-08-17 DIAGNOSIS — E440 Moderate protein-calorie malnutrition: Secondary | ICD-10-CM | POA: Diagnosis not present

## 2022-08-17 DIAGNOSIS — L89159 Pressure ulcer of sacral region, unspecified stage: Secondary | ICD-10-CM | POA: Diagnosis not present

## 2022-08-18 DIAGNOSIS — G9340 Encephalopathy, unspecified: Secondary | ICD-10-CM | POA: Diagnosis not present

## 2022-08-18 DIAGNOSIS — J1281 Pneumonia due to SARS-associated coronavirus: Secondary | ICD-10-CM | POA: Diagnosis not present

## 2022-08-18 DIAGNOSIS — J449 Chronic obstructive pulmonary disease, unspecified: Secondary | ICD-10-CM | POA: Diagnosis not present

## 2022-08-18 DIAGNOSIS — M726 Necrotizing fasciitis: Secondary | ICD-10-CM | POA: Diagnosis not present

## 2022-08-19 DIAGNOSIS — T8189XA Other complications of procedures, not elsewhere classified, initial encounter: Secondary | ICD-10-CM | POA: Diagnosis not present

## 2022-08-19 DIAGNOSIS — E46 Unspecified protein-calorie malnutrition: Secondary | ICD-10-CM | POA: Diagnosis not present

## 2022-08-19 DIAGNOSIS — R627 Adult failure to thrive: Secondary | ICD-10-CM | POA: Diagnosis not present

## 2022-08-19 DIAGNOSIS — K2100 Gastro-esophageal reflux disease with esophagitis, without bleeding: Secondary | ICD-10-CM | POA: Diagnosis not present

## 2022-08-20 DIAGNOSIS — L97913 Non-pressure chronic ulcer of unspecified part of right lower leg with necrosis of muscle: Secondary | ICD-10-CM | POA: Diagnosis not present

## 2022-08-20 DIAGNOSIS — L89152 Pressure ulcer of sacral region, stage 2: Secondary | ICD-10-CM | POA: Diagnosis not present

## 2022-08-20 DIAGNOSIS — K2100 Gastro-esophageal reflux disease with esophagitis, without bleeding: Secondary | ICD-10-CM | POA: Diagnosis not present

## 2022-08-20 DIAGNOSIS — R627 Adult failure to thrive: Secondary | ICD-10-CM | POA: Diagnosis not present

## 2022-08-20 DIAGNOSIS — C9000 Multiple myeloma not having achieved remission: Secondary | ICD-10-CM | POA: Diagnosis not present

## 2022-08-20 DIAGNOSIS — J1281 Pneumonia due to SARS-associated coronavirus: Secondary | ICD-10-CM | POA: Diagnosis not present

## 2022-08-20 DIAGNOSIS — L03115 Cellulitis of right lower limb: Secondary | ICD-10-CM | POA: Diagnosis not present

## 2022-08-20 DIAGNOSIS — M726 Necrotizing fasciitis: Secondary | ICD-10-CM | POA: Diagnosis not present

## 2022-08-20 DIAGNOSIS — D649 Anemia, unspecified: Secondary | ICD-10-CM | POA: Diagnosis not present

## 2022-08-20 DIAGNOSIS — Z8616 Personal history of COVID-19: Secondary | ICD-10-CM | POA: Diagnosis not present

## 2022-08-20 DIAGNOSIS — Z9981 Dependence on supplemental oxygen: Secondary | ICD-10-CM | POA: Diagnosis not present

## 2022-08-20 DIAGNOSIS — J449 Chronic obstructive pulmonary disease, unspecified: Secondary | ICD-10-CM | POA: Diagnosis not present

## 2022-08-20 DIAGNOSIS — E46 Unspecified protein-calorie malnutrition: Secondary | ICD-10-CM | POA: Diagnosis not present

## 2022-08-20 DIAGNOSIS — T8189XA Other complications of procedures, not elsewhere classified, initial encounter: Secondary | ICD-10-CM | POA: Diagnosis not present

## 2022-08-20 DIAGNOSIS — K7460 Unspecified cirrhosis of liver: Secondary | ICD-10-CM | POA: Diagnosis not present

## 2022-08-20 DIAGNOSIS — E43 Unspecified severe protein-calorie malnutrition: Secondary | ICD-10-CM | POA: Diagnosis not present

## 2022-08-20 DIAGNOSIS — B965 Pseudomonas (aeruginosa) (mallei) (pseudomallei) as the cause of diseases classified elsewhere: Secondary | ICD-10-CM | POA: Diagnosis not present

## 2022-08-20 DIAGNOSIS — G9340 Encephalopathy, unspecified: Secondary | ICD-10-CM | POA: Diagnosis not present

## 2022-08-21 DIAGNOSIS — L03115 Cellulitis of right lower limb: Secondary | ICD-10-CM | POA: Diagnosis not present

## 2022-08-21 DIAGNOSIS — C9000 Multiple myeloma not having achieved remission: Secondary | ICD-10-CM | POA: Diagnosis not present

## 2022-08-21 DIAGNOSIS — B965 Pseudomonas (aeruginosa) (mallei) (pseudomallei) as the cause of diseases classified elsewhere: Secondary | ICD-10-CM | POA: Diagnosis not present

## 2022-08-21 DIAGNOSIS — L97913 Non-pressure chronic ulcer of unspecified part of right lower leg with necrosis of muscle: Secondary | ICD-10-CM | POA: Diagnosis not present

## 2022-08-21 DIAGNOSIS — D649 Anemia, unspecified: Secondary | ICD-10-CM | POA: Diagnosis not present

## 2022-08-21 DIAGNOSIS — L89152 Pressure ulcer of sacral region, stage 2: Secondary | ICD-10-CM | POA: Diagnosis not present

## 2022-08-22 DEATH — deceased

## 2022-09-08 ENCOUNTER — Ambulatory Visit: Payer: Medicare Other

## 2022-09-18 NOTE — Progress Notes (Signed)
Carelink Summary Report / Loop Recorder 

## 2022-09-18 NOTE — Addendum Note (Signed)
Addended by: Douglass Rivers D on: 09/18/2022 01:52 PM   Modules accepted: Level of Service

## 2022-10-13 ENCOUNTER — Ambulatory Visit: Payer: Medicare Other

## 2022-11-18 ENCOUNTER — Ambulatory Visit: Payer: Medicare Other

## 2022-11-30 IMAGING — US US ABDOMEN COMPLETE
2 series · 14 of 25 positions shown · non-contrast
Comparison: 01/19/2018 and prior.

CLINICAL DATA: cirrhosis

EXAM:
ABDOMEN ULTRASOUND COMPLETE

[Series 1: us abdomen complete · 13 of 59 slices shown (1 of 2)]
[im 1/59]
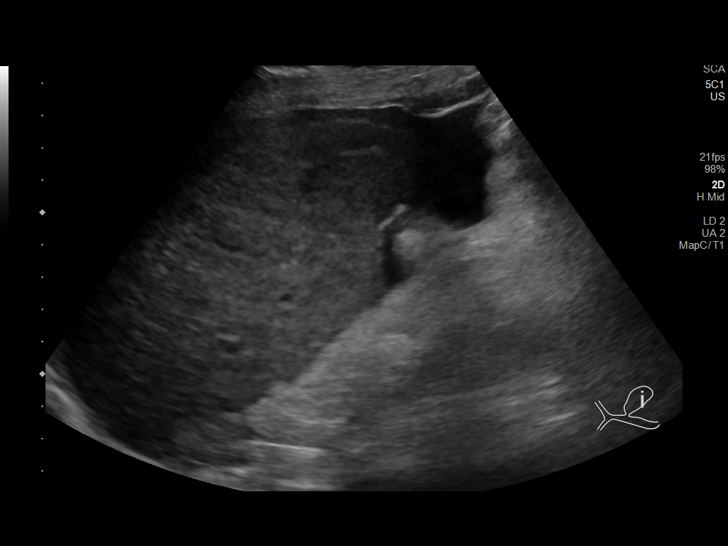
[im 6/59]
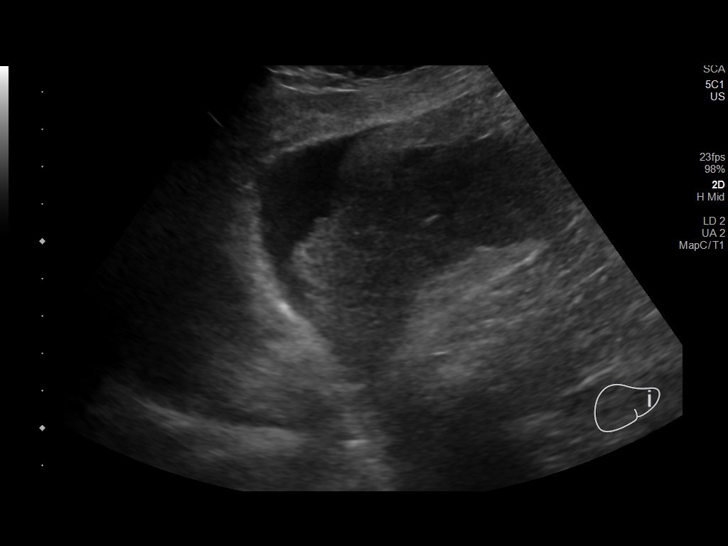
[im 11/59]
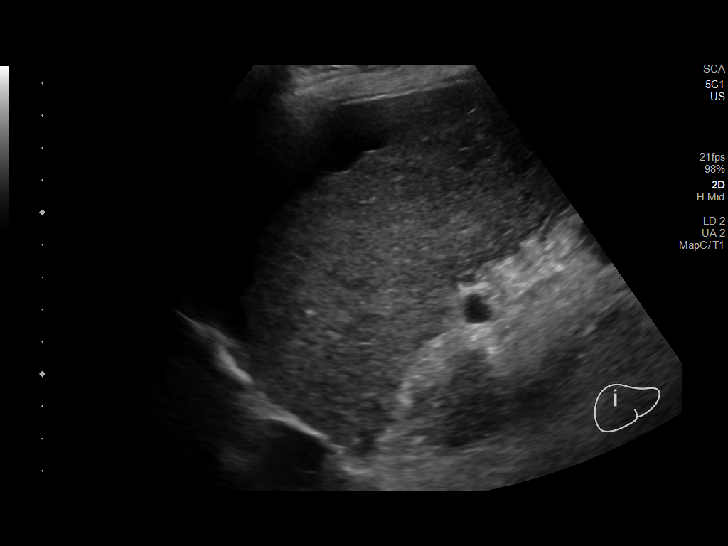
[im 16/59]
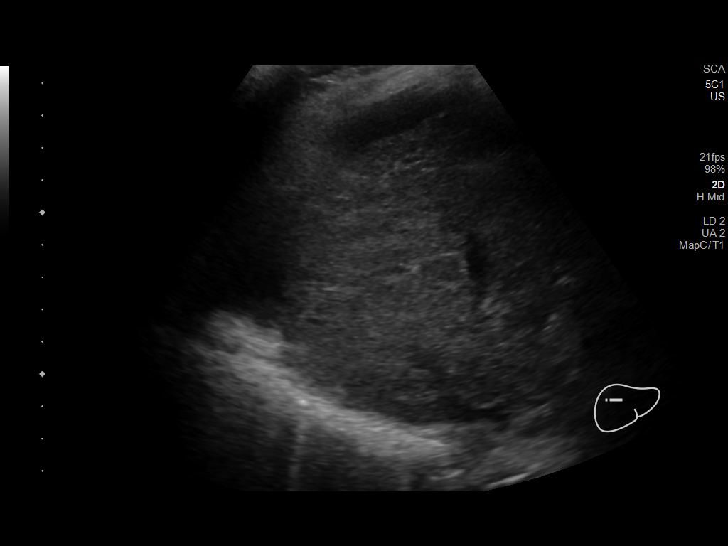
[im 21/59]
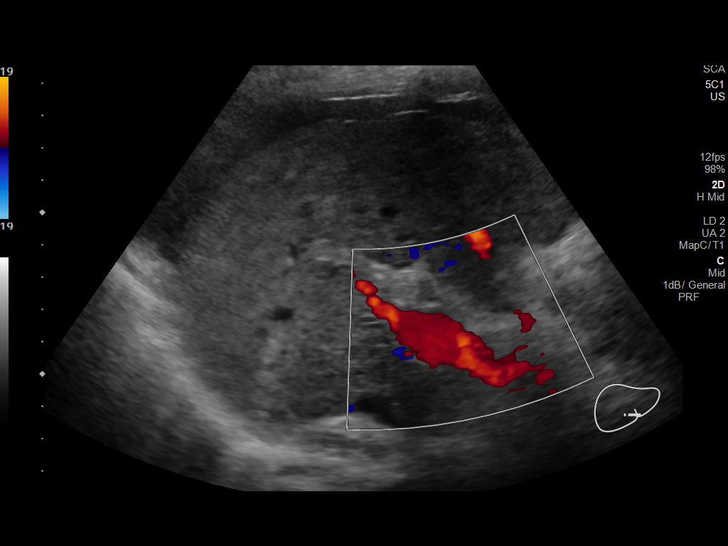
[im 23/59]
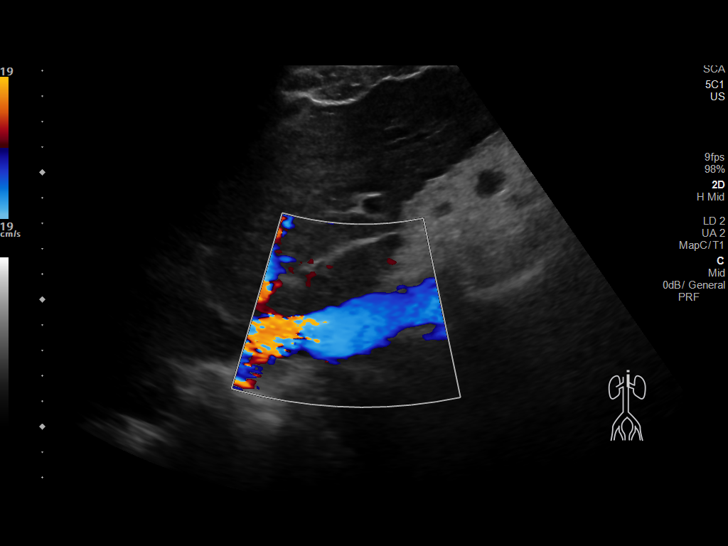
[im 28/59]
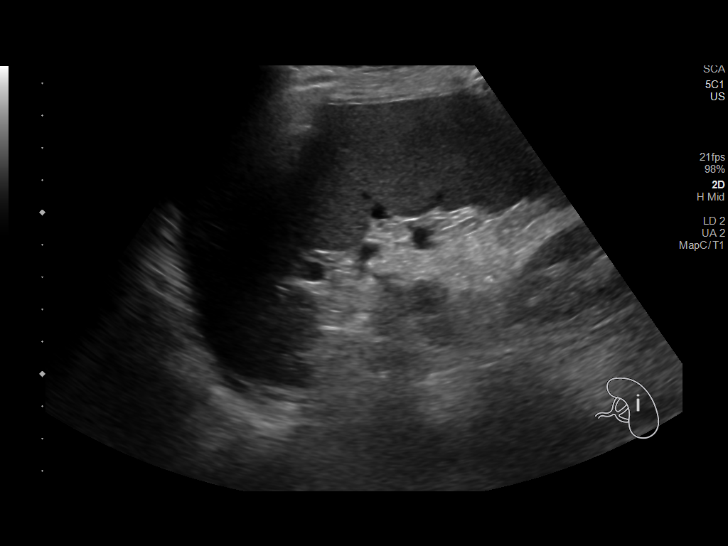
[im 33/59]
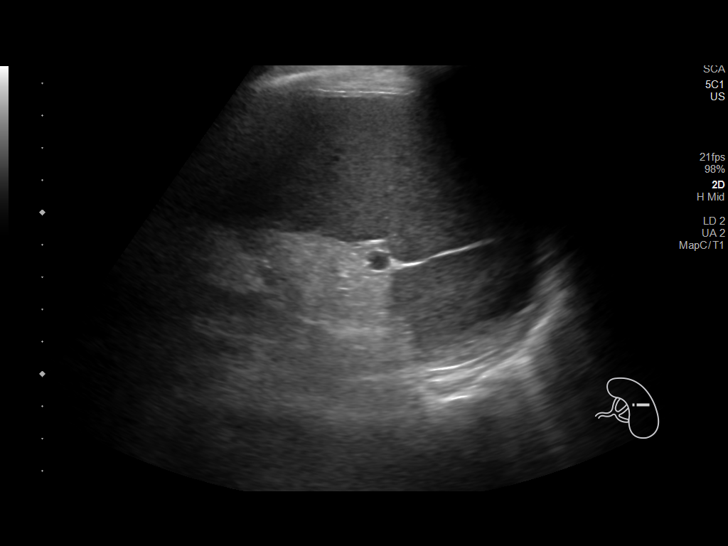
[im 38/59]
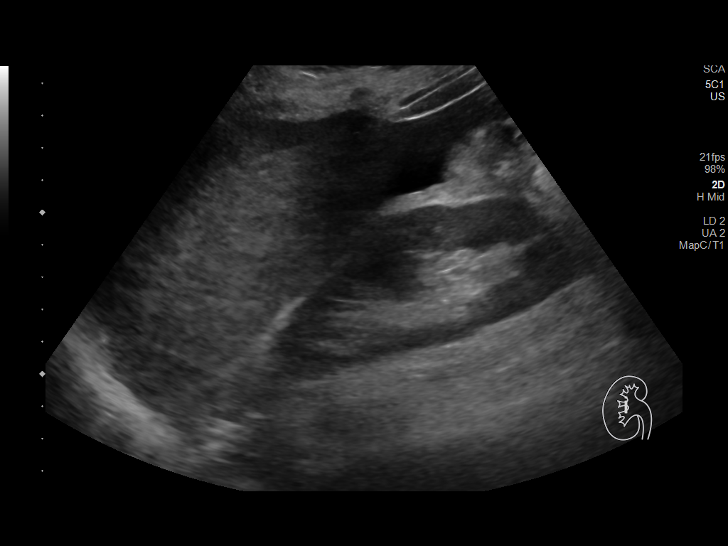
[im 41/59]
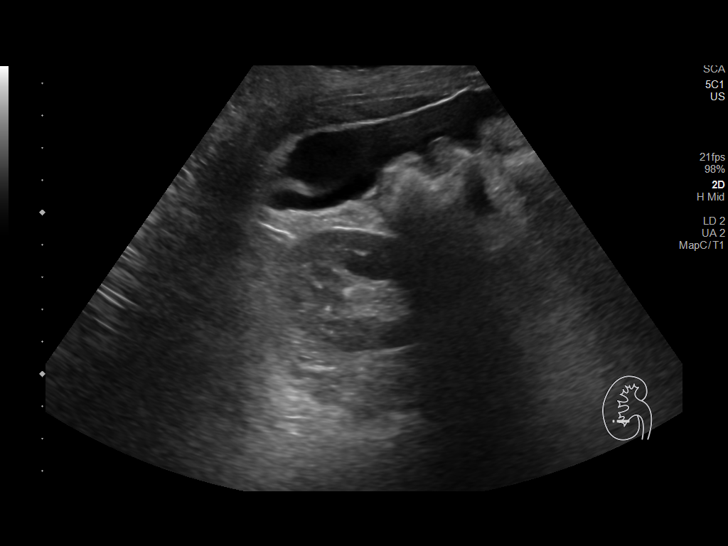
[im 46/59]
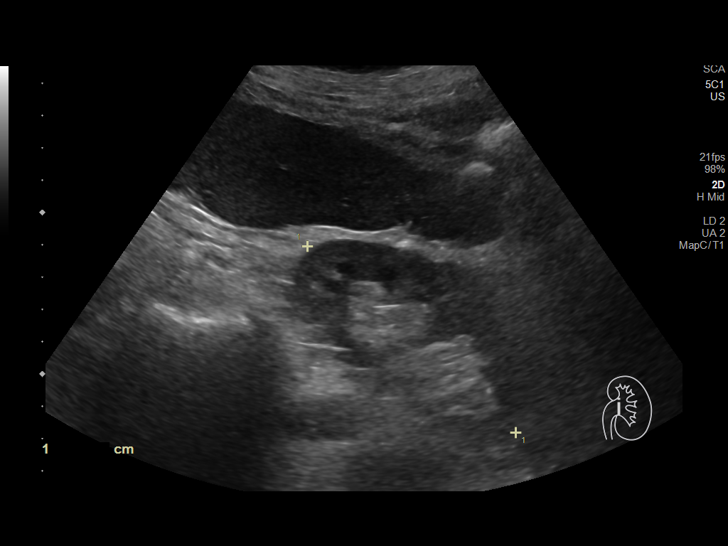
[im 51/59]
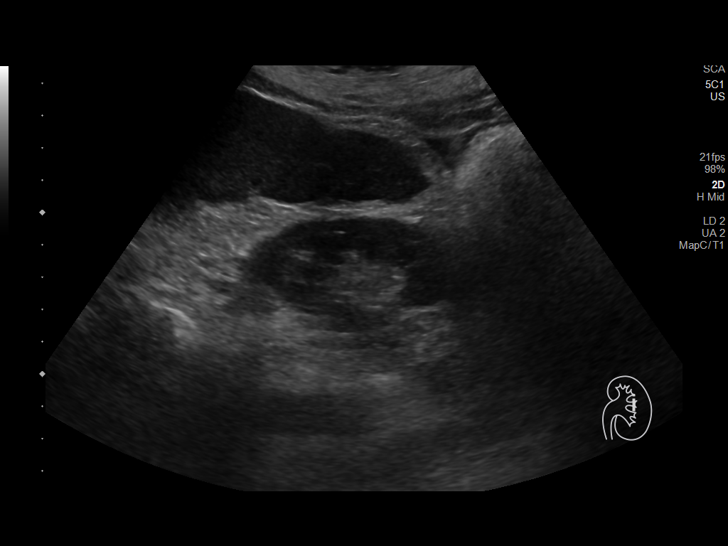
[im 56/59]
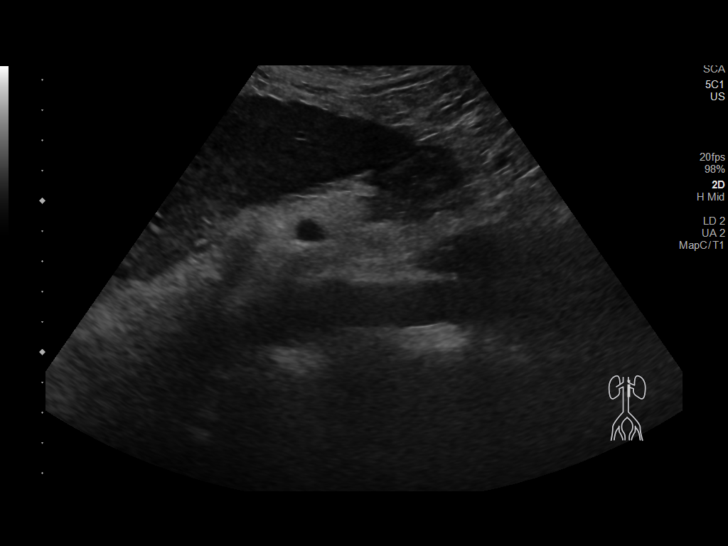

[Series 2: us abdomen complete · 1 of 3 slices shown (2 of 2)]
[im 1/3]
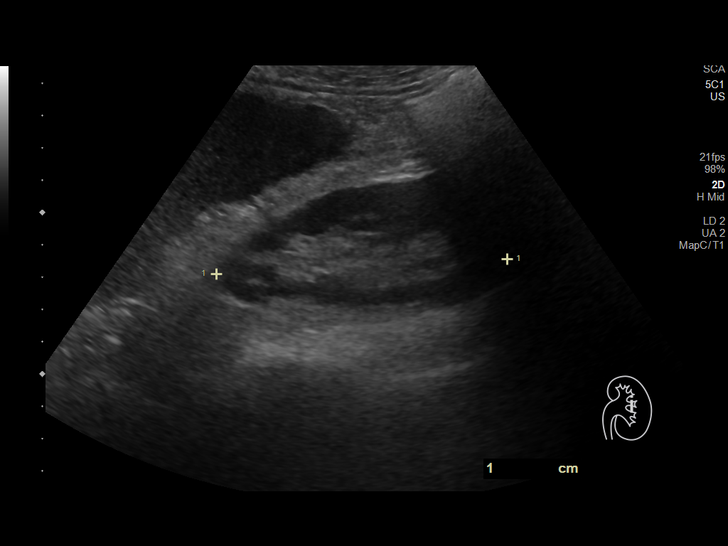

[14 of 25 positions shown; findings below may reference images not displayed]

FINDINGS: Gallbladder: Surgically absent.

Common bile duct: Diameter: 5.1 mm

Liver: No focal lesion identified. Heterogenous, echogenic
parenchyma with nodular contour. Portal vein is patent on color
Doppler imaging with normal direction of blood flow towards the
liver.

IVC: No abnormality visualized.

Pancreas: Visualized portion unremarkable.

Spleen: Prominent measuring 12.4 cm with a volume of 373 mL. Normal
echogenicity.

Right Kidney: Length: 10.0 cm. Echogenicity within normal limits. No
mass or hydronephrosis visualized.

Left Kidney: Length: 9.0 cm. Echogenicity within normal limits. No
mass or hydronephrosis visualized.

Abdominal aorta: No aneurysm visualized.

Other findings: Perihepatic ascites.
IMPRESSION: Cirrhotic liver morphology.  No focal hepatic lesion on ultrasound.

Ascites and mild splenomegaly reflect sequela of portal
hypertension.

Post cholecystectomy sequela.

## 2022-12-22 ENCOUNTER — Ambulatory Visit: Payer: Medicare Other

## 2023-01-26 ENCOUNTER — Ambulatory Visit: Payer: Medicare Other

## 2023-03-02 ENCOUNTER — Ambulatory Visit: Payer: Medicare Other

## 2024-03-05 IMAGING — US US ABDOMEN COMPLETE
1 series · 14 of 25 positions shown · non-contrast
Comparison: May 2020

CLINICAL DATA: Cirrhosis, bloating, abdominal pain

EXAM:
ABDOMEN ULTRASOUND COMPLETE

[Series 1: us abdomen complete · 14 of 66 slices shown]
[im 1/66]
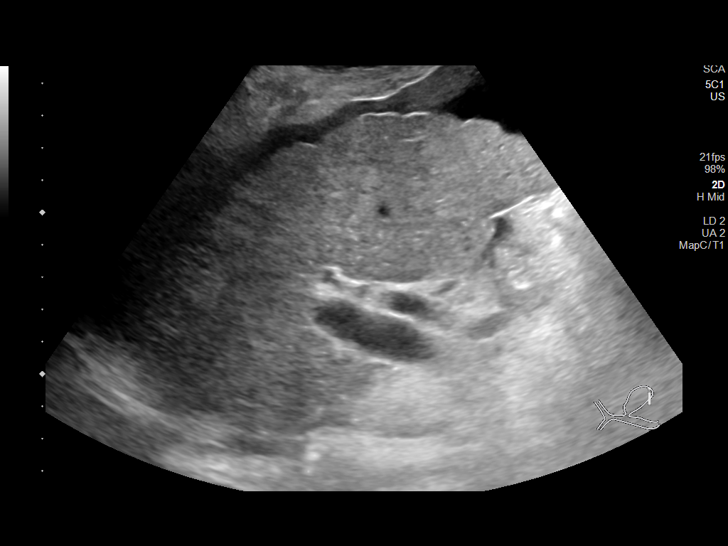
[im 6/66]
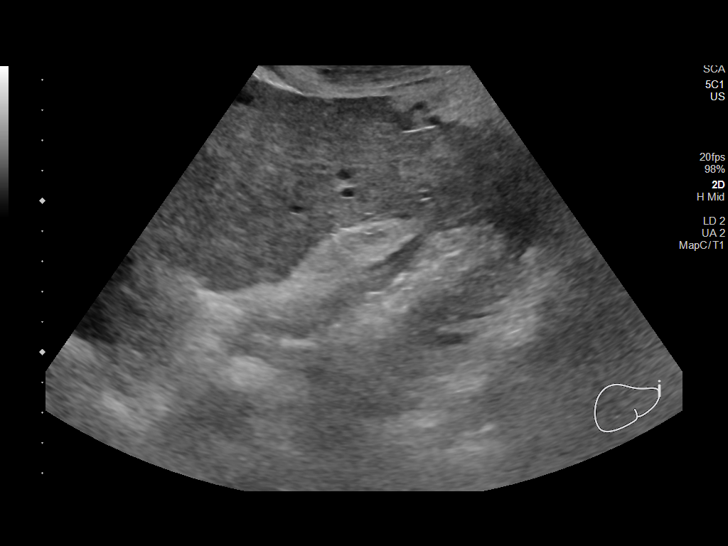
[im 11/66]
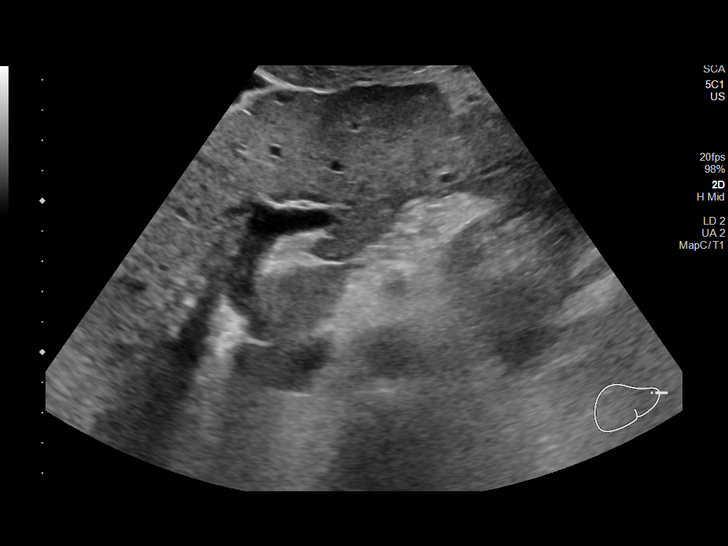
[im 17/66]
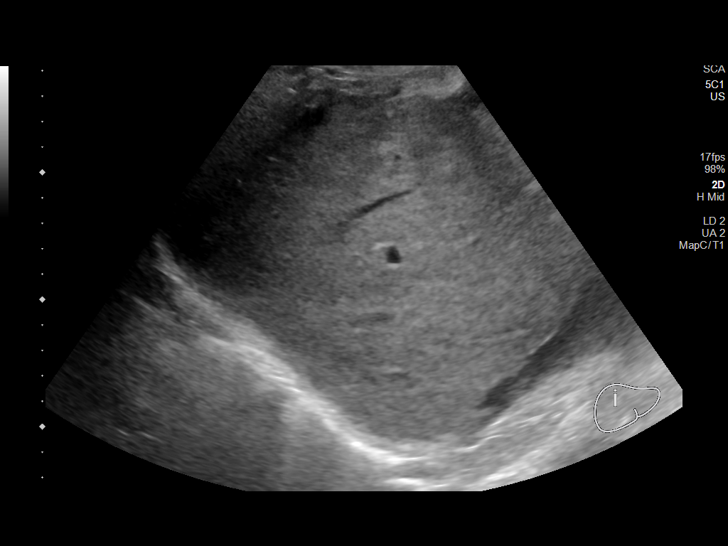
[im 22/66]
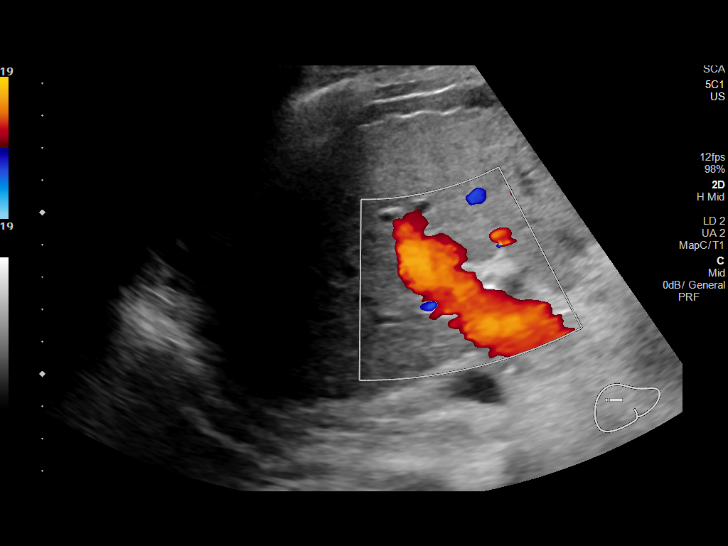
[im 25/66]
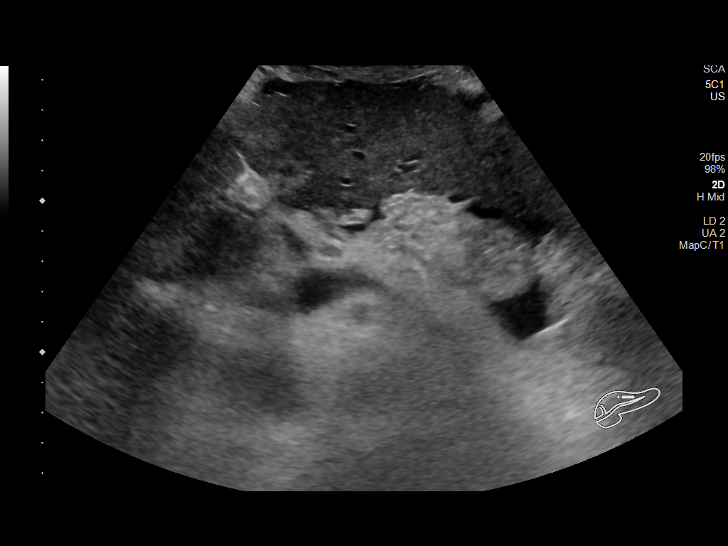
[im 30/66]
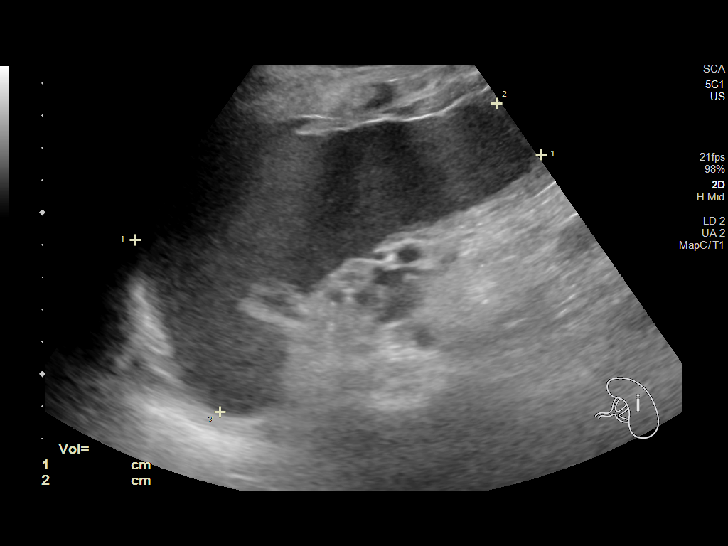
[im 36/66]
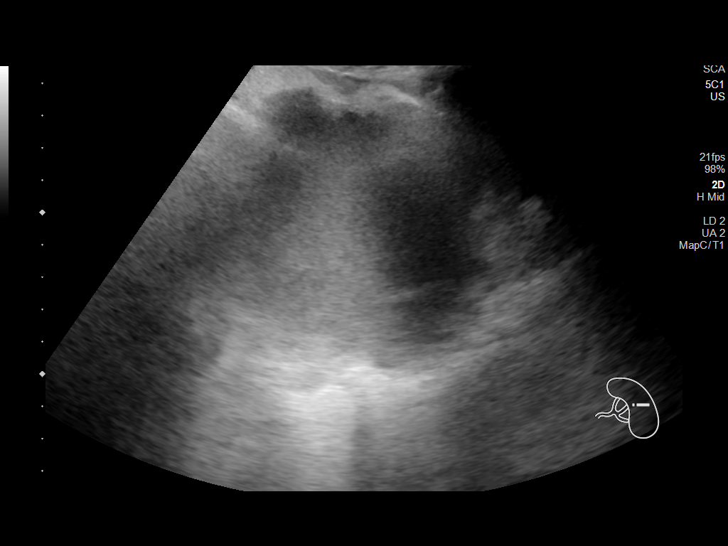
[im 41/66]
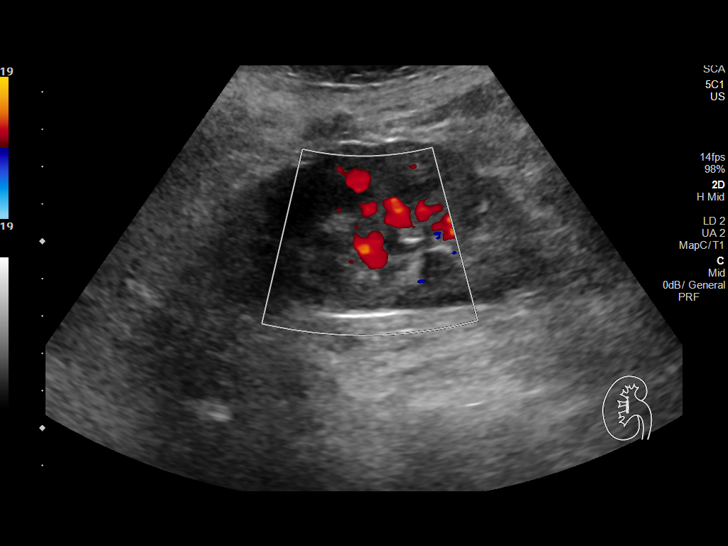
[im 44/66]
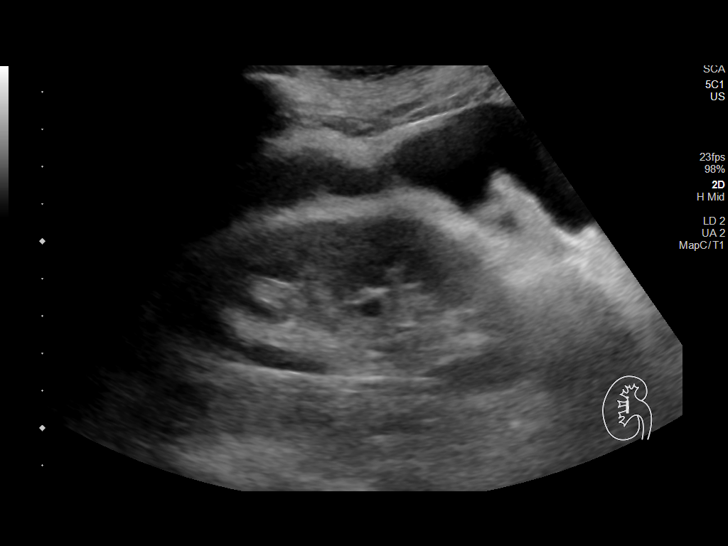
[im 49/66]
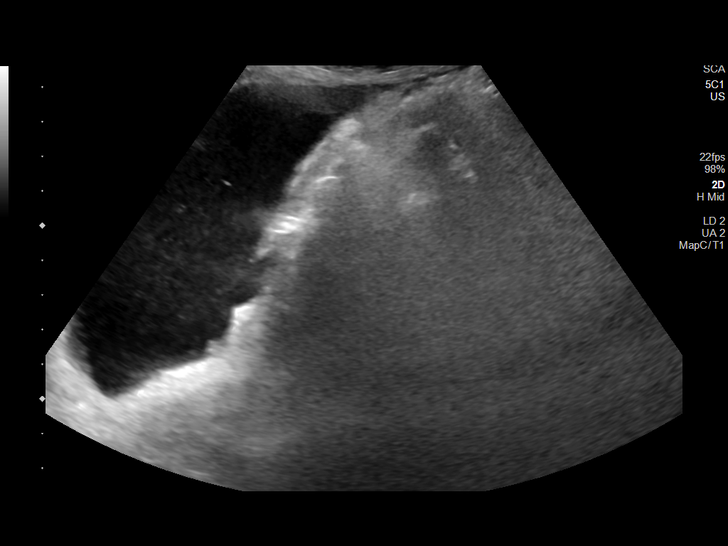
[im 55/66]
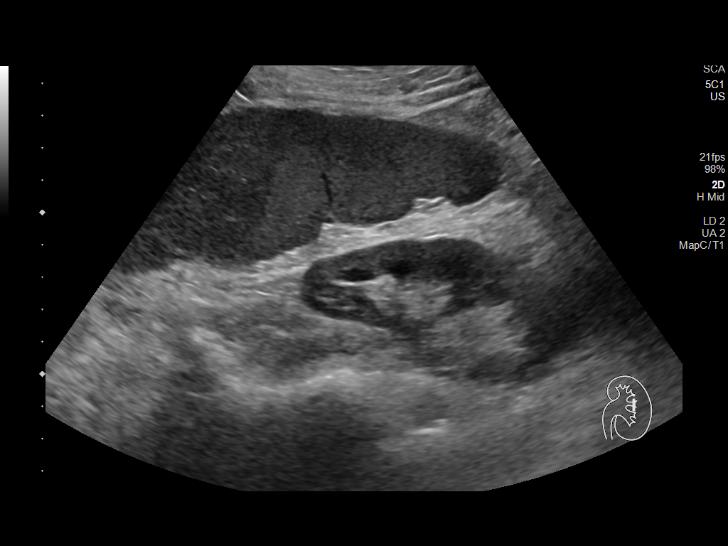
[im 60/66]
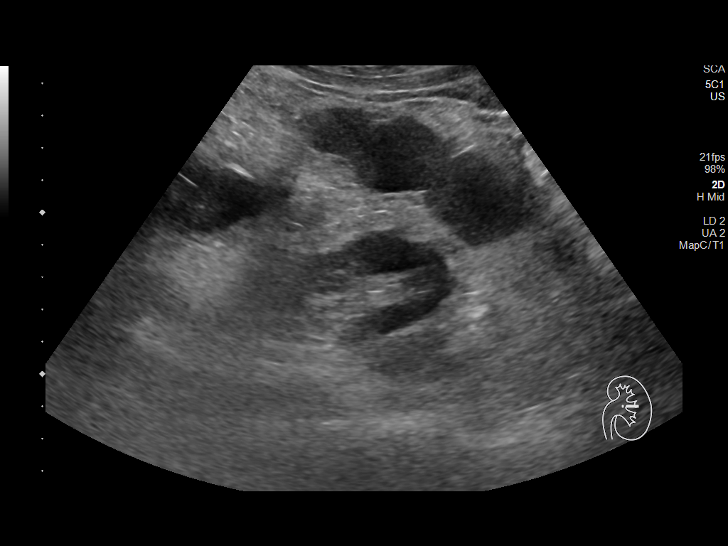
[im 66/66]
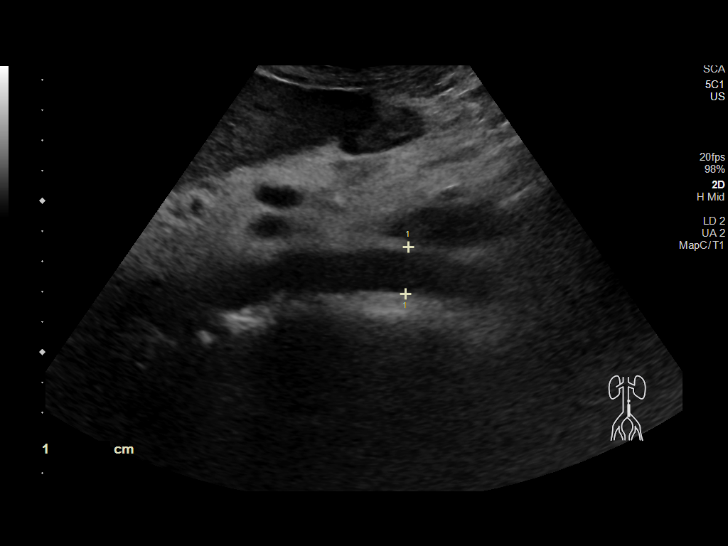

[14 of 25 positions shown; findings below may reference images not displayed]

FINDINGS: Gallbladder: Cholecystectomy.

Common bile duct: Diameter: 7.5 mm, within normal limits

Liver: No focal lesion identified. Increased echogenicity with
heterogeneous appearance. Nodular contour. Portal vein is patent on
color Doppler imaging with normal direction of blood flow towards
the liver.

IVC: No abnormality visualized.

Pancreas: Visualized portion unremarkable.

Spleen: Similar in size.  Remains prominent measuring 12.8 cm.

Right Kidney: Length: 9.6 cm. Echogenicity within normal limits. No
mass or hydronephrosis visualized.

Left Kidney: Length: 8.2 cm. Echogenicity within normal limits. No
mass or hydronephrosis visualized.

Abdominal aorta: No aneurysm visualized. The bifurcation is obscured
by bowel gas.

Other findings: Ascites.
IMPRESSION: Cirrhosis with evidence of portal hypertension including ascites. No
focal liver lesion.
